# Patient Record
Sex: Female | Born: 1950 | Race: White | Hispanic: No | Smoking: Current every day smoker
Health system: Southern US, Community
[De-identification: ages and names within clinical notes are randomized; demographics above are authoritative.]

## PROBLEM LIST (undated history)

## (undated) DIAGNOSIS — J449 Chronic obstructive pulmonary disease, unspecified: Secondary | ICD-10-CM

## (undated) DIAGNOSIS — E119 Type 2 diabetes mellitus without complications: Secondary | ICD-10-CM

## (undated) DIAGNOSIS — M810 Age-related osteoporosis without current pathological fracture: Secondary | ICD-10-CM

## (undated) DIAGNOSIS — C801 Malignant (primary) neoplasm, unspecified: Secondary | ICD-10-CM

## (undated) DIAGNOSIS — I10 Essential (primary) hypertension: Secondary | ICD-10-CM

## (undated) HISTORY — PX: COLONOSCOPY: SHX174

## (undated) HISTORY — PX: BUNIONECTOMY: SHX129

## (undated) HISTORY — DX: Type 2 diabetes mellitus without complications: E11.9

## (undated) HISTORY — PX: TUBAL LIGATION: SHX77

## (undated) HISTORY — DX: Age-related osteoporosis without current pathological fracture: M81.0

## (undated) HISTORY — DX: Chronic obstructive pulmonary disease, unspecified: J44.9

## (undated) HISTORY — PX: BREAST LUMPECTOMY: SHX2

## (undated) HISTORY — DX: Essential (primary) hypertension: I10

---

## 2004-08-16 ENCOUNTER — Ambulatory Visit: Payer: Self-pay | Admitting: Internal Medicine

## 2004-08-30 ENCOUNTER — Ambulatory Visit: Payer: Self-pay | Admitting: General Surgery

## 2004-08-31 ENCOUNTER — Ambulatory Visit: Payer: Self-pay | Admitting: Internal Medicine

## 2004-10-12 ENCOUNTER — Ambulatory Visit: Payer: Self-pay | Admitting: Radiation Oncology

## 2004-12-20 ENCOUNTER — Ambulatory Visit: Payer: Self-pay | Admitting: Internal Medicine

## 2004-12-30 ENCOUNTER — Ambulatory Visit: Payer: Self-pay | Admitting: Internal Medicine

## 2005-03-21 ENCOUNTER — Ambulatory Visit: Payer: Self-pay | Admitting: General Surgery

## 2005-05-08 ENCOUNTER — Ambulatory Visit: Payer: Self-pay | Admitting: Internal Medicine

## 2005-06-01 ENCOUNTER — Ambulatory Visit: Payer: Self-pay | Admitting: Internal Medicine

## 2005-07-09 ENCOUNTER — Ambulatory Visit: Payer: Self-pay | Admitting: Family Medicine

## 2005-09-07 ENCOUNTER — Ambulatory Visit: Payer: Self-pay | Admitting: General Surgery

## 2005-10-11 ENCOUNTER — Ambulatory Visit: Payer: Self-pay | Admitting: Internal Medicine

## 2005-11-12 ENCOUNTER — Ambulatory Visit: Payer: Self-pay | Admitting: Internal Medicine

## 2005-11-29 ENCOUNTER — Ambulatory Visit: Payer: Self-pay | Admitting: Internal Medicine

## 2006-05-20 ENCOUNTER — Ambulatory Visit: Payer: Self-pay | Admitting: Internal Medicine

## 2006-06-01 ENCOUNTER — Ambulatory Visit: Payer: Self-pay | Admitting: Internal Medicine

## 2006-10-10 ENCOUNTER — Ambulatory Visit: Payer: Self-pay | Admitting: Radiation Oncology

## 2006-10-21 ENCOUNTER — Ambulatory Visit: Payer: Self-pay | Admitting: General Surgery

## 2006-11-18 ENCOUNTER — Ambulatory Visit: Payer: Self-pay | Admitting: Internal Medicine

## 2006-11-25 ENCOUNTER — Ambulatory Visit: Payer: Self-pay | Admitting: Urology

## 2006-11-30 ENCOUNTER — Ambulatory Visit: Payer: Self-pay | Admitting: Internal Medicine

## 2007-07-02 ENCOUNTER — Ambulatory Visit: Payer: Self-pay | Admitting: Internal Medicine

## 2007-07-16 ENCOUNTER — Ambulatory Visit: Payer: Self-pay | Admitting: Internal Medicine

## 2007-08-02 ENCOUNTER — Ambulatory Visit: Payer: Self-pay | Admitting: Internal Medicine

## 2007-12-10 ENCOUNTER — Ambulatory Visit: Payer: Self-pay | Admitting: Family Medicine

## 2007-12-12 ENCOUNTER — Ambulatory Visit: Payer: Self-pay | Admitting: Family Medicine

## 2007-12-16 ENCOUNTER — Ambulatory Visit: Payer: Self-pay | Admitting: Gastroenterology

## 2008-01-30 ENCOUNTER — Ambulatory Visit: Payer: Self-pay | Admitting: Internal Medicine

## 2008-02-26 ENCOUNTER — Ambulatory Visit: Payer: Self-pay | Admitting: Internal Medicine

## 2008-03-01 ENCOUNTER — Ambulatory Visit: Payer: Self-pay | Admitting: Internal Medicine

## 2008-05-06 ENCOUNTER — Ambulatory Visit: Payer: Self-pay | Admitting: Internal Medicine

## 2008-05-28 ENCOUNTER — Ambulatory Visit: Payer: Self-pay | Admitting: Family Medicine

## 2008-11-01 ENCOUNTER — Ambulatory Visit: Payer: Self-pay | Admitting: Internal Medicine

## 2008-11-25 ENCOUNTER — Ambulatory Visit: Payer: Self-pay | Admitting: Internal Medicine

## 2008-11-29 ENCOUNTER — Ambulatory Visit: Payer: Self-pay | Admitting: Internal Medicine

## 2009-01-12 ENCOUNTER — Ambulatory Visit: Payer: Self-pay | Admitting: Internal Medicine

## 2009-07-25 ENCOUNTER — Ambulatory Visit: Payer: Self-pay | Admitting: Family Medicine

## 2010-02-24 ENCOUNTER — Ambulatory Visit: Payer: Self-pay | Admitting: Family Medicine

## 2010-03-09 ENCOUNTER — Ambulatory Visit: Payer: Self-pay | Admitting: Family Medicine

## 2010-07-02 ENCOUNTER — Ambulatory Visit: Payer: Self-pay | Admitting: Family Medicine

## 2011-06-02 ENCOUNTER — Ambulatory Visit: Payer: Self-pay | Admitting: Internal Medicine

## 2011-06-13 ENCOUNTER — Ambulatory Visit: Payer: Self-pay | Admitting: Family Medicine

## 2011-07-14 ENCOUNTER — Ambulatory Visit: Payer: Self-pay

## 2011-10-15 ENCOUNTER — Ambulatory Visit: Payer: Self-pay | Admitting: Family Medicine

## 2012-01-11 ENCOUNTER — Ambulatory Visit: Payer: Self-pay | Admitting: Family Medicine

## 2013-11-10 ENCOUNTER — Ambulatory Visit: Payer: Self-pay | Admitting: Family Medicine

## 2015-05-19 ENCOUNTER — Encounter: Payer: Self-pay | Admitting: Family Medicine

## 2015-05-19 ENCOUNTER — Ambulatory Visit (INDEPENDENT_AMBULATORY_CARE_PROVIDER_SITE_OTHER): Payer: PRIVATE HEALTH INSURANCE | Admitting: Family Medicine

## 2015-05-19 VITALS — BP 136/88 | HR 80 | Ht 64.0 in | Wt 72.0 lb

## 2015-05-19 DIAGNOSIS — R7303 Prediabetes: Secondary | ICD-10-CM

## 2015-05-19 DIAGNOSIS — M810 Age-related osteoporosis without current pathological fracture: Secondary | ICD-10-CM

## 2015-05-19 DIAGNOSIS — J449 Chronic obstructive pulmonary disease, unspecified: Secondary | ICD-10-CM | POA: Diagnosis not present

## 2015-05-19 DIAGNOSIS — R7309 Other abnormal glucose: Secondary | ICD-10-CM | POA: Diagnosis not present

## 2015-05-19 DIAGNOSIS — I10 Essential (primary) hypertension: Secondary | ICD-10-CM | POA: Diagnosis not present

## 2015-05-19 MED ORDER — ALENDRONATE SODIUM 70 MG PO TABS: 70.0000 mg | ORAL_TABLET | ORAL | Status: DC

## 2015-05-19 MED ORDER — TIOTROPIUM BROMIDE MONOHYDRATE 18 MCG IN CAPS: 18.0000 ug | ORAL_CAPSULE | RESPIRATORY_TRACT | Status: DC

## 2015-05-19 MED ORDER — LISINOPRIL-HYDROCHLOROTHIAZIDE 10-12.5 MG PO TABS: 1.0000 | ORAL_TABLET | Freq: Every day | ORAL | Status: DC

## 2015-05-19 MED ORDER — METFORMIN HCL 500 MG PO TABS: 500.0000 mg | ORAL_TABLET | Freq: Every day | ORAL | Status: DC

## 2015-05-19 NOTE — Patient Instructions (Signed)
Smoking Cessation Quitting smoking is important to your health and has many advantages. However, it is not always easy to quit since nicotine is a very addictive drug. Oftentimes, people try 3 times or more before being able to quit. This document explains the best ways for you to prepare to quit smoking. Quitting takes hard work and a lot of effort, but you can do it. ADVANTAGES OF QUITTING SMOKING  You will live longer, feel better, and live better.  Your body will feel the impact of quitting smoking almost immediately.  Within 20 minutes, blood pressure decreases. Your pulse returns to its normal level.  After 8 hours, carbon monoxide levels in the blood return to normal. Your oxygen level increases.  After 24 hours, the chance of having a heart attack starts to decrease. Your breath, hair, and body stop smelling like smoke.  After 48 hours, damaged nerve endings begin to recover. Your sense of taste and smell improve.  After 72 hours, the body is virtually free of nicotine. Your bronchial tubes relax and breathing becomes easier.  After 2 to 12 weeks, lungs can hold more air. Exercise becomes easier and circulation improves.  The risk of having a heart attack, stroke, cancer, or lung disease is greatly reduced.  After 1 year, the risk of coronary heart disease is cut in half.  After 5 years, the risk of stroke falls to the same as a nonsmoker.  After 10 years, the risk of lung cancer is cut in half and the risk of other cancers decreases significantly.  After 15 years, the risk of coronary heart disease drops, usually to the level of a nonsmoker.  If you are pregnant, quitting smoking will improve your chances of having a healthy baby.  The people you live with, especially any children, will be healthier.  You will have extra money to spend on things other than cigarettes. QUESTIONS TO THINK ABOUT BEFORE ATTEMPTING TO QUIT You may want to talk about your answers with your  health care provider.  Why do you want to quit?  If you tried to quit in the past, what helped and what did not?  What will be the most difficult situations for you after you quit? How will you plan to handle them?  Who can help you through the tough times? Your family? Friends? A health care provider?  What pleasures do you get from smoking? What ways can you still get pleasure if you quit? Here are some questions to ask your health care provider:  How can you help me to be successful at quitting?  What medicine do you think would be best for me and how should I take it?  What should I do if I need more help?  What is smoking withdrawal like? How can I get information on withdrawal? GET READY  Set a quit date.  Change your environment by getting rid of all cigarettes, ashtrays, matches, and lighters in your home, car, or work. Do not let people smoke in your home.  Review your past attempts to quit. Think about what worked and what did not. GET SUPPORT AND ENCOURAGEMENT You have a better chance of being successful if you have help. You can get support in many ways.  Tell your family, friends, and coworkers that you are going to quit and need their support. Ask them not to smoke around you.  Get individual, group, or telephone counseling and support. Programs are available at local hospitals and health centers. Call   your local health department for information about programs in your area.  Spiritual beliefs and practices may help some smokers quit.  Download a "quit meter" on your computer to keep track of quit statistics, such as how long you have gone without smoking, cigarettes not smoked, and money saved.  Get a self-help book about quitting smoking and staying off tobacco. LEARN NEW SKILLS AND BEHAVIORS  Distract yourself from urges to smoke. Talk to someone, go for a walk, or occupy your time with a task.  Change your normal routine. Take a different route to work.  Drink tea instead of coffee. Eat breakfast in a different place.  Reduce your stress. Take a hot bath, exercise, or read a book.  Plan something enjoyable to do every day. Reward yourself for not smoking.  Explore interactive web-based programs that specialize in helping you quit. GET MEDICINE AND USE IT CORRECTLY Medicines can help you stop smoking and decrease the urge to smoke. Combining medicine with the above behavioral methods and support can greatly increase your chances of successfully quitting smoking.  Nicotine replacement therapy helps deliver nicotine to your body without the negative effects and risks of smoking. Nicotine replacement therapy includes nicotine gum, lozenges, inhalers, nasal sprays, and skin patches. Some may be available over-the-counter and others require a prescription.  Antidepressant medicine helps people abstain from smoking, but how this works is unknown. This medicine is available by prescription.  Nicotinic receptor partial agonist medicine simulates the effect of nicotine in your brain. This medicine is available by prescription. Ask your health care provider for advice about which medicines to use and how to use them based on your health history. Your health care provider will tell you what side effects to look out for if you choose to be on a medicine or therapy. Carefully read the information on the package. Do not use any other product containing nicotine while using a nicotine replacement product.  RELAPSE OR DIFFICULT SITUATIONS Most relapses occur within the first 3 months after quitting. Do not be discouraged if you start smoking again. Remember, most people try several times before finally quitting. You may have symptoms of withdrawal because your body is used to nicotine. You may crave cigarettes, be irritable, feel very hungry, cough often, get headaches, or have difficulty concentrating. The withdrawal symptoms are only temporary. They are strongest  when you first quit, but they will go away within 10-14 days. To reduce the chances of relapse, try to:  Avoid drinking alcohol. Drinking lowers your chances of successfully quitting.  Reduce the amount of caffeine you consume. Once you quit smoking, the amount of caffeine in your body increases and can give you symptoms, such as a rapid heartbeat, sweating, and anxiety.  Avoid smokers because they can make you want to smoke.  Do not let weight gain distract you. Many smokers will gain weight when they quit, usually less than 10 pounds. Eat a healthy diet and stay active. You can always lose the weight gained after you quit.  Find ways to improve your mood other than smoking. FOR MORE INFORMATION  www.smokefree.gov  Document Released: 09/11/2001 Document Revised: 02/01/2014 Document Reviewed: 12/27/2011 [employer] Patient Information 2015 [employer]. This information is not intended to replace advice given to you by your health care provider. Make sure you discuss any questions you have with your health care provider.  

## 2015-05-19 NOTE — Progress Notes (Signed)
Name: Lindsey Nguyen   MRN: [id number]    DOB: October 03, 1950   Date:05/19/2015       Progress Note  Subjective  Chief Complaint  Chief Complaint  Patient presents with  . Diabetes  . Hypertension  . Osteoporosis    Diabetes She presents for her follow-up diabetic visit. She has type 2 diabetes mellitus. Her disease course has been stable. Pertinent negatives for hypoglycemia include no confusion, dizziness, headaches, hunger, mood changes, nervousness/anxiousness, pallor, seizures, sleepiness, speech difficulty, sweats or tremors. Pertinent negatives for diabetes include no blurred vision, no chest pain, no fatigue, no foot paresthesias, no foot ulcerations, no polydipsia, no polyphagia, no polyuria, no visual change, no weakness and no weight loss. Pertinent negatives for hypoglycemia complications include no blackouts, no hospitalization, no nocturnal hypoglycemia, no required assistance and no required glucagon injection. Symptoms are stable. Pertinent negatives for diabetic complications include no autonomic neuropathy, CVA, heart disease, impotence, nephropathy, peripheral neuropathy, PVD or retinopathy. There are no known risk factors for coronary artery disease. Current diabetic treatment includes oral agent (monotherapy). She is compliant with treatment all of the time. Her weight is stable. When asked about meal planning, she reported none. She has not had a previous visit with a dietitian.  Hypertension Pertinent negatives include no blurred vision, chest pain, headaches, malaise/fatigue, neck pain, palpitations, shortness of breath or sweats. There is no history of CVA, PVD or retinopathy.    No problem-specific assessment & plan notes found for this encounter.   Past Medical History  Diagnosis Date  . Diabetes mellitus without complication   . Hypertension   . Osteoporosis   . COPD (chronic obstructive pulmonary disease)     Past Surgical History  Procedure Laterality Date  .  Breast lumpectomy      x 2  . Bunionectomy Right   . Tubal ligation    . Colonoscopy      normal    Family History  Problem Relation Age of Onset  . Cancer Father   . Diabetes Father   . Cancer Brother   . Diabetes Paternal Aunt   . Diabetes Paternal Uncle     Social History   Social History  . Marital Status: Divorced    Spouse Name: N/A  . Number of Children: N/A  . Years of Education: N/A   Occupational History  . Not on file.   Social History Main Topics  . Smoking status: Current Every Day Smoker  . Smokeless tobacco: Not on file  . Alcohol Use: 0.0 oz/week    0 Standard drinks or equivalent per week  . Drug Use: No  . Sexual Activity: No   Other Topics Concern  . Not on file   Social History Narrative  . No narrative on file    Allergies  Allergen Reactions  . Tetanus Toxoids      Review of Systems  Constitutional: Negative for fever, chills, weight loss, malaise/fatigue and fatigue.  HENT: Negative for ear discharge, ear pain and sore throat.   Eyes: Negative for blurred vision.  Respiratory: Negative for cough, sputum production, shortness of breath and wheezing.   Cardiovascular: Negative for chest pain, palpitations and leg swelling.  Gastrointestinal: Negative for heartburn, nausea, abdominal pain, diarrhea, constipation, blood in stool and melena.  Genitourinary: Negative for dysuria, urgency, frequency, hematuria and impotence.  Musculoskeletal: Negative for myalgias, back pain, joint pain and neck pain.  Skin: Negative for pallor and rash.  Neurological: Negative for dizziness, tingling, tremors, sensory change,  focal weakness, seizures, speech difficulty, weakness and headaches.  Endo/Heme/Allergies: Negative for environmental allergies, polydipsia and polyphagia. Does not bruise/bleed easily.  Psychiatric/Behavioral: Negative for depression, suicidal ideas and confusion. The patient is not nervous/anxious and does not have insomnia.       Objective  Filed Vitals:   05/19/15 1517  BP: 136/88  Pulse: 80  Height: 5\' 4"  (1.626 m)  Weight: 72 lb (32.659 kg)    Physical Exam  Constitutional: She is well-developed, well-nourished, and in no distress. No distress.  HENT:  Head: Normocephalic and atraumatic.  Right Ear: External ear normal.  Left Ear: External ear normal.  Nose: Nose normal.  Mouth/Throat: Oropharynx is clear and moist.  Eyes: Conjunctivae and EOM are normal. Pupils are equal, round, and reactive to light. Right eye exhibits no discharge. Left eye exhibits no discharge.  Neck: Normal range of motion. Neck supple. No JVD present. No thyromegaly present.  Cardiovascular: Normal rate, regular rhythm, normal heart sounds and intact distal pulses.  Exam reveals no gallop and no friction rub.   No murmur heard. Pulmonary/Chest: Effort normal and breath sounds normal.  Abdominal: Soft. Bowel sounds are normal. She exhibits no mass. There is no tenderness. There is no guarding.  Musculoskeletal: Normal range of motion. She exhibits no edema.  Lymphadenopathy:    She has no cervical adenopathy.  Neurological: She is alert. She has normal reflexes.  Skin: Skin is warm and dry. She is not diaphoretic.  Psychiatric: Mood and affect normal.      Assessment & Plan  Problem List Items Addressed This Visit    None    Visit Diagnoses    Essential hypertension    -  Primary    Relevant Medications    lisinopril-hydrochlorothiazide (PRINZIDE,ZESTORETIC) 10-12.5 MG per tablet    Prediabetes        Relevant Medications    metFORMIN (GLUCOPHAGE) 500 MG tablet    Osteoporosis        Relevant Medications    alendronate (FOSAMAX) 70 MG tablet    Chronic obstructive pulmonary disease, unspecified COPD, unspecified chronic bronchitis type        Relevant Medications    tiotropium (SPIRIVA HANDIHALER) 18 MCG inhalation capsule         Dr. Mac[facility]  05/19/2015

## 2015-05-20 LAB — RENAL FUNCTION PANEL: Phosphorus: 4.8 mg/dL — ABNORMAL HIGH (ref 2.5–4.5)

## 2015-05-20 LAB — HEMOGLOBIN A1C
ESTIMATED AVERAGE GLUCOSE: 128 mg/dL
Hgb A1c MFr Bld: 6.1 % — ABNORMAL HIGH (ref 4.8–5.6)

## 2015-06-08 ENCOUNTER — Other Ambulatory Visit: Payer: Self-pay

## 2015-07-01 ENCOUNTER — Encounter: Payer: Self-pay | Admitting: Family Medicine

## 2015-11-14 ENCOUNTER — Ambulatory Visit
Admission: RE | Admit: 2015-11-14 | Discharge: 2015-11-14 | Disposition: A | Discharge status: Home / Self Care | Payer: 59 | Source: Ambulatory Visit | Attending: Family Medicine | Admitting: Family Medicine

## 2015-11-14 ENCOUNTER — Encounter: Payer: Self-pay | Admitting: Family Medicine

## 2015-11-14 ENCOUNTER — Ambulatory Visit (INDEPENDENT_AMBULATORY_CARE_PROVIDER_SITE_OTHER): Payer: PRIVATE HEALTH INSURANCE | Admitting: Family Medicine

## 2015-11-14 VITALS — BP 130/88 | HR 72 | Temp 97.8°F | Ht 64.0 in | Wt 76.0 lb

## 2015-11-14 DIAGNOSIS — J40 Bronchitis, not specified as acute or chronic: Secondary | ICD-10-CM

## 2015-11-14 DIAGNOSIS — J439 Emphysema, unspecified: Secondary | ICD-10-CM | POA: Diagnosis not present

## 2015-11-14 DIAGNOSIS — R9389 Abnormal findings on diagnostic imaging of other specified body structures: Secondary | ICD-10-CM

## 2015-11-14 DIAGNOSIS — J189 Pneumonia, unspecified organism: Secondary | ICD-10-CM | POA: Diagnosis not present

## 2015-11-14 DIAGNOSIS — J449 Chronic obstructive pulmonary disease, unspecified: Secondary | ICD-10-CM | POA: Diagnosis not present

## 2015-11-14 MED ORDER — LEVOFLOXACIN 500 MG PO TABS: 500.0000 mg | ORAL_TABLET | Freq: Every day | ORAL | Status: DC

## 2015-11-14 NOTE — Progress Notes (Signed)
Name: Lindsey Nguyen   MRN: [id number]    DOB: 12-01-1950   Date:11/14/2015       Progress Note  Subjective  Chief Complaint  Chief Complaint  Patient presents with  . Hemoptysis    coughing up blood- "nearly every day, I have some"    Cough This is a new problem. The current episode started 1 to 4 weeks ago. The problem has been unchanged. The problem occurs hourly. The cough is productive of bloody sputum. Associated symptoms include chills, nasal congestion, postnasal drip, rhinorrhea and wheezing. Pertinent negatives include no chest pain, ear congestion, ear pain, fever, headaches, heartburn, hemoptysis, myalgias, rash, sore throat, shortness of breath or weight loss. She has tried ipratropium inhaler for the symptoms. The treatment provided moderate relief. Her past medical history is significant for bronchitis and COPD. There is no history of asthma, bronchiectasis, emphysema, environmental allergies or pneumonia.    No problem-specific assessment & plan notes found for this encounter.   Past Medical History  Diagnosis Date  . Diabetes mellitus without complication ([place])   . Hypertension   . Osteoporosis   . COPD (chronic obstructive pulmonary disease) [facility])     Past Surgical History  Procedure Laterality Date  . Breast lumpectomy      x 2  . Bunionectomy Right   . Tubal ligation    . Colonoscopy      normal    Family History  Problem Relation Age of Onset  . Cancer Father   . Diabetes Father   . Cancer Brother   . Diabetes Paternal Aunt   . Diabetes Paternal Uncle     Social History   Social History  . Marital Status: Divorced    Spouse Name: N/A  . Number of Children: N/A  . Years of Education: N/A   Occupational History  . Not on file.   Social History Main Topics  . Smoking status: Current Every Day Smoker  . Smokeless tobacco: Not on file  . Alcohol Use: 0.0 oz/week    0 Standard drinks or equivalent per week  . Drug Use: No  . Sexual  Activity: No   Other Topics Concern  . Not on file   Social History Narrative    Allergies  Allergen Reactions  . Tetanus Toxoids      Review of Systems  Constitutional: Positive for chills. Negative for fever, weight loss and malaise/fatigue.  HENT: Positive for postnasal drip and rhinorrhea. Negative for ear discharge, ear pain and sore throat.   Eyes: Negative for blurred vision.  Respiratory: Positive for cough and wheezing. Negative for hemoptysis, sputum production and shortness of breath.   Cardiovascular: Negative for chest pain, palpitations and leg swelling.  Gastrointestinal: Negative for heartburn, nausea, abdominal pain, diarrhea, constipation, blood in stool and melena.  Genitourinary: Negative for dysuria, urgency, frequency and hematuria.  Musculoskeletal: Negative for myalgias, back pain, joint pain and neck pain.  Skin: Negative for rash.  Neurological: Negative for dizziness, tingling, sensory change, focal weakness and headaches.  Endo/Heme/Allergies: Negative for environmental allergies and polydipsia. Does not bruise/bleed easily.  Psychiatric/Behavioral: Negative for depression and suicidal ideas. The patient is not nervous/anxious and does not have insomnia.      Objective  Filed Vitals:   11/14/15 1342  BP: 130/88  Pulse: 72  Temp: 97.8 F (36.6 C)  Height: 5\' 4"  (1.626 m)  Weight: 76 lb (34.473 kg)    Physical Exam  Constitutional: She is well-developed, well-nourished, and in no  distress. No distress.  HENT:  Head: Normocephalic and atraumatic.  Right Ear: External ear normal. A foreign body is present.  Left Ear: External ear normal. A foreign body is present.  Nose: Nose normal.  Mouth/Throat: Oropharynx is clear and moist.  Cerumen impaction  Eyes: Conjunctivae and EOM are normal. Pupils are equal, round, and reactive to light. Right eye exhibits no discharge. Left eye exhibits no discharge.  Neck: Normal range of motion. Neck supple.  No JVD present. No thyromegaly present.  Cardiovascular: Normal rate, regular rhythm, normal heart sounds and intact distal pulses.  Exam reveals no gallop and no friction rub.   No murmur heard. Pulmonary/Chest: Effort normal and breath sounds normal.  Abdominal: Soft. Bowel sounds are normal. She exhibits no mass. There is no tenderness. There is no guarding.  Musculoskeletal: Normal range of motion. She exhibits no edema.  Lymphadenopathy:    She has no cervical adenopathy.  Neurological: She is alert. She has normal reflexes.  Skin: Skin is warm and dry. She is not diaphoretic.  Psychiatric: Mood and affect normal.      Assessment & Plan  Problem List Items Addressed This Visit      Respiratory   COLD (chronic obstructive lung disease) ([place]) - Primary   Relevant Medications   levofloxacin (LEVAQUIN) 500 MG tablet   Other Relevant Orders   DG Chest 2 View (Completed)    Other Visit Diagnoses    Bronchitis        Relevant Medications    levofloxacin (LEVAQUIN) 500 MG tablet    Other Relevant Orders    DG Chest 2 View (Completed)         Dr. Mac[facility]  11/14/2015

## 2015-11-17 ENCOUNTER — Other Ambulatory Visit: Payer: Self-pay

## 2015-11-17 DIAGNOSIS — I10 Essential (primary) hypertension: Secondary | ICD-10-CM

## 2015-11-17 DIAGNOSIS — R9389 Abnormal findings on diagnostic imaging of other specified body structures: Secondary | ICD-10-CM

## 2015-11-18 ENCOUNTER — Ambulatory Visit: Payer: PRIVATE HEALTH INSURANCE

## 2015-11-18 ENCOUNTER — Other Ambulatory Visit: Payer: Self-pay | Admitting: Family Medicine

## 2015-11-18 ENCOUNTER — Other Ambulatory Visit
Admission: RE | Admit: 2015-11-18 | Discharge: 2015-11-18 | Disposition: A | Discharge status: Home / Self Care | Payer: 59 | Source: Ambulatory Visit | Attending: Family Medicine | Admitting: Family Medicine

## 2015-11-18 ENCOUNTER — Ambulatory Visit
Admission: RE | Admit: 2015-11-18 | Discharge: 2015-11-18 | Disposition: A | Discharge status: Home / Self Care | Payer: 59 | Source: Ambulatory Visit | Attending: Family Medicine | Admitting: Family Medicine

## 2015-11-18 DIAGNOSIS — E119 Type 2 diabetes mellitus without complications: Secondary | ICD-10-CM | POA: Insufficient documentation

## 2015-11-18 DIAGNOSIS — D1803 Hemangioma of intra-abdominal structures: Secondary | ICD-10-CM | POA: Insufficient documentation

## 2015-11-18 DIAGNOSIS — R938 Abnormal findings on diagnostic imaging of other specified body structures: Secondary | ICD-10-CM | POA: Diagnosis present

## 2015-11-18 DIAGNOSIS — I2510 Atherosclerotic heart disease of native coronary artery without angina pectoris: Secondary | ICD-10-CM | POA: Diagnosis not present

## 2015-11-18 DIAGNOSIS — I10 Essential (primary) hypertension: Secondary | ICD-10-CM | POA: Insufficient documentation

## 2015-11-18 DIAGNOSIS — R9389 Abnormal findings on diagnostic imaging of other specified body structures: Secondary | ICD-10-CM

## 2015-11-18 LAB — RENAL FUNCTION PANEL
Albumin: 4.6 g/dL (ref 3.5–5.0)
Anion gap: 10 (ref 5–15)
BUN: 22 mg/dL — ABNORMAL HIGH (ref 6–20)
CHLORIDE: 92 mmol/L — AB (ref 101–111)
CO2: 29 mmol/L (ref 22–32)
Calcium: 9.9 mg/dL (ref 8.9–10.3)
Creatinine, Ser: 0.76 mg/dL (ref 0.44–1.00)
GFR calc non Af Amer: >60 mL/min (ref >60)
GLUCOSE: 105 mg/dL — AB (ref 65–99)
PHOSPHORUS: 4.3 mg/dL (ref 2.5–4.6)
Potassium: 4.2 mmol/L (ref 3.5–5.1)
SODIUM: 131 mmol/L — AB (ref 135–145)

## 2015-11-18 LAB — LIPID PANEL
Cholesterol: 289 mg/dL — ABNORMAL HIGH (ref 0–200)
HDL: 109 mg/dL (ref >40)
LDL CALC: 142 mg/dL — AB (ref 0–99)
Total CHOL/HDL Ratio: 2.7 RATIO
Triglycerides: 192 mg/dL — ABNORMAL HIGH (ref <150)
VLDL: 38 mg/dL (ref 0–40)

## 2015-11-18 LAB — HEMOGLOBIN A1C: Hgb A1c MFr Bld: 5.3 % (ref 4.0–6.0)

## 2015-11-21 ENCOUNTER — Encounter: Payer: Self-pay | Admitting: Family Medicine

## 2015-11-21 ENCOUNTER — Ambulatory Visit (INDEPENDENT_AMBULATORY_CARE_PROVIDER_SITE_OTHER): Payer: PRIVATE HEALTH INSURANCE | Admitting: Family Medicine

## 2015-11-21 ENCOUNTER — Ambulatory Visit: Payer: PRIVATE HEALTH INSURANCE

## 2015-11-21 VITALS — BP 138/68 | HR 98 | Ht 64.0 in | Wt 76.0 lb

## 2015-11-21 DIAGNOSIS — J432 Centrilobular emphysema: Secondary | ICD-10-CM | POA: Diagnosis not present

## 2015-11-21 DIAGNOSIS — J40 Bronchitis, not specified as acute or chronic: Secondary | ICD-10-CM | POA: Diagnosis not present

## 2015-11-21 DIAGNOSIS — J449 Chronic obstructive pulmonary disease, unspecified: Secondary | ICD-10-CM

## 2015-11-21 MED ORDER — LEVOFLOXACIN 500 MG PO TABS: 500.0000 mg | ORAL_TABLET | Freq: Every day | ORAL | Status: DC

## 2015-11-21 MED ORDER — FLUTICASONE FUROATE-VILANTEROL 200-25 MCG/INH IN AEPB: 1.0000 | INHALATION_SPRAY | Freq: Every day | RESPIRATORY_TRACT | Status: DC

## 2015-11-21 MED ORDER — ALBUTEROL SULFATE HFA 108 (90 BASE) MCG/ACT IN AERS
2.0000 | INHALATION_SPRAY | Freq: Four times a day (QID) | RESPIRATORY_TRACT | Status: DC | PRN
Start: 2015-11-21 — End: 2016-11-22

## 2015-11-21 NOTE — Progress Notes (Signed)
Name: Lindsey Nguyen   MRN: [id number]    DOB: 09-Mar-1951   Date:11/21/2015       Progress Note  Subjective  Chief Complaint  Chief Complaint  Patient presents with  . Follow-up    Shortness of Breath This is a chronic problem. The current episode started more than 1 year ago. The problem occurs daily. The problem has been gradually improving. Pertinent negatives include no abdominal pain, chest pain, claudication, coryza, ear pain, fever, headaches, hemoptysis, leg pain, leg swelling, neck pain, orthopnea, PND, rash, rhinorrhea, sore throat, sputum production, swollen glands, syncope, vomiting or wheezing. Her past medical history is significant for COPD. There is no history of allergies, aspirin allergies, asthma, bronchiolitis, CAD, DVT, a heart failure, PE, pneumonia or a recent surgery.    No problem-specific assessment & plan notes found for this encounter.   Past Medical History  Diagnosis Date  . Diabetes mellitus without complication ([place])   . Hypertension   . Osteoporosis   . COPD (chronic obstructive pulmonary disease) [facility])     Past Surgical History  Procedure Laterality Date  . Breast lumpectomy      x 2  . Bunionectomy Right   . Tubal ligation    . Colonoscopy      normal    Family History  Problem Relation Age of Onset  . Cancer Father   . Diabetes Father   . Cancer Brother   . Diabetes Paternal Aunt   . Diabetes Paternal Uncle     Social History   Social History  . Marital Status: Divorced    Spouse Name: N/A  . Number of Children: N/A  . Years of Education: N/A   Occupational History  . Not on file.   Social History Main Topics  . Smoking status: Current Every Day Smoker  . Smokeless tobacco: Not on file  . Alcohol Use: 0.0 oz/week    0 Standard drinks or equivalent per week  . Drug Use: No  . Sexual Activity: No   Other Topics Concern  . Not on file   Social History Narrative    Allergies  Allergen Reactions  . Tetanus Toxoids       Review of Systems  Constitutional: Negative for fever, chills, weight loss and malaise/fatigue.  HENT: Negative for ear discharge, ear pain, rhinorrhea and sore throat.   Eyes: Negative for blurred vision.  Respiratory: Positive for shortness of breath. Negative for cough, hemoptysis, sputum production and wheezing.   Cardiovascular: Negative for chest pain, palpitations, orthopnea, claudication, leg swelling, syncope and PND.  Gastrointestinal: Negative for heartburn, nausea, vomiting, abdominal pain, diarrhea, constipation, blood in stool and melena.  Genitourinary: Negative for dysuria, urgency, frequency and hematuria.  Musculoskeletal: Negative for myalgias, back pain, joint pain and neck pain.  Skin: Negative for rash.  Neurological: Negative for dizziness, tingling, sensory change, focal weakness and headaches.  Endo/Heme/Allergies: Negative for environmental allergies and polydipsia. Does not bruise/bleed easily.  Psychiatric/Behavioral: Negative for depression and suicidal ideas. The patient is not nervous/anxious and does not have insomnia.      Objective  Filed Vitals:   11/21/15 1341  BP: 138/68  Pulse: 98  Height: 5\' 4"  (1.626 m)  Weight: 76 lb (34.473 kg)    Physical Exam  Constitutional: She is well-developed, well-nourished, and in no distress. No distress.  HENT:  Head: Normocephalic and atraumatic.  Right Ear: External ear normal.  Left Ear: External ear normal.  Nose: Nose normal.  Mouth/Throat: Oropharynx is clear  and moist.  Eyes: Conjunctivae and EOM are normal. Pupils are equal, round, and reactive to light. Right eye exhibits no discharge. Left eye exhibits no discharge.  Neck: Normal range of motion. Neck supple. No JVD present. No thyromegaly present.  Cardiovascular: Normal rate, regular rhythm, normal heart sounds and intact distal pulses.  Exam reveals no gallop and no friction rub.   No murmur heard. Pulmonary/Chest: Effort normal and breath  sounds normal.  Abdominal: Soft. Bowel sounds are normal. She exhibits no mass. There is no tenderness. There is no guarding.  Musculoskeletal: Normal range of motion. She exhibits no edema.  Lymphadenopathy:    She has no cervical adenopathy.  Neurological: She is alert. She has normal reflexes.  Skin: Skin is warm and dry. She is not diaphoretic.  Psychiatric: Mood and affect normal.  Nursing note and vitals reviewed.     Assessment & Plan  Problem List Items Addressed This Visit      Respiratory   COLD (chronic obstructive lung disease) ([facility]) - Primary   Relevant Medications   fluticasone furoate-vilanterol (BREO ELLIPTA) 200-25 MCG/INH AEPB   levofloxacin (LEVAQUIN) 500 MG tablet   albuterol (PROVENTIL HFA;VENTOLIN HFA) 108 (90 Base) MCG/ACT inhaler   Other Relevant Orders   Ambulatory referral to Pulmonology    Other Visit Diagnoses    Bronchitis        Relevant Medications    levofloxacin (LEVAQUIN) 500 MG tablet         Dr. Torrance [facility]  11/21/2015

## 2015-12-19 ENCOUNTER — Ambulatory Visit: Payer: Self-pay | Admitting: Family Medicine

## 2016-02-20 ENCOUNTER — Other Ambulatory Visit: Payer: Self-pay

## 2016-02-20 DIAGNOSIS — M810 Age-related osteoporosis without current pathological fracture: Secondary | ICD-10-CM

## 2016-02-20 DIAGNOSIS — I10 Essential (primary) hypertension: Secondary | ICD-10-CM

## 2016-02-20 DIAGNOSIS — R7303 Prediabetes: Secondary | ICD-10-CM

## 2016-02-20 MED ORDER — LISINOPRIL-HYDROCHLOROTHIAZIDE 10-12.5 MG PO TABS: 1.0000 | ORAL_TABLET | Freq: Every day | ORAL | Status: DC

## 2016-02-20 MED ORDER — ALENDRONATE SODIUM 70 MG PO TABS: 70.0000 mg | ORAL_TABLET | ORAL | Status: DC

## 2016-02-20 MED ORDER — METFORMIN HCL 500 MG PO TABS: 500.0000 mg | ORAL_TABLET | Freq: Every day | ORAL | Status: DC

## 2016-02-28 ENCOUNTER — Encounter: Payer: Self-pay | Admitting: Family Medicine

## 2016-02-28 ENCOUNTER — Ambulatory Visit
Admission: RE | Admit: 2016-02-28 | Discharge: 2016-02-28 | Disposition: A | Discharge status: Home / Self Care | Payer: 59 | Source: Ambulatory Visit | Attending: Family Medicine | Admitting: Family Medicine

## 2016-02-28 ENCOUNTER — Ambulatory Visit (INDEPENDENT_AMBULATORY_CARE_PROVIDER_SITE_OTHER): Payer: PRIVATE HEALTH INSURANCE | Admitting: Family Medicine

## 2016-02-28 VITALS — BP 120/80 | HR 104 | Ht 64.0 in | Wt 73.0 lb

## 2016-02-28 DIAGNOSIS — J189 Pneumonia, unspecified organism: Secondary | ICD-10-CM | POA: Diagnosis not present

## 2016-02-28 DIAGNOSIS — R932 Abnormal findings on diagnostic imaging of liver and biliary tract: Secondary | ICD-10-CM | POA: Diagnosis not present

## 2016-02-28 DIAGNOSIS — J449 Chronic obstructive pulmonary disease, unspecified: Secondary | ICD-10-CM | POA: Diagnosis not present

## 2016-02-28 DIAGNOSIS — J40 Bronchitis, not specified as acute or chronic: Secondary | ICD-10-CM | POA: Diagnosis not present

## 2016-02-28 DIAGNOSIS — J432 Centrilobular emphysema: Secondary | ICD-10-CM | POA: Diagnosis not present

## 2016-02-28 DIAGNOSIS — R634 Abnormal weight loss: Secondary | ICD-10-CM | POA: Diagnosis not present

## 2016-02-28 MED ORDER — LEVOFLOXACIN 500 MG PO TABS: 500.0000 mg | ORAL_TABLET | Freq: Every day | ORAL | Status: DC

## 2016-02-28 NOTE — Progress Notes (Signed)
Name: Lindsey Nguyen   MRN: [id number]    DOB: 1951-08-14   Date:02/28/2016       Progress Note  Subjective  Chief Complaint  Chief Complaint  Patient presents with  . Bronchitis    cough with production, congestion- worse in am    HPI  No problem-specific assessment & plan notes found for this encounter.   Past Medical History  Diagnosis Date  . Diabetes mellitus without complication ([place])   . Hypertension   . Osteoporosis   . COPD (chronic obstructive pulmonary disease) [facility])     Past Surgical History  Procedure Laterality Date  . Breast lumpectomy      x 2  . Bunionectomy Right   . Tubal ligation    . Colonoscopy      normal    Family History  Problem Relation Age of Onset  . Cancer Father   . Diabetes Father   . Cancer Brother   . Diabetes Paternal Aunt   . Diabetes Paternal Uncle     Social History   Social History  . Marital Status: Divorced    Spouse Name: N/A  . Number of Children: N/A  . Years of Education: N/A   Occupational History  . Not on file.   Social History Main Topics  . Smoking status: Current Every Day Smoker  . Smokeless tobacco: Not on file  . Alcohol Use: 0.0 oz/week    0 Standard drinks or equivalent per week  . Drug Use: No  . Sexual Activity: No   Other Topics Concern  . Not on file   Social History Narrative    Allergies  Allergen Reactions  . Tetanus Toxoids      Review of Systems  Constitutional: Positive for weight loss. Negative for fever, chills and malaise/fatigue.  HENT: Negative for ear discharge, ear pain and sore throat.   Eyes: Negative for blurred vision.  Respiratory: Positive for cough and hemoptysis. Negative for sputum production, shortness of breath and wheezing.   Cardiovascular: Negative for chest pain, palpitations and leg swelling.  Gastrointestinal: Negative for heartburn, nausea, abdominal pain, diarrhea, constipation, blood in stool and melena.       Bloated  Genitourinary: Negative  for dysuria, urgency, frequency and hematuria.  Musculoskeletal: Negative for myalgias, back pain, joint pain and neck pain.  Skin: Negative for rash.  Neurological: Negative for dizziness, tingling, sensory change, focal weakness and headaches.  Endo/Heme/Allergies: Negative for environmental allergies and polydipsia. Does not bruise/bleed easily.  Psychiatric/Behavioral: Negative for depression and suicidal ideas. The patient is not nervous/anxious and does not have insomnia.      Objective  Filed Vitals:   02/28/16 1335  BP: 120/80  Pulse: 104  Height: 5\' 4"  (1.626 m)  Weight: 73 lb (33.113 kg)  SpO2: 94%    Physical Exam  Constitutional: She is well-developed, well-nourished, and in no distress. No distress.  HENT:  Head: Normocephalic and atraumatic.  Right Ear: External ear normal.  Left Ear: External ear normal.  Nose: Nose normal.  Mouth/Throat: Oropharynx is clear and moist.  Eyes: Conjunctivae and EOM are normal. Pupils are equal, round, and reactive to light. Right eye exhibits no discharge. Left eye exhibits no discharge.  Neck: Normal range of motion. Neck supple. No JVD present. No thyromegaly present.  Cardiovascular: Normal rate, regular rhythm, normal heart sounds and intact distal pulses.  Exam reveals no gallop and no friction rub.   No murmur heard. Pulmonary/Chest: Effort normal and breath sounds normal.  Abdominal: Soft. Bowel sounds are normal. She exhibits no mass. There is no tenderness. There is no guarding.  Musculoskeletal: Normal range of motion. She exhibits no edema.  Lymphadenopathy:    She has no cervical adenopathy.  Neurological: She is alert. She has normal reflexes.  Skin: Skin is warm and dry. She is not diaphoretic.  Psychiatric: Mood and affect normal.  Nursing note and vitals reviewed.     Assessment & Plan  Problem List Items Addressed This Visit      Respiratory   COLD (chronic obstructive lung disease) (HCC)   Relevant  Medications   levofloxacin (LEVAQUIN) 500 MG tablet     Other   Excessive weight loss   Relevant Orders   CT Abdomen Pelvis W Contrast   Renal Function Panel    Other Visit Diagnoses    Pneumonia, unspecified laterality, unspecified part of lung    -  Primary    Relevant Medications    levofloxacin (LEVAQUIN) 500 MG tablet    Other Relevant Orders    CBC    DG Chest 2 View    Abnormal CT scan, liver        Relevant Orders    CT Abdomen Pelvis W Contrast    Bronchitis        Relevant Medications    levofloxacin (LEVAQUIN) 500 MG tablet         Dr. [facility]  02/28/2016

## 2016-02-29 LAB — RENAL FUNCTION PANEL
Albumin: 4.7 g/dL (ref 3.6–4.8)
BUN / CREAT RATIO: 24 (ref 12–28)
BUN: 17 mg/dL (ref 8–27)
CALCIUM: 9.9 mg/dL (ref 8.7–10.3)
CO2: 25 mmol/L (ref 18–29)
CREATININE: 0.7 mg/dL (ref 0.57–1.00)
Chloride: 82 mmol/L — ABNORMAL LOW (ref 96–106)
GFR, EST AFRICAN AMERICAN: 107 mL/min/{1.73_m2} (ref >59)
GFR, EST NON AFRICAN AMERICAN: 93 mL/min/{1.73_m2} (ref >59)
Glucose: 108 mg/dL — ABNORMAL HIGH (ref 65–99)
Phosphorus: 3.3 mg/dL (ref 2.5–4.5)
Potassium: 4.2 mmol/L (ref 3.5–5.2)
SODIUM: 131 mmol/L — AB (ref 134–144)

## 2016-02-29 LAB — CBC
HEMATOCRIT: 38.8 % (ref 34.0–46.6)
Hemoglobin: 13.7 g/dL (ref 11.1–15.9)
MCH: 32.6 pg (ref 26.6–33.0)
MCHC: 35.3 g/dL (ref 31.5–35.7)
MCV: 92 fL (ref 79–97)
PLATELETS: 321 10*3/uL (ref 150–379)
RBC: 4.2 x10E6/uL (ref 3.77–5.28)
RDW: 14.1 % (ref 12.3–15.4)
WBC: 10.2 10*3/uL (ref 3.4–10.8)

## 2016-03-05 ENCOUNTER — Ambulatory Visit: Payer: PRIVATE HEALTH INSURANCE

## 2016-03-06 ENCOUNTER — Ambulatory Visit: Payer: PRIVATE HEALTH INSURANCE

## 2016-03-13 ENCOUNTER — Ambulatory Visit
Admission: RE | Admit: 2016-03-13 | Discharge: 2016-03-13 | Disposition: A | Discharge status: Home / Self Care | Payer: 59 | Source: Ambulatory Visit | Attending: Family Medicine | Admitting: Family Medicine

## 2016-03-13 DIAGNOSIS — R634 Abnormal weight loss: Secondary | ICD-10-CM | POA: Insufficient documentation

## 2016-03-13 DIAGNOSIS — R932 Abnormal findings on diagnostic imaging of liver and biliary tract: Secondary | ICD-10-CM | POA: Insufficient documentation

## 2016-03-13 HISTORY — DX: Malignant (primary) neoplasm, unspecified: C80.1

## 2016-03-13 MED ORDER — IOPAMIDOL (ISOVUE-300) INJECTION 61%
75.0000 mL | Freq: Once | INTRAVENOUS | Status: AC | PRN
  Administered 2016-03-13: 60 mL via INTRAVENOUS

## 2016-03-16 ENCOUNTER — Other Ambulatory Visit: Payer: Self-pay

## 2016-03-16 DIAGNOSIS — N280 Ischemia and infarction of kidney: Secondary | ICD-10-CM

## 2016-03-28 ENCOUNTER — Other Ambulatory Visit: Payer: Self-pay | Admitting: Vascular Surgery

## 2016-04-10 ENCOUNTER — Encounter
Admission: RE | Disposition: A | Discharge status: Home / Self Care | Payer: Self-pay | Source: Ambulatory Visit | Attending: Vascular Surgery

## 2016-04-10 ENCOUNTER — Ambulatory Visit
Admission: RE | Admit: 2016-04-10 | Discharge: 2016-04-10 | Disposition: A | Discharge status: Home / Self Care | Payer: 59 | Source: Ambulatory Visit | Attending: Vascular Surgery | Admitting: Vascular Surgery

## 2016-04-10 DIAGNOSIS — I700 Atherosclerosis of aorta: Secondary | ICD-10-CM | POA: Diagnosis not present

## 2016-04-10 DIAGNOSIS — Z833 Family history of diabetes mellitus: Secondary | ICD-10-CM | POA: Insufficient documentation

## 2016-04-10 DIAGNOSIS — I10 Essential (primary) hypertension: Secondary | ICD-10-CM | POA: Diagnosis not present

## 2016-04-10 DIAGNOSIS — E119 Type 2 diabetes mellitus without complications: Secondary | ICD-10-CM | POA: Insufficient documentation

## 2016-04-10 DIAGNOSIS — F17210 Nicotine dependence, cigarettes, uncomplicated: Secondary | ICD-10-CM | POA: Insufficient documentation

## 2016-04-10 DIAGNOSIS — F1010 Alcohol abuse, uncomplicated: Secondary | ICD-10-CM | POA: Insufficient documentation

## 2016-04-10 DIAGNOSIS — Z836 Family history of other diseases of the respiratory system: Secondary | ICD-10-CM | POA: Diagnosis not present

## 2016-04-10 DIAGNOSIS — J449 Chronic obstructive pulmonary disease, unspecified: Secondary | ICD-10-CM | POA: Diagnosis not present

## 2016-04-10 DIAGNOSIS — Z8051 Family history of malignant neoplasm of kidney: Secondary | ICD-10-CM | POA: Diagnosis not present

## 2016-04-10 DIAGNOSIS — R062 Wheezing: Secondary | ICD-10-CM | POA: Diagnosis not present

## 2016-04-10 DIAGNOSIS — Z887 Allergy status to serum and vaccine status: Secondary | ICD-10-CM | POA: Insufficient documentation

## 2016-04-10 DIAGNOSIS — Z8249 Family history of ischemic heart disease and other diseases of the circulatory system: Secondary | ICD-10-CM | POA: Insufficient documentation

## 2016-04-10 DIAGNOSIS — I70213 Atherosclerosis of native arteries of extremities with intermittent claudication, bilateral legs: Secondary | ICD-10-CM | POA: Diagnosis present

## 2016-04-10 DIAGNOSIS — I739 Peripheral vascular disease, unspecified: Secondary | ICD-10-CM | POA: Insufficient documentation

## 2016-04-10 DIAGNOSIS — Z841 Family history of disorders of kidney and ureter: Secondary | ICD-10-CM | POA: Diagnosis not present

## 2016-04-10 DIAGNOSIS — C50919 Malignant neoplasm of unspecified site of unspecified female breast: Secondary | ICD-10-CM | POA: Insufficient documentation

## 2016-04-10 DIAGNOSIS — Z7984 Long term (current) use of oral hypoglycemic drugs: Secondary | ICD-10-CM | POA: Diagnosis not present

## 2016-04-10 HISTORY — PX: PERIPHERAL VASCULAR CATHETERIZATION: SHX172C

## 2016-04-10 LAB — CREATININE, SERUM
CREATININE: 0.65 mg/dL (ref 0.44–1.00)
GFR calc Af Amer: >60 mL/min (ref >60)
GFR calc non Af Amer: >60 mL/min (ref >60)

## 2016-04-10 LAB — GLUCOSE, CAPILLARY: GLUCOSE-CAPILLARY: 82 mg/dL (ref 65–99)

## 2016-04-10 LAB — BUN: BUN: 13 mg/dL (ref 6–20)

## 2016-04-10 SURGERY — LOWER EXTREMITY ANGIOGRAPHY
Anesthesia: Moderate Sedation

## 2016-04-10 MED ORDER — IOPAMIDOL (ISOVUE-300) INJECTION 61%
INTRAVENOUS | Status: DC | PRN
  Administered 2016-04-10: 75 mL via INTRA_ARTERIAL

## 2016-04-10 MED ORDER — LIDOCAINE-EPINEPHRINE (PF) 1 %-1:200000 IJ SOLN
INTRAMUSCULAR | Status: AC
  Filled 2016-04-10: qty 30

## 2016-04-10 MED ORDER — FENTANYL CITRATE (PF) 100 MCG/2ML IJ SOLN
INTRAMUSCULAR | Status: DC | PRN
  Administered 2016-04-10: 50 ug via INTRAVENOUS
  Administered 2016-04-10 (×2): 25 ug via INTRAVENOUS

## 2016-04-10 MED ORDER — HEPARIN SODIUM (PORCINE) 1000 UNIT/ML IJ SOLN
INTRAMUSCULAR | Status: AC
  Filled 2016-04-10: qty 1

## 2016-04-10 MED ORDER — FENTANYL CITRATE (PF) 100 MCG/2ML IJ SOLN
INTRAMUSCULAR | Status: AC
  Filled 2016-04-10: qty 4

## 2016-04-10 MED ORDER — ONDANSETRON HCL 4 MG/2ML IJ SOLN: 4.0000 mg | Freq: Four times a day (QID) | INTRAMUSCULAR | Status: DC | PRN

## 2016-04-10 MED ORDER — FAMOTIDINE 20 MG PO TABS: 40.0000 mg | ORAL_TABLET | ORAL | Status: DC | PRN

## 2016-04-10 MED ORDER — SODIUM CHLORIDE 0.9 % IV SOLN: INTRAVENOUS | Status: DC

## 2016-04-10 MED ORDER — HEPARIN (PORCINE) IN NACL 2-0.9 UNIT/ML-% IJ SOLN
INTRAMUSCULAR | Status: AC
  Filled 2016-04-10: qty 1000

## 2016-04-10 MED ORDER — MIDAZOLAM HCL 2 MG/2ML IJ SOLN
INTRAMUSCULAR | Status: DC | PRN
  Administered 2016-04-10 (×4): 1 mg via INTRAVENOUS

## 2016-04-10 MED ORDER — MIDAZOLAM HCL 5 MG/5ML IJ SOLN
INTRAMUSCULAR | Status: AC
  Filled 2016-04-10: qty 5

## 2016-04-10 MED ORDER — METHYLPREDNISOLONE SODIUM SUCC 125 MG IJ SOLR: 125.0000 mg | INTRAMUSCULAR | Status: DC | PRN

## 2016-04-10 MED ORDER — LIDOCAINE HCL (PF) 1 % IJ SOLN
INTRAMUSCULAR | Status: AC
  Filled 2016-04-10: qty 30

## 2016-04-10 MED ORDER — DEXTROSE 5 % IV SOLN
1.5000 g | INTRAVENOUS | Status: AC
  Administered 2016-04-10: 1.5 g via INTRAVENOUS

## 2016-04-10 MED ORDER — HYDROMORPHONE HCL 1 MG/ML IJ SOLN: 1.0000 mg | Freq: Once | INTRAMUSCULAR | Status: DC

## 2016-04-10 SURGICAL SUPPLY — 10 items
CATH PIG 70CM (CATHETERS) ×4 IMPLANT
CATH RIM 65CM (CATHETERS) ×4 IMPLANT
DEVICE CLOSURE MYNXGRIP 5F (Vascular Products) ×4 IMPLANT
GUIDEWIRE ANGLED .035 180CM (WIRE) ×4 IMPLANT
PACK ANGIOGRAPHY (CUSTOM PROCEDURE TRAY) ×4 IMPLANT
SET INTRO CAPELLA COAXIAL (SET/KITS/TRAYS/PACK) ×4 IMPLANT
SHEATH BRITE TIP 5FRX11 (SHEATH) ×4 IMPLANT
SYR MEDRAD MARK V 150ML (SYRINGE) ×4 IMPLANT
TUBING CONTRAST HIGH PRESS 72 (TUBING) ×4 IMPLANT
WIRE J 3MM .035X145CM (WIRE) ×4 IMPLANT

## 2016-04-10 NOTE — Progress Notes (Signed)
PAD applied to right groin with 40cc of air.

## 2016-04-10 NOTE — Op Note (Signed)
[facility]  Percutaneous Study/Intervention Procedural Note   Date of Surgery: 04/10/2016,11:52 AM  Surgeon:Kaysie Michelini, Dolores Lory   Pre-operative Diagnosis: Atherosclerotic occlusive disease bilateral lower extremities with lifestyle limiting claudication  Post-operative diagnosis:  Same  Procedure(s) Performed:  1.  Abdominal aortogram  2.  Bilateral lower extremity distal runoff second order catheter placement  3.  Closure right common femoral    Anesthesia: Conscious sedation was administered under my direct supervision. IV Versed plus fentanyl were utilized. Continuous ECG, pulse oximetry and blood pressure was monitored throughout the entire procedure. Conscious sedation was administered for a total of 40 minutes.  Sheath: 5 French sheath right common femoral  Contrast: 75 cc   Fluoroscopy Time: 6.2 minutes  Indications:  The patient has lifestyle limiting claudication which has been identified by CT angiogram is therefore undergoing angiography for the possibility of intervention or planning for open reconstruction. The risks and benefits of been reviewed patient has agreed to proceed  Procedure:  Lindsey Nguyen a 65 y.o. female who was identified and appropriate procedural time out was performed.  The patient was then placed supine on the table and prepped and draped in the usual sterile fashion.  Ultrasound was used to evaluate the right common femoral artery.  It was patent .  A digital ultrasound image was acquired.  Amicropuncture needle was used to access the right common femoral artery under direct ultrasound guidance and a permanent image wassaved for the record.microwire was then advanced under fluoroscopic guidance followed by micro-sheath.  A 0.035 J wire was advanced without resistance and a 5Fr sheath was placed.    Pigtail catheter was then advanced to the level of T12 and AP projection of the aorta was obtained. Pigtail catheter was then  repositioned to above the bifurcation and bilateral oblique views of the pelvis was obtained. Stiff angled Glidewire and rim catheter was then used across the bifurcation and the catheter was positioned in the distal external iliac artery.  RAO view of the pelvis was then obtained followed by an LAO view of the groin.  Distal runoff was then performed.  After review of the images the catheter was removed over wire and an RAO view of the groin was obtained. StarClose device was deployed without difficulty.  Findings:   Aortogram:  The aorta has a greater than 90% stenosis located midway between the renal arteries and the aortic bifurcation. The bifurcation and the common iliacs appear to be relatively spared without evidence of hemodynamically significant stenosis. The external iliac arteries are noted to have 30-40% stenosis in their mid portions bilaterally. This does not appear to be flow limiting. Multiple views AP as well as bilateral oblique had been obtained.  Right Lower Extremity:  The right common femoral, profunda femoris, superficial femoral and popliteal are patent. Trifurcation is patent and there is three-vessel runoff to the level of the ankle  Left Lower Extremity:  The left common femoral, profunda femoris, superficial femoral and popliteal arteries are patent. The trifurcation is patent and there is three-vessel runoff to the level of the ankle  Disposition: Patient was taken to the recovery room in stable condition having tolerated the procedure well.   SUMMARY:  The predominant lesion appears to be a focal mid infrarenal aortic stenosis. Given her age and consideration for endarterectomy or aortobiiliac bypass must be considered. These would likely be more durable reconstructions then attempting to stent what appears to be a densely calcified lesion on CT angiography. The external  iliac arteries on angiography in multiple views appeared to be patent with less than 50% stenosis along  their course. This does appear to contradict the CT angiogram which demonstrates significantly more plaque. Common femorals are both patent as are the distal runoff bilaterally. Options will be discussed with the patient in the office   Lindsey Nguyen, Dolores Lory 04/10/2016,11:52 AM

## 2016-04-10 NOTE — Discharge Instructions (Signed)
Angiogram, Care After °Refer to this sheet in the next few weeks. These instructions provide you with information about caring for yourself after your procedure. Your health care provider may also give you more specific instructions. Your treatment has been planned according to current medical practices, but problems sometimes occur. Call your health care provider if you have any problems or questions after your procedure. °WHAT TO EXPECT AFTER THE PROCEDURE °After your procedure, it is typical to have the following: °· Bruising at the catheter insertion site that usually fades within 1-2 weeks. °· Blood collecting in the tissue (hematoma) that may be painful to the touch. It should usually decrease in size and tenderness within 1-2 weeks. °HOME CARE INSTRUCTIONS °· Take medicines only as directed by your health care provider. °· You may shower 24-48 hours after the procedure or as directed by your health care provider. Remove the bandage (dressing) and gently wash the site with plain soap and water. Pat the area dry with a clean towel. Do not rub the site, because this may cause bleeding. °· Do not take baths, swim, or use a hot tub until your health care provider approves. °· Check your insertion site every day for redness, swelling, or drainage. °· Do not apply powder or lotion to the site. °· Do not lift over 10 lb (4.5 kg) for 5 days after your procedure or as directed by your health care provider. °· Ask your health care provider when it is okay to: °¨ Return to work or school. °¨ Resume usual physical activities or sports. °¨ Resume sexual activity. °· Do not drive home if you are discharged the same day as the procedure. Have someone else drive you. °· You may drive 24 hours after the procedure unless otherwise instructed by your health care provider. °· Do not operate machinery or power tools for 24 hours after the procedure or as directed by your health care provider. °· If your procedure was done as an  outpatient procedure, which means that you went home the same day as your procedure, a responsible adult should be with you for the first 24 hours after you arrive home. °· Keep all follow-up visits as directed by your health care provider. This is important. °SEEK MEDICAL CARE IF: °· You have a fever. °· You have chills. °· You have increased bleeding from the catheter insertion site. Hold pressure on the site. °SEEK IMMEDIATE MEDICAL CARE IF: °· You have unusual pain at the catheter insertion site. °· You have redness, warmth, or swelling at the catheter insertion site. °· You have drainage (other than a small amount of blood on the dressing) from the catheter insertion site. °· The catheter insertion site is bleeding, and the bleeding does not stop after 30 minutes of holding steady pressure on the site. °· The area near or just beyond the catheter insertion site becomes pale, cool, tingly, or numb. °  °This information is not intended to replace advice given to you by your health care provider. Make sure you discuss any questions you have with your health care provider. °  °Document Released: 04/05/2005 Document Revised: 10/08/2014 Document Reviewed: 02/18/2013 °Elsevier Interactive Patient Education ©2016 [employer] ° °

## 2016-04-10 NOTE — Progress Notes (Signed)
20ccs of air removed from PAD

## 2016-04-10 NOTE — Progress Notes (Signed)
20cc of air removed from PAD

## 2016-04-10 NOTE — H&P (Signed)
[facility] History & Physical Update  The patient was interviewed and re-examined.  The patient's previous History and Physical has been reviewed and is unchanged.  There is no change in the plan of care. We plan to proceed with the scheduled procedure.  Hansel Devan, Dolores Lory, MD  04/10/2016, 10:43 AM

## 2016-04-11 ENCOUNTER — Encounter: Payer: Self-pay | Admitting: Vascular Surgery

## 2016-05-08 ENCOUNTER — Ambulatory Visit (INDEPENDENT_AMBULATORY_CARE_PROVIDER_SITE_OTHER): Payer: PRIVATE HEALTH INSURANCE | Admitting: Family Medicine

## 2016-05-08 ENCOUNTER — Encounter: Payer: Self-pay | Admitting: Family Medicine

## 2016-05-08 VITALS — BP 124/70 | HR 80 | Ht 64.0 in | Wt 74.0 lb

## 2016-05-08 DIAGNOSIS — E119 Type 2 diabetes mellitus without complications: Secondary | ICD-10-CM | POA: Diagnosis not present

## 2016-05-08 DIAGNOSIS — J301 Allergic rhinitis due to pollen: Secondary | ICD-10-CM | POA: Diagnosis not present

## 2016-05-08 DIAGNOSIS — I700 Atherosclerosis of aorta: Secondary | ICD-10-CM | POA: Diagnosis not present

## 2016-05-08 DIAGNOSIS — R7303 Prediabetes: Secondary | ICD-10-CM | POA: Diagnosis not present

## 2016-05-08 DIAGNOSIS — I10 Essential (primary) hypertension: Secondary | ICD-10-CM | POA: Diagnosis not present

## 2016-05-08 DIAGNOSIS — M810 Age-related osteoporosis without current pathological fracture: Secondary | ICD-10-CM

## 2016-05-08 MED ORDER — ALENDRONATE SODIUM 70 MG PO TABS: 70.0000 mg | ORAL_TABLET | ORAL | 0 refills | Status: DC

## 2016-05-08 MED ORDER — LISINOPRIL-HYDROCHLOROTHIAZIDE 10-12.5 MG PO TABS: 1.0000 | ORAL_TABLET | Freq: Every day | ORAL | 0 refills | Status: DC

## 2016-05-08 MED ORDER — METFORMIN HCL 500 MG PO TABS: 500.0000 mg | ORAL_TABLET | Freq: Every day | ORAL | 1 refills | Status: DC

## 2016-05-08 MED ORDER — LORATADINE 10 MG PO TABS: 10.0000 mg | ORAL_TABLET | Freq: Every day | ORAL | 11 refills | Status: DC

## 2016-05-08 NOTE — Progress Notes (Signed)
Name: Lindsey Nguyen   MRN: [id number]    DOB: [birth year]   Date:05/08/2016       Progress Note  Subjective  Chief Complaint  Chief Complaint  Patient presents with  . Hypertension  . Diabetes  . Allergic Rhinitis   . Osteoporosis    Hypertension  This is a chronic problem. The current episode started more than 1 year ago. The problem has been gradually improving since onset. The problem is controlled. Pertinent negatives include no anxiety, blurred vision, chest pain, headaches, malaise/fatigue, neck pain, orthopnea, palpitations, peripheral edema, PND, shortness of breath or sweats. There are no associated agents to hypertension. Risk factors for coronary artery disease include smoking/tobacco exposure. Past treatments include ACE inhibitors and diuretics. The current treatment provides moderate improvement. There are no compliance problems.  Hypertensive end-organ damage includes PVD. There is no history of angina, kidney disease, CAD/MI, CVA, heart failure, left ventricular hypertrophy, renovascular disease or retinopathy. There is no history of chronic renal disease, coarctation of the aorta or a hypertension causing med.  Diabetes  She presents for her follow-up diabetic visit. She has type 2 diabetes mellitus. Her disease course has been improving. Pertinent negatives for hypoglycemia include no confusion, dizziness, headaches, hunger, mood changes, nervousness/anxiousness, pallor, seizures, sleepiness, speech difficulty, sweats or tremors. Pertinent negatives for diabetes include no blurred vision, no chest pain, no fatigue, no foot paresthesias, no foot ulcerations, no polydipsia, no polyphagia, no visual change, no weakness and no weight loss. There are no hypoglycemic complications. Symptoms are worsening. Diabetic complications include PVD. Pertinent negatives for diabetic complications include no CVA, heart disease, peripheral neuropathy or retinopathy. Current diabetic treatment includes  oral agent (monotherapy). Her weight is stable. She is following a generally healthy diet. She does not see a podiatrist.Eye exam is not current.    No problem-specific Assessment & Plan notes found for this encounter.   Past Medical History:  Diagnosis Date  . Cancer [facility])    right breast cancer with lumpectomy and rad tx  . COPD (chronic obstructive pulmonary disease) ([place])   . Diabetes mellitus without complication ([place])   . Hypertension   . Osteoporosis     Past Surgical History:  Procedure Laterality Date  . BREAST LUMPECTOMY     x 2  . BUNIONECTOMY Right   . COLONOSCOPY     normal  . PERIPHERAL VASCULAR CATHETERIZATION N/A 04/10/2016   Procedure: Lower Extremity Angiography;  Surgeon: Katha Cabal, MD;  Location: [facility];  Service: Cardiovascular;  Laterality: N/A;  . PERIPHERAL VASCULAR CATHETERIZATION  04/10/2016   Procedure: Lower Extremity Intervention;  Surgeon: Katha Cabal, MD;  Location: [facility];  Service: Cardiovascular;;  . TUBAL LIGATION      Family History  Problem Relation Age of Onset  . Cancer Father   . Diabetes Father   . Cancer Brother   . Diabetes Paternal Aunt   . Diabetes Paternal Uncle     Social History   Social History  . Marital status: Divorced    Spouse name: N/A  . Number of children: N/A  . Years of education: N/A   Occupational History  . Not on file.   Social History Main Topics  . Smoking status: Current Every Day Smoker  . Smokeless tobacco: Never Used  . Alcohol use 0.0 oz/week  . Drug use: No  . Sexual activity: No   Other Topics Concern  . Not on file   Social History Narrative  .  No narrative on file    Allergies  Allergen Reactions  . Tetanus Toxoids Swelling     Review of Systems  Constitutional: Negative for chills, fatigue, fever, malaise/fatigue and weight loss.  HENT: Negative for ear discharge, ear pain and sore throat.   Eyes: Negative for blurred vision.   Respiratory: Negative for cough, sputum production, shortness of breath and wheezing.   Cardiovascular: Negative for chest pain, palpitations, orthopnea, leg swelling and PND.  Gastrointestinal: Negative for abdominal pain, blood in stool, constipation, diarrhea, heartburn, melena and nausea.  Genitourinary: Negative for dysuria, frequency, hematuria and urgency.  Musculoskeletal: Negative for back pain, joint pain, myalgias and neck pain.  Skin: Negative for pallor and rash.  Neurological: Negative for dizziness, tingling, tremors, sensory change, focal weakness, seizures, speech difficulty, weakness and headaches.  Endo/Heme/Allergies: Negative for environmental allergies, polydipsia and polyphagia. Does not bruise/bleed easily.  Psychiatric/Behavioral: Negative for confusion, depression and suicidal ideas. The patient is not nervous/anxious and does not have insomnia.      Objective  Vitals:   05/08/16 1547  BP: 124/70  Pulse: 80  Weight: 74 lb (33.6 kg)  Height: 5\' 4"  (1.626 m)    Physical Exam  Constitutional: She is well-developed, well-nourished, and in no distress. No distress.  HENT:  Head: Normocephalic and atraumatic.  Right Ear: External ear normal.  Left Ear: External ear normal.  Nose: Nose normal.  Mouth/Throat: Oropharynx is clear and moist.  Eyes: Conjunctivae and EOM are normal. Pupils are equal, round, and reactive to light. Right eye exhibits no discharge. Left eye exhibits no discharge.  Neck: Normal range of motion. Neck supple. No JVD present. No thyromegaly present.  Cardiovascular: Normal rate, regular rhythm, normal heart sounds and intact distal pulses.  Exam reveals no gallop and no friction rub.   No murmur heard. Pulmonary/Chest: Effort normal and breath sounds normal.  Abdominal: Soft. Bowel sounds are normal. She exhibits no mass. There is no tenderness. There is no guarding.  Musculoskeletal: Normal range of motion. She exhibits no edema.   Lymphadenopathy:    She has no cervical adenopathy.  Neurological: She is alert.  Skin: Skin is warm and dry. She is not diaphoretic.  Psychiatric: Mood and affect normal.  Nursing note and vitals reviewed.     Assessment & Plan  Problem List Items Addressed This Visit      Cardiovascular and Mediastinum   Essential hypertension - Primary   Relevant Medications   lisinopril-hydrochlorothiazide (PRINZIDE,ZESTORETIC) 10-12.5 MG tablet   Aortic atherosclerosis (HCC)   Relevant Medications   lisinopril-hydrochlorothiazide (PRINZIDE,ZESTORETIC) 10-12.5 MG tablet     Respiratory   Allergic rhinitis due to pollen   Relevant Medications   loratadine (CLARITIN) 10 MG tablet     Endocrine   Diabetes mellitus without complication (HCC)   Relevant Medications   metFORMIN (GLUCOPHAGE) 500 MG tablet   lisinopril-hydrochlorothiazide (PRINZIDE,ZESTORETIC) 10-12.5 MG tablet   Other Relevant Orders   Microalbumin / creatinine urine ratio     Musculoskeletal and Integument   Osteoporosis   Relevant Medications   alendronate (FOSAMAX) 70 MG tablet     Other   Prediabetes   Relevant Medications   metFORMIN (GLUCOPHAGE) 500 MG tablet    Other Visit Diagnoses   None.       Dr. [facility]  05/08/16

## 2016-05-09 LAB — MICROALBUMIN / CREATININE URINE RATIO
Creatinine, Urine: 15 mg/dL
MICROALB/CREAT RATIO: 418 mg/g{creat} — AB (ref 0.0–30.0)
Microalbumin, Urine: 62.7 ug/mL

## 2016-07-30 ENCOUNTER — Other Ambulatory Visit: Payer: Self-pay | Admitting: Family Medicine

## 2016-07-30 DIAGNOSIS — M810 Age-related osteoporosis without current pathological fracture: Secondary | ICD-10-CM

## 2016-07-30 DIAGNOSIS — I10 Essential (primary) hypertension: Secondary | ICD-10-CM

## 2016-11-22 ENCOUNTER — Ambulatory Visit (INDEPENDENT_AMBULATORY_CARE_PROVIDER_SITE_OTHER): Payer: PRIVATE HEALTH INSURANCE | Admitting: Family Medicine

## 2016-11-22 ENCOUNTER — Other Ambulatory Visit: Payer: Self-pay | Admitting: Family Medicine

## 2016-11-22 ENCOUNTER — Encounter: Payer: Self-pay | Admitting: Family Medicine

## 2016-11-22 VITALS — BP 120/70 | HR 76 | Ht 64.0 in | Wt 75.0 lb

## 2016-11-22 DIAGNOSIS — R7303 Prediabetes: Secondary | ICD-10-CM

## 2016-11-22 DIAGNOSIS — K130 Diseases of lips: Secondary | ICD-10-CM | POA: Diagnosis not present

## 2016-11-22 DIAGNOSIS — I10 Essential (primary) hypertension: Secondary | ICD-10-CM | POA: Diagnosis not present

## 2016-11-22 DIAGNOSIS — J432 Centrilobular emphysema: Secondary | ICD-10-CM | POA: Diagnosis not present

## 2016-11-22 MED ORDER — METFORMIN HCL 500 MG PO TABS: 500.0000 mg | ORAL_TABLET | Freq: Every day | ORAL | 2 refills | Status: DC

## 2016-11-22 MED ORDER — ALBUTEROL SULFATE HFA 108 (90 BASE) MCG/ACT IN AERS: 2.0000 | INHALATION_SPRAY | Freq: Four times a day (QID) | RESPIRATORY_TRACT | 11 refills | Status: DC | PRN

## 2016-11-22 MED ORDER — NYSTATIN-TRIAMCINOLONE 100000-0.1 UNIT/GM-% EX OINT: 1.0000 "application " | TOPICAL_OINTMENT | Freq: Two times a day (BID) | CUTANEOUS | 0 refills | Status: DC

## 2016-11-22 MED ORDER — LISINOPRIL-HYDROCHLOROTHIAZIDE 10-12.5 MG PO TABS: 1.0000 | ORAL_TABLET | Freq: Every day | ORAL | 2 refills | Status: DC

## 2016-11-22 MED ORDER — FLUTICASONE FUROATE-VILANTEROL 200-25 MCG/INH IN AEPB: 1.0000 | INHALATION_SPRAY | Freq: Every day | RESPIRATORY_TRACT | 6 refills | Status: DC

## 2016-11-22 NOTE — Progress Notes (Signed)
Name: Lindsey Nguyen   MRN: [id number]    DOB: 11-21-1950   Date:11/22/2016       Progress Note  Subjective  Chief Complaint  Chief Complaint  Patient presents with  . Diabetes  . Hypertension  . Osteoporosis  . COPD    Breo only- pulmonologist takes care of [facility]    Diabetes  She presents for her follow-up diabetic visit. She has type 2 diabetes mellitus. Her disease course has been stable. Pertinent negatives for hypoglycemia include no confusion, dizziness, headaches, hunger, mood changes, nervousness/anxiousness, pallor, seizures, sleepiness, speech difficulty, sweats or tremors. Pertinent negatives for diabetes include no blurred vision, no chest pain, no fatigue, no foot paresthesias, no foot ulcerations, no polydipsia, no polyphagia, no polyuria, no visual change, no weakness and no weight loss. There are no hypoglycemic complications. Symptoms are stable. There are no diabetic complications. Pertinent negatives for diabetic complications include no CVA, PVD or retinopathy. There are no known risk factors for coronary artery disease. Current diabetic treatment includes oral agent (monotherapy). She is compliant with treatment all of the time. Her weight is stable. She is following a generally healthy diet. An ACE inhibitor/angiotensin II receptor blocker is being taken. She does not see a podiatrist.Eye exam is not current.  Hypertension  This is a chronic problem. The current episode started more than 1 year ago. The problem has been waxing and waning since onset. The problem is controlled. Pertinent negatives include no anxiety, blurred vision, chest pain, headaches, malaise/fatigue, neck pain, orthopnea, palpitations, peripheral edema, PND, shortness of breath or sweats. There are no associated agents to hypertension. Risk factors for coronary artery disease include diabetes mellitus and dyslipidemia. Past treatments include ACE inhibitors and diuretics. The current treatment provides  mild improvement. There is no history of angina, kidney disease, CAD/MI, CVA, heart failure, left ventricular hypertrophy, PVD, renovascular disease or retinopathy. There is no history of chronic renal disease or a hypertension causing med.  Shortness of Breath  This is a chronic problem. The current episode started more than 1 year ago. The problem occurs constantly. Pertinent negatives include no abdominal pain, chest pain, coryza, ear pain, fever, headaches, leg swelling, neck pain, orthopnea, PND, rash, sore throat, sputum production or wheezing. Nothing aggravates the symptoms. She has tried beta agonist inhalers, steroid inhalers and ipratropium inhalers for the symptoms. The treatment provided moderate relief. There is no history of allergies, asthma, bronchiolitis, CAD, a heart failure or PE.    No problem-specific Assessment & Plan notes found for this encounter.   Past Medical History:  Diagnosis Date  . Cancer [facility])    right breast cancer with lumpectomy and rad tx  . COPD (chronic obstructive pulmonary disease) ([place])   . Diabetes mellitus without complication ([place])   . Hypertension   . Osteoporosis     Past Surgical History:  Procedure Laterality Date  . BREAST LUMPECTOMY     x 2  . BUNIONECTOMY Right   . COLONOSCOPY     normal  . PERIPHERAL VASCULAR CATHETERIZATION N/A 04/10/2016   Procedure: Lower Extremity Angiography;  Surgeon: Katha Cabal, MD;  Location: [place] [facility];  Service: Cardiovascular;  Laterality: N/A;  . PERIPHERAL VASCULAR CATHETERIZATION  04/10/2016   Procedure: Lower Extremity Intervention;  Surgeon: Katha Cabal, MD;  Location: [place] [facility];  Service: Cardiovascular;;  . TUBAL LIGATION      Family History  Problem Relation Age of Onset  . Cancer Father   . Diabetes Father   .  Cancer Brother   . Diabetes Paternal Aunt   . Diabetes Paternal Uncle     Social History   Social History  . Marital status: Divorced    Spouse  name: N/A  . Number of children: N/A  . Years of education: N/A   Occupational History  . Not on file.   Social History Main Topics  . Smoking status: Current Every Day Smoker  . Smokeless tobacco: Never Used  . Alcohol use 0.0 oz/week  . Drug use: No  . Sexual activity: No   Other Topics Concern  . Not on file   Social History Narrative  . No narrative on file    Allergies  Allergen Reactions  . Tetanus Toxoids Swelling    Outpatient Medications Prior to Visit  Medication Sig Dispense Refill  . Aspirin Effervescent (ALKA-SELTZER PO) Take 1 tablet by mouth daily.    Marland Kitchen OVER THE COUNTER MEDICATION Place 1 drop into both ears daily as needed (ear pain, infection). Reported on 04/10/2016- Dr Kathyrn Sheriff    . alendronate (FOSAMAX) 70 MG tablet TAKE 1 TABLET (70 MG TOTAL) BY MOUTH ONCE A WEEK. 12 tablet 0  . fluticasone furoate-vilanterol (BREO ELLIPTA) 200-25 MCG/INH AEPB Inhale 1 puff into the lungs daily. (Patient taking differently: Inhale 1 puff into the lungs daily. pulmonology) 1 each 6  . lisinopril-hydrochlorothiazide (PRINZIDE,ZESTORETIC) 10-12.5 MG tablet TAKE 1 TABLET BY MOUTH DAILY. 90 tablet 0  . metFORMIN (GLUCOPHAGE) 500 MG tablet Take 1 tablet (500 mg total) by mouth daily. 90 tablet 1  . loratadine (CLARITIN) 10 MG tablet Take 1 tablet (10 mg total) by mouth daily. (Patient not taking: Reported on 11/22/2016) 30 tablet 11  . albuterol (PROVENTIL HFA;VENTOLIN HFA) 108 (90 Base) MCG/ACT inhaler Inhale 2 puffs into the lungs every 6 (six) hours as needed for wheezing or shortness of breath. (Patient not taking: Reported on 11/22/2016) 1 Inhaler 11   No facility-administered medications prior to visit.     Review of Systems  Constitutional: Negative for chills, fatigue, fever, malaise/fatigue and weight loss.  HENT: Negative for ear discharge, ear pain and sore throat.   Eyes: Negative for blurred vision.  Respiratory: Negative for cough, sputum production, shortness of  breath and wheezing.   Cardiovascular: Negative for chest pain, palpitations, orthopnea, leg swelling and PND.  Gastrointestinal: Negative for abdominal pain, blood in stool, constipation, diarrhea, heartburn, melena and nausea.  Genitourinary: Negative for dysuria, frequency, hematuria and urgency.  Musculoskeletal: Negative for back pain, joint pain, myalgias and neck pain.  Skin: Negative for pallor and rash.  Neurological: Negative for dizziness, tingling, tremors, sensory change, focal weakness, seizures, speech difficulty, weakness and headaches.  Endo/Heme/Allergies: Negative for environmental allergies, polydipsia and polyphagia. Does not bruise/bleed easily.  Psychiatric/Behavioral: Negative for confusion, depression and suicidal ideas. The patient is not nervous/anxious and does not have insomnia.      Objective  Vitals:   11/22/16 1528  BP: 120/70  Pulse: 76  Weight: 75 lb (34 kg)  Height: 5\' 4"  (1.626 m)    Physical Exam  Constitutional: She is well-developed, well-nourished, and in no distress. No distress.  HENT:  Head: Normocephalic and atraumatic.  Right Ear: External ear normal.  Left Ear: External ear normal.  Nose: Nose normal.  Mouth/Throat: Oropharynx is clear and moist.  Eyes: Conjunctivae and EOM are normal. Pupils are equal, round, and reactive to light. Right eye exhibits no discharge. Left eye exhibits no discharge.  Neck: Normal range of motion. Neck supple. No  JVD present. No thyromegaly present.  Cardiovascular: Normal rate, regular rhythm, normal heart sounds and intact distal pulses.  Exam reveals no gallop and no friction rub.   No murmur heard. Pulmonary/Chest: Effort normal and breath sounds normal.  Abdominal: Soft. Bowel sounds are normal. She exhibits no mass. There is no tenderness. There is no guarding.  Musculoskeletal: Normal range of motion. She exhibits no edema.  Lymphadenopathy:    She has no cervical adenopathy.  Neurological: She  is alert. She has normal reflexes.  Skin: Skin is warm and dry. She is not diaphoretic.  Psychiatric: Mood and affect normal.  Nursing note and vitals reviewed.     Assessment & Plan  Problem List Items Addressed This Visit      Cardiovascular and Mediastinum   Essential hypertension   Relevant Medications   lisinopril-hydrochlorothiazide (PRINZIDE,ZESTORETIC) 10-12.5 MG tablet   Other Relevant Orders   Renal Function Panel     Respiratory   COLD (chronic obstructive lung disease) (HCC)   Relevant Medications   tiotropium (SPIRIVA) 18 MCG inhalation capsule   albuterol (PROVENTIL HFA;VENTOLIN HFA) 108 (90 Base) MCG/ACT inhaler   fluticasone furoate-vilanterol (BREO ELLIPTA) 200-25 MCG/INH AEPB     Other   Prediabetes   Relevant Medications   metFORMIN (GLUCOPHAGE) 500 MG tablet   Other Relevant Orders   Hemoglobin A1c    Other Visit Diagnoses    Cheilitis    -  Primary   Relevant Medications   nystatin-triamcinolone ointment (MYCOLOG)      Meds ordered this encounter  Medications  . lisinopril-hydrochlorothiazide (PRINZIDE,ZESTORETIC) 10-12.5 MG tablet    Sig: Take 1 tablet by mouth daily.    Dispense:  90 tablet    Refill:  2  . metFORMIN (GLUCOPHAGE) 500 MG tablet    Sig: Take 1 tablet (500 mg total) by mouth daily.    Dispense:  90 tablet    Refill:  2  . albuterol (PROVENTIL HFA;VENTOLIN HFA) 108 (90 Base) MCG/ACT inhaler    Sig: Inhale 2 puffs into the lungs every 6 (six) hours as needed for wheezing or shortness of breath.    Dispense:  1 Inhaler    Refill:  11  . fluticasone furoate-vilanterol (BREO ELLIPTA) 200-25 MCG/INH AEPB    Sig: Inhale 1 puff into the lungs daily.    Dispense:  1 each    Refill:  6  . nystatin-triamcinolone ointment (MYCOLOG)    Sig: Apply 1 application topically 2 (two) times daily.    Dispense:  30 g    Refill:  0      Dr. [facility]  11/22/16

## 2016-11-23 LAB — RENAL FUNCTION PANEL
Albumin: 5.3 g/dL — ABNORMAL HIGH (ref 3.6–4.8)
BUN / CREAT RATIO: 28 (ref 12–28)
BUN: 21 mg/dL (ref 8–27)
CALCIUM: 10.6 mg/dL — AB (ref 8.7–10.3)
CO2: 27 mmol/L (ref 18–29)
Chloride: 87 mmol/L — ABNORMAL LOW (ref 96–106)
Creatinine, Ser: 0.76 mg/dL (ref 0.57–1.00)
GFR calc Af Amer: 96 mL/min/{1.73_m2} (ref >59)
GFR, EST NON AFRICAN AMERICAN: 83 mL/min/{1.73_m2} (ref >59)
Glucose: 123 mg/dL — ABNORMAL HIGH (ref 65–99)
Phosphorus: 3.9 mg/dL (ref 2.5–4.5)
Potassium: 5.2 mmol/L (ref 3.5–5.2)
SODIUM: 131 mmol/L — AB (ref 134–144)

## 2016-11-23 LAB — HEMOGLOBIN A1C
Est. average glucose Bld gHb Est-mCnc: 117 mg/dL
Hgb A1c MFr Bld: 5.7 % — ABNORMAL HIGH (ref 4.8–5.6)

## 2016-12-05 ENCOUNTER — Ambulatory Visit (INDEPENDENT_AMBULATORY_CARE_PROVIDER_SITE_OTHER): Payer: Self-pay | Admitting: Vascular Surgery

## 2016-12-05 ENCOUNTER — Encounter (INDEPENDENT_AMBULATORY_CARE_PROVIDER_SITE_OTHER): Payer: PRIVATE HEALTH INSURANCE

## 2016-12-06 ENCOUNTER — Ambulatory Visit (INDEPENDENT_AMBULATORY_CARE_PROVIDER_SITE_OTHER): Payer: PRIVATE HEALTH INSURANCE | Admitting: Vascular Surgery

## 2016-12-06 ENCOUNTER — Encounter (INDEPENDENT_AMBULATORY_CARE_PROVIDER_SITE_OTHER): Payer: Self-pay | Admitting: Vascular Surgery

## 2016-12-06 ENCOUNTER — Other Ambulatory Visit (INDEPENDENT_AMBULATORY_CARE_PROVIDER_SITE_OTHER): Payer: PRIVATE HEALTH INSURANCE

## 2016-12-06 ENCOUNTER — Other Ambulatory Visit (INDEPENDENT_AMBULATORY_CARE_PROVIDER_SITE_OTHER): Payer: Self-pay | Admitting: Vascular Surgery

## 2016-12-06 VITALS — BP 177/97 | HR 90 | Resp 16 | Wt 75.0 lb

## 2016-12-06 DIAGNOSIS — I700 Atherosclerosis of aorta: Secondary | ICD-10-CM

## 2016-12-06 DIAGNOSIS — I10 Essential (primary) hypertension: Secondary | ICD-10-CM | POA: Diagnosis not present

## 2016-12-06 DIAGNOSIS — E119 Type 2 diabetes mellitus without complications: Secondary | ICD-10-CM

## 2016-12-06 NOTE — Progress Notes (Signed)
Subjective:    Patient ID: Lindsey Nguyen, female    DOB: 10/25/1950, 66 y.o.   MRN: [id number] Chief Complaint  Patient presents with  . Follow-up   Patient presents for a six month lower extremity atherosclerotic disease follow up. Patient with known stenosis of the distal aorta. The patient underwent an ABI which showed normal bilateral ABI's, bilateral abnormal toe-brachial indices (no previous for comparison). An aorto-iliac arterial duplex is notable for findings consistent with previous angiogram on 04/10/16 which demonstrated a greater than 90% stenosis of the infrarenal aorta. No hemodynamically significant stenosis in bilateral iliac arteries. The patient denies any claudication like symptoms, rest pain or ulcers to her feet / toes.    Review of Systems  Constitutional: Negative.   HENT: Negative.   Eyes: Negative.   Respiratory: Negative.   Cardiovascular: Negative.   Gastrointestinal: Negative.   Endocrine: Negative.   Genitourinary: Negative.   Musculoskeletal: Negative.   Skin: Negative.   Allergic/Immunologic: Negative.   Neurological: Negative.   Hematological: Negative.   Psychiatric/Behavioral: Negative.       Objective:   Physical Exam  Constitutional: She is oriented to person, place, and time. She appears well-developed.  Thin.  HENT:  Head: Normocephalic and atraumatic.  Right Ear: External ear normal.  Left Ear: External ear normal.  Eyes: Conjunctivae and EOM are normal. Pupils are equal, round, and reactive to light.  Neck: Normal range of motion.  Cardiovascular: Normal rate, regular rhythm and normal heart sounds.   Pulses:      Radial pulses are 2+ on the right side, and 2+ on the left side.       Dorsalis pedis pulses are 1+ on the right side, and 1+ on the left side.       Posterior tibial pulses are 0 on the right side, and 1+ on the left side.  Pulmonary/Chest: Effort normal and breath sounds normal.  Musculoskeletal: Normal range of motion.  She exhibits no edema.  Neurological: She is alert and oriented to person, place, and time.  Skin: Skin is warm and dry.  Psychiatric: She has a normal mood and affect. Her behavior is normal. Judgment and thought content normal.   BP (!) 177/97   Pulse 90   Resp 16   Wt 75 lb (34 kg)   BMI 12.87 kg/m   Past Medical History:  Diagnosis Date  . Cancer [facility])    right breast cancer with lumpectomy and rad tx  . COPD (chronic obstructive pulmonary disease) ([place])   . Diabetes mellitus without complication ([place])   . Hypertension   . Osteoporosis    Social History   Social History  . Marital status: Divorced    Spouse name: N/A  . Number of children: N/A  . Years of education: N/A   Occupational History  . Not on file.   Social History Main Topics  . Smoking status: Current Every Day Smoker  . Smokeless tobacco: Never Used  . Alcohol use 0.0 oz/week  . Drug use: No  . Sexual activity: No   Other Topics Concern  . Not on file   Social History Narrative  . No narrative on file   Past Surgical History:  Procedure Laterality Date  . BREAST LUMPECTOMY     x 2  . BUNIONECTOMY Right   . COLONOSCOPY     normal  . PERIPHERAL VASCULAR CATHETERIZATION N/A 04/10/2016   Procedure: Lower Extremity Angiography;  Surgeon: Katha Cabal, MD;  Location:  [place] [facility];  Service: Cardiovascular;  Laterality: N/A;  . PERIPHERAL VASCULAR CATHETERIZATION  04/10/2016   Procedure: Lower Extremity Intervention;  Surgeon: Katha Cabal, MD;  Location: [place] [facility];  Service: Cardiovascular;;  . TUBAL LIGATION     Family History  Problem Relation Age of Onset  . Cancer Father   . Diabetes Father   . Cancer Brother   . Diabetes Paternal Aunt   . Diabetes Paternal Uncle     Allergies  Allergen Reactions  . Tetanus Toxoids Swelling       Assessment & Plan:  Patient presents for a six month lower extremity atherosclerotic disease follow up. Patient with known  stenosis of the distal aorta. The patient underwent an ABI which showed normal bilateral ABI's, bilateral abnormal toe-brachial indices (no previous for comparison). An aorto-iliac arterial duplex is notable for findings consistent with previous angiogram on 04/10/16 which demonstrated a greater than 90% stenosis of the infrarenal aorta. No hemodynamically significant stenosis in bilateral iliac arteries. The patient denies any claudication like symptoms, rest pain or ulcers to her feet / toes.   1. Aortic atherosclerosis ([place]) - Stable Patient with known infrarenal disease. Asymptomatic at this time with stable ABI and duplex.  No intervention indication at this time. Will bring back in six months for close observation. Patient to remain abstinent of tobacco use. I have discussed with the patient at length the risk factors for and pathogenesis of atherosclerotic disease and encouraged a healthy diet, regular exercise regimen and blood pressure / glucose control.  The patient was encouraged to call the office in the interim if he experiences any claudication like symptoms, rest pain or ulcers to his feet / toes.  - VAS [place] ABI WITH/WO TBI; Future - VAS US AORTA/IVC/ILIACS; Future  2. Diabetes mellitus without complication ([place]) - Stable Encouraged good control as its slows the progression of atherosclerotic disease  3. Essential hypertension - Stable Encouraged good control as its slows the progression of atherosclerotic disease  Current Outpatient Prescriptions on File Prior to Visit  Medication Sig Dispense Refill  . albuterol (PROVENTIL HFA;VENTOLIN HFA) 108 (90 Base) MCG/ACT inhaler Inhale 2 puffs into the lungs every 6 (six) hours as needed for wheezing or shortness of breath. 1 Inhaler 11  . Aspirin Effervescent (ALKA-SELTZER PO) Take 1 tablet by mouth daily.    . fluticasone furoate-vilanterol (BREO ELLIPTA) 200-25 MCG/INH AEPB Inhale 1 puff into the lungs daily. 1 each 6  .  lisinopril-hydrochlorothiazide (PRINZIDE,ZESTORETIC) 10-12.5 MG tablet Take 1 tablet by mouth daily. 90 tablet 2  . loratadine (CLARITIN) 10 MG tablet Take 1 tablet (10 mg total) by mouth daily. 30 tablet 11  . metFORMIN (GLUCOPHAGE) 500 MG tablet Take 1 tablet (500 mg total) by mouth daily. 90 tablet 2  . nystatin-triamcinolone ointment (MYCOLOG) Apply 1 application topically 2 (two) times daily. 30 g 0  . OVER THE COUNTER MEDICATION Place 1 drop into both ears daily as needed (ear pain, infection). Reported on 04/10/2016- Dr Kathyrn Sheriff    . tiotropium (SPIRIVA) 18 MCG inhalation capsule Place 1 capsule into inhaler and inhale daily. Pulmonology     No current facility-administered medications on file prior to visit.     There are no Patient Instructions on file for this visit. No Follow-up on file.   Rayola Everhart A Mohid Furuya, PA-C

## 2017-01-07 ENCOUNTER — Encounter: Payer: Self-pay | Admitting: Family Medicine

## 2017-01-07 ENCOUNTER — Ambulatory Visit (INDEPENDENT_AMBULATORY_CARE_PROVIDER_SITE_OTHER): Payer: PRIVATE HEALTH INSURANCE | Admitting: Family Medicine

## 2017-01-07 VITALS — BP 102/78 | HR 94 | Temp 97.5°F | Resp 16 | Ht 64.0 in | Wt 78.0 lb

## 2017-01-07 DIAGNOSIS — N3090 Cystitis, unspecified without hematuria: Secondary | ICD-10-CM | POA: Diagnosis not present

## 2017-01-07 DIAGNOSIS — R350 Frequency of micturition: Secondary | ICD-10-CM | POA: Diagnosis not present

## 2017-01-07 LAB — POCT URINALYSIS DIPSTICK
Bilirubin, UA: NEGATIVE
Glucose, UA: NEGATIVE
Ketones, UA: NEGATIVE
NITRITE UA: POSITIVE
SPEC GRAV UA: 1.01 (ref 1.030–1.035)
UROBILINOGEN UA: 0.2 (ref ?–2.0)
pH, UA: 7 (ref 5.0–8.0)

## 2017-01-07 MED ORDER — SULFAMETHOXAZOLE-TRIMETHOPRIM 800-160 MG PO TABS: 1.0000 | ORAL_TABLET | Freq: Two times a day (BID) | ORAL | 0 refills | Status: DC

## 2017-01-07 NOTE — Progress Notes (Signed)
Name: Lindsey Nguyen   MRN: [id number]    DOB: [birth year]   Date:01/07/2017       Progress Note  Subjective  Chief Complaint  Chief Complaint  Patient presents with  . Urinary Tract Infection    UTI symptoms since Friday. Frequent Urination and dysuria.     Urinary Tract Infection   This is a new problem. The current episode started in the past 7 days. The problem occurs every urination. The problem has been waxing and waning. The quality of the pain is described as burning. The pain is at a severity of 3/10. The pain is mild. There has been no fever. Associated symptoms include flank pain, frequency, hematuria and urgency. Pertinent negatives include no chills, hesitancy, nausea or vomiting. The treatment provided moderate relief. Her past medical history is significant for recurrent UTIs. There is no history of catheterization, kidney stones, a single kidney, urinary stasis or a urological procedure.    No problem-specific Assessment & Plan notes found for this encounter.   Past Medical History:  Diagnosis Date  . Cancer [facility])    right breast cancer with lumpectomy and rad tx  . COPD (chronic obstructive pulmonary disease) ([place])   . Diabetes mellitus without complication ([place])   . Hypertension   . Osteoporosis     Past Surgical History:  Procedure Laterality Date  . BREAST LUMPECTOMY     x 2  . BUNIONECTOMY Right   . COLONOSCOPY     normal  . PERIPHERAL VASCULAR CATHETERIZATION N/A 04/10/2016   Procedure: Lower Extremity Angiography;  Surgeon: Katha Cabal, MD;  Location: [place] [facility];  Service: Cardiovascular;  Laterality: N/A;  . PERIPHERAL VASCULAR CATHETERIZATION  04/10/2016   Procedure: Lower Extremity Intervention;  Surgeon: Katha Cabal, MD;  Location: [place] [facility];  Service: Cardiovascular;;  . TUBAL LIGATION      Family History  Problem Relation Age of Onset  . Cancer Father   . Diabetes Father   . Cancer Brother   . Diabetes Paternal  Aunt   . Diabetes Paternal Uncle     Social History   Social History  . Marital status: Divorced    Spouse name: N/A  . Number of children: N/A  . Years of education: N/A   Occupational History  . Not on file.   Social History Main Topics  . Smoking status: Current Every Day Smoker  . Smokeless tobacco: Never Used  . Alcohol use 0.0 oz/week  . Drug use: No  . Sexual activity: No   Other Topics Concern  . Not on file   Social History Narrative  . No narrative on file    Allergies  Allergen Reactions  . Tetanus Toxoids Swelling    Outpatient Medications Prior to Visit  Medication Sig Dispense Refill  . albuterol (PROVENTIL HFA;VENTOLIN HFA) 108 (90 Base) MCG/ACT inhaler Inhale 2 puffs into the lungs every 6 (six) hours as needed for wheezing or shortness of breath. 1 Inhaler 11  . Aspirin Effervescent (ALKA-SELTZER PO) Take 1 tablet by mouth daily.    . fluticasone furoate-vilanterol (BREO ELLIPTA) 200-25 MCG/INH AEPB Inhale 1 puff into the lungs daily. 1 each 6  . lisinopril-hydrochlorothiazide (PRINZIDE,ZESTORETIC) 10-12.5 MG tablet Take 1 tablet by mouth daily. 90 tablet 2  . metFORMIN (GLUCOPHAGE) 500 MG tablet Take 1 tablet (500 mg total) by mouth daily. 90 tablet 2  . nystatin-triamcinolone ointment (MYCOLOG) Apply 1 application topically 2 (two) times daily. 30 g 0  .  tiotropium (SPIRIVA) 18 MCG inhalation capsule Place 1 capsule into inhaler and inhale daily. Pulmonology    . OVER THE COUNTER MEDICATION Place 1 drop into both ears daily as needed (ear pain, infection). Reported on 04/10/2016- Dr Kathyrn Sheriff    . loratadine (CLARITIN) 10 MG tablet Take 1 tablet (10 mg total) by mouth daily. (Patient not taking: Reported on 01/07/2017) 30 tablet 11   No facility-administered medications prior to visit.     Review of Systems  Constitutional: Negative for chills, fever, malaise/fatigue and weight loss.  HENT: Negative for ear discharge, ear pain and sore throat.   Eyes:  Negative for blurred vision.  Respiratory: Negative for cough, sputum production, shortness of breath and wheezing.   Cardiovascular: Negative for chest pain, palpitations and leg swelling.  Gastrointestinal: Negative for abdominal pain, blood in stool, constipation, diarrhea, heartburn, melena, nausea and vomiting.  Genitourinary: Positive for dysuria, flank pain, frequency, hematuria and urgency. Negative for hesitancy.  Musculoskeletal: Negative for back pain, joint pain, myalgias and neck pain.  Skin: Negative for rash.  Neurological: Negative for dizziness, tingling, sensory change, focal weakness and headaches.  Endo/Heme/Allergies: Negative for environmental allergies and polydipsia. Does not bruise/bleed easily.  Psychiatric/Behavioral: Negative for depression and suicidal ideas. The patient is not nervous/anxious and does not have insomnia.      Objective  Vitals:   01/07/17 1050  BP: 102/78  Pulse: 94  Resp: 16  Temp: 97.5 F (36.4 C)  SpO2: 97%  Weight: 78 lb (35.4 kg)  Height: 5\' 4"  (1.626 m)    Physical Exam  Constitutional: She is well-developed, well-nourished, and in no distress. No distress.  HENT:  Head: Normocephalic and atraumatic.  Right Ear: External ear normal.  Left Ear: External ear normal.  Nose: Nose normal.  Mouth/Throat: Oropharynx is clear and moist.  Eyes: Conjunctivae and EOM are normal. Pupils are equal, round, and reactive to light. Right eye exhibits no discharge. Left eye exhibits no discharge.  Neck: Normal range of motion. Neck supple. No JVD present. No thyromegaly present.  Cardiovascular: Normal rate, regular rhythm, normal heart sounds and intact distal pulses.  Exam reveals no gallop and no friction rub.   No murmur heard. Pulmonary/Chest: Effort normal and breath sounds normal. She has no wheezes. She has no rales.  Abdominal: Soft. Bowel sounds are normal. She exhibits no mass. There is no hepatosplenomegaly. There is tenderness in  the suprapubic area. There is no guarding and no CVA tenderness.  Musculoskeletal: Normal range of motion. She exhibits no edema.  Lymphadenopathy:    She has no cervical adenopathy.  Neurological: She is alert.  Skin: Skin is warm and dry. She is not diaphoretic.  Psychiatric: Mood and affect normal.      Assessment & Plan  Problem List Items Addressed This Visit    None    Visit Diagnoses    Frequent urination    -  Primary   Relevant Medications   sulfamethoxazole-trimethoprim (BACTRIM DS,SEPTRA DS) 800-160 MG tablet   Other Relevant Orders   POCT urinalysis dipstick (Completed)   Cystitis       Relevant Medications   sulfamethoxazole-trimethoprim (BACTRIM DS,SEPTRA DS) 800-160 MG tablet      Meds ordered this encounter  Medications  . sulfamethoxazole-trimethoprim (BACTRIM DS,SEPTRA DS) 800-160 MG tablet    Sig: Take 1 tablet by mouth 2 (two) times daily.    Dispense:  10 tablet    Refill:  0      Dr. Otilio Miu [place]  [facility]  01/07/17

## 2017-03-23 ENCOUNTER — Other Ambulatory Visit: Payer: Self-pay | Admitting: Family Medicine

## 2017-03-23 DIAGNOSIS — J301 Allergic rhinitis due to pollen: Secondary | ICD-10-CM

## 2017-05-27 ENCOUNTER — Other Ambulatory Visit: Payer: Self-pay

## 2017-05-27 DIAGNOSIS — J432 Centrilobular emphysema: Secondary | ICD-10-CM

## 2017-05-27 MED ORDER — FLUTICASONE FUROATE-VILANTEROL 200-25 MCG/INH IN AEPB: 1.0000 | INHALATION_SPRAY | Freq: Every day | RESPIRATORY_TRACT | 3 refills | Status: DC

## 2017-06-13 ENCOUNTER — Encounter (INDEPENDENT_AMBULATORY_CARE_PROVIDER_SITE_OTHER): Payer: PRIVATE HEALTH INSURANCE

## 2017-06-13 ENCOUNTER — Ambulatory Visit (INDEPENDENT_AMBULATORY_CARE_PROVIDER_SITE_OTHER): Payer: PRIVATE HEALTH INSURANCE | Admitting: Vascular Surgery

## 2017-07-11 ENCOUNTER — Ambulatory Visit (INDEPENDENT_AMBULATORY_CARE_PROVIDER_SITE_OTHER): Payer: PRIVATE HEALTH INSURANCE | Admitting: Vascular Surgery

## 2017-07-11 ENCOUNTER — Encounter (INDEPENDENT_AMBULATORY_CARE_PROVIDER_SITE_OTHER): Payer: PRIVATE HEALTH INSURANCE

## 2017-07-23 ENCOUNTER — Ambulatory Visit (INDEPENDENT_AMBULATORY_CARE_PROVIDER_SITE_OTHER): Payer: PRIVATE HEALTH INSURANCE | Admitting: Family Medicine

## 2017-07-23 ENCOUNTER — Encounter: Payer: Self-pay | Admitting: Family Medicine

## 2017-07-23 VITALS — BP 120/72 | HR 104 | Ht 64.0 in | Wt 72.0 lb

## 2017-07-23 DIAGNOSIS — J40 Bronchitis, not specified as acute or chronic: Secondary | ICD-10-CM

## 2017-07-23 DIAGNOSIS — J0100 Acute maxillary sinusitis, unspecified: Secondary | ICD-10-CM | POA: Diagnosis not present

## 2017-07-23 MED ORDER — PREDNISONE 10 MG PO TABS: 10.0000 mg | ORAL_TABLET | Freq: Every day | ORAL | 0 refills | Status: DC

## 2017-07-23 MED ORDER — GUAIFENESIN-CODEINE 100-10 MG/5ML PO SYRP: 5.0000 mL | ORAL_SOLUTION | Freq: Three times a day (TID) | ORAL | 0 refills | Status: DC | PRN

## 2017-07-23 MED ORDER — LEVOFLOXACIN 500 MG PO TABS: 500.0000 mg | ORAL_TABLET | Freq: Every day | ORAL | 0 refills | Status: DC

## 2017-07-23 NOTE — Progress Notes (Signed)
Name: Lindsey Nguyen   MRN: [id number]    DOB: [birth year]   Date:07/23/2017       Progress Note  Subjective  Chief Complaint  Chief Complaint  Patient presents with  . Sinusitis    yellow production, cough and cong- "feels like when I had pneumonia before"    Sinusitis  This is a chronic problem. The current episode started 1 to 4 weeks ago. The problem has been gradually worsening since onset. There has been no fever. The pain is moderate. Associated symptoms include congestion, coughing and shortness of breath. Pertinent negatives include no chills, diaphoresis, ear pain, headaches, hoarse voice, neck pain, sinus pressure, sneezing, sore throat or swollen glands. Past treatments include nothing. The treatment provided moderate relief.  Cough  This is a chronic problem. The current episode started 1 to 4 weeks ago. The problem has been gradually worsening. The cough is productive of purulent sputum. Associated symptoms include nasal congestion and shortness of breath. Pertinent negatives include no chest pain, chills, ear pain, fever, headaches, heartburn, hemoptysis, myalgias, rash, sore throat, weight loss or wheezing. The symptoms are aggravated by pollens. She has tried a beta-agonist inhaler, steroid inhaler and ipratropium inhaler for the symptoms. Her past medical history is significant for COPD and emphysema. There is no history of environmental allergies.    No problem-specific Assessment & Plan notes found for this encounter.   Past Medical History:  Diagnosis Date  . Cancer [facility])    right breast cancer with lumpectomy and rad tx  . COPD (chronic obstructive pulmonary disease) ([place])   . Diabetes mellitus without complication ([place])   . Hypertension   . Osteoporosis     Past Surgical History:  Procedure Laterality Date  . BREAST LUMPECTOMY     x 2  . BUNIONECTOMY Right   . COLONOSCOPY     normal  . PERIPHERAL VASCULAR CATHETERIZATION N/A 04/10/2016   Procedure: Lower  Extremity Angiography;  Surgeon: Katha Cabal, MD;  Location: [place] [facility];  Service: Cardiovascular;  Laterality: N/A;  . PERIPHERAL VASCULAR CATHETERIZATION  04/10/2016   Procedure: Lower Extremity Intervention;  Surgeon: Katha Cabal, MD;  Location: [place] [facility];  Service: Cardiovascular;;  . TUBAL LIGATION      Family History  Problem Relation Age of Onset  . Cancer Father   . Diabetes Father   . Cancer Brother   . Diabetes Paternal Aunt   . Diabetes Paternal Uncle     Social History   Social History  . Marital status: Divorced    Spouse name: N/A  . Number of children: N/A  . Years of education: N/A   Occupational History  . Not on file.   Social History Main Topics  . Smoking status: Current Every Day Smoker  . Smokeless tobacco: Never Used  . Alcohol use 0.0 oz/week  . Drug use: No  . Sexual activity: No   Other Topics Concern  . Not on file   Social History Narrative  . No narrative on file    Allergies  Allergen Reactions  . Tetanus Toxoids Swelling    Outpatient Medications Prior to Visit  Medication Sig Dispense Refill  . albuterol (PROVENTIL HFA;VENTOLIN HFA) 108 (90 Base) MCG/ACT inhaler Inhale 2 puffs into the lungs every 6 (six) hours as needed for wheezing or shortness of breath. 1 Inhaler 11  . Aspirin Effervescent (ALKA-SELTZER PO) Take 1 tablet by mouth daily.    . fluticasone furoate-vilanterol (BREO ELLIPTA) 200-25  MCG/INH AEPB Inhale 1 puff into the lungs daily. 1 each 3  . lisinopril-hydrochlorothiazide (PRINZIDE,ZESTORETIC) 10-12.5 MG tablet Take 1 tablet by mouth daily. 90 tablet 2  . loratadine (CLARITIN) 10 MG tablet TAKE 1 TABLET (10 MG TOTAL) BY MOUTH DAILY. 30 tablet 3  . metFORMIN (GLUCOPHAGE) 500 MG tablet Take 1 tablet (500 mg total) by mouth daily. 90 tablet 2  . nystatin-triamcinolone ointment (MYCOLOG) Apply 1 application topically 2 (two) times daily. 30 g 0  . tiotropium (SPIRIVA) 18 MCG inhalation  capsule Place 1 capsule into inhaler and inhale daily. Pulmonology    . sulfamethoxazole-trimethoprim (BACTRIM DS,SEPTRA DS) 800-160 MG tablet Take 1 tablet by mouth 2 (two) times daily. 10 tablet 0   No facility-administered medications prior to visit.     Review of Systems  Constitutional: Negative for chills, diaphoresis, fever, malaise/fatigue and weight loss.  HENT: Positive for congestion. Negative for ear discharge, ear pain, hoarse voice, sinus pressure, sneezing and sore throat.   Eyes: Negative for blurred vision.  Respiratory: Positive for cough and shortness of breath. Negative for hemoptysis, sputum production and wheezing.   Cardiovascular: Negative for chest pain, palpitations and leg swelling.  Gastrointestinal: Negative for abdominal pain, blood in stool, constipation, diarrhea, heartburn, melena and nausea.  Genitourinary: Negative for dysuria, frequency, hematuria and urgency.  Musculoskeletal: Negative for back pain, joint pain, myalgias and neck pain.  Skin: Negative for rash.  Neurological: Negative for dizziness, tingling, sensory change, focal weakness and headaches.  Endo/Heme/Allergies: Negative for environmental allergies and polydipsia. Does not bruise/bleed easily.  Psychiatric/Behavioral: Negative for depression and suicidal ideas. The patient is not nervous/anxious and does not have insomnia.      Objective  Vitals:   07/23/17 1406  BP: 120/72  Pulse: (!) 104  SpO2: 96%  Weight: 72 lb (32.7 kg)  Height: 5\' 4"  (1.626 m)    Physical Exam  Constitutional: She is well-developed, well-nourished, and in no distress. No distress.  HENT:  Head: Normocephalic and atraumatic.  Right Ear: External ear normal.  Left Ear: External ear normal.  Nose: Nose normal.  Mouth/Throat: Oropharynx is clear and moist.  Eyes: Pupils are equal, round, and reactive to light. Conjunctivae and EOM are normal. Right eye exhibits no discharge. Left eye exhibits no discharge.   Neck: Normal range of motion. Neck supple. No JVD present. No thyromegaly present.  Cardiovascular: Normal rate, regular rhythm, normal heart sounds and intact distal pulses.  Exam reveals no gallop and no friction rub.   No murmur heard. Pulmonary/Chest: Effort normal and breath sounds normal. She has no wheezes. She has no rales.  Abdominal: Soft. Bowel sounds are normal. She exhibits no mass. There is no tenderness. There is no guarding.  Musculoskeletal: Normal range of motion. She exhibits no edema.  Lymphadenopathy:    She has no cervical adenopathy.  Neurological: She is alert. She has normal reflexes.  Skin: Skin is warm and dry. She is not diaphoretic.  Psychiatric: Mood and affect normal.  Nursing note and vitals reviewed.     Assessment & Plan  Problem List Items Addressed This Visit    None    Visit Diagnoses    Bronchitis    -  Primary   Relevant Medications   predniSONE (DELTASONE) 10 MG tablet   levofloxacin (LEVAQUIN) 500 MG tablet   guaiFENesin-codeine (ROBITUSSIN AC) 100-10 MG/5ML syrup   Acute maxillary sinusitis, recurrence not specified       Relevant Medications   predniSONE (DELTASONE) 10 MG  tablet   levofloxacin (LEVAQUIN) 500 MG tablet   guaiFENesin-codeine (ROBITUSSIN AC) 100-10 MG/5ML syrup      Meds ordered this encounter  Medications  . predniSONE (DELTASONE) 10 MG tablet    Sig: Take 1 tablet (10 mg total) by mouth daily with breakfast.    Dispense:  30 tablet    Refill:  0  . levofloxacin (LEVAQUIN) 500 MG tablet    Sig: Take 1 tablet (500 mg total) by mouth daily.    Dispense:  7 tablet    Refill:  0  . guaiFENesin-codeine (ROBITUSSIN AC) 100-10 MG/5ML syrup    Sig: Take 5 mLs by mouth 3 (three) times daily as needed for cough.    Dispense:  100 mL    Refill:  0      Dr. Otilio Miu [facility]  07/23/17

## 2017-08-14 ENCOUNTER — Ambulatory Visit (INDEPENDENT_AMBULATORY_CARE_PROVIDER_SITE_OTHER): Payer: PRIVATE HEALTH INSURANCE

## 2017-08-14 ENCOUNTER — Ambulatory Visit (INDEPENDENT_AMBULATORY_CARE_PROVIDER_SITE_OTHER): Payer: PRIVATE HEALTH INSURANCE | Admitting: Vascular Surgery

## 2017-08-14 DIAGNOSIS — I700 Atherosclerosis of aorta: Secondary | ICD-10-CM | POA: Diagnosis not present

## 2017-08-21 ENCOUNTER — Encounter (INDEPENDENT_AMBULATORY_CARE_PROVIDER_SITE_OTHER): Payer: Self-pay

## 2017-09-13 ENCOUNTER — Other Ambulatory Visit: Payer: Self-pay | Admitting: Family Medicine

## 2017-09-13 ENCOUNTER — Encounter: Payer: Self-pay | Admitting: Family Medicine

## 2017-09-13 ENCOUNTER — Ambulatory Visit: Payer: PRIVATE HEALTH INSURANCE | Admitting: Family Medicine

## 2017-09-13 VITALS — BP 140/80 | HR 110 | Temp 97.7°F | Ht 64.0 in | Wt 72.0 lb

## 2017-09-13 DIAGNOSIS — J432 Centrilobular emphysema: Secondary | ICD-10-CM | POA: Diagnosis not present

## 2017-09-13 DIAGNOSIS — J40 Bronchitis, not specified as acute or chronic: Secondary | ICD-10-CM

## 2017-09-13 DIAGNOSIS — I10 Essential (primary) hypertension: Secondary | ICD-10-CM

## 2017-09-13 DIAGNOSIS — R7303 Prediabetes: Secondary | ICD-10-CM

## 2017-09-13 MED ORDER — GUAIFENESIN-CODEINE 100-10 MG/5ML PO SYRP: 5.0000 mL | ORAL_SOLUTION | Freq: Three times a day (TID) | ORAL | 0 refills | Status: DC | PRN

## 2017-09-13 MED ORDER — PREDNISONE 10 MG PO TABS: 10.0000 mg | ORAL_TABLET | Freq: Every day | ORAL | 0 refills | Status: DC

## 2017-09-13 MED ORDER — LEVOFLOXACIN 500 MG PO TABS: 500.0000 mg | ORAL_TABLET | Freq: Every day | ORAL | 0 refills | Status: DC

## 2017-09-13 NOTE — Progress Notes (Signed)
Name: Lindsey Nguyen   MRN: [id number]    DOB: 1951/04/27   Date:09/13/2017       Progress Note  Subjective  Chief Complaint  Chief Complaint  Patient presents with  . Bronchitis    yellow/ green production, cough    Cough  This is a new problem. The current episode started more than 1 year ago. The problem occurs constantly. The cough is productive of purulent sputum. Pertinent negatives include no chest pain, chills, ear congestion, ear pain, fever, headaches, heartburn, hemoptysis, myalgias, nasal congestion, postnasal drip, rash, rhinorrhea, sore throat, shortness of breath, sweats, weight loss or wheezing. Nothing aggravates the symptoms. She has tried a beta-agonist inhaler, steroid inhaler and ipratropium inhaler for the symptoms. The treatment provided moderate relief. Her past medical history is significant for bronchitis and COPD. There is no history of environmental allergies.    No problem-specific Assessment & Plan notes found for this encounter.   Past Medical History:  Diagnosis Date  . Cancer [facility])    right breast cancer with lumpectomy and rad tx  . COPD (chronic obstructive pulmonary disease) ([place])   . Diabetes mellitus without complication ([place])   . Hypertension   . Osteoporosis     Past Surgical History:  Procedure Laterality Date  . BREAST LUMPECTOMY     x 2  . BUNIONECTOMY Right   . COLONOSCOPY     normal  . PERIPHERAL VASCULAR CATHETERIZATION N/A 04/10/2016   Procedure: Lower Extremity Angiography;  Surgeon: Katha Cabal, MD;  Location: [place] [facility];  Service: Cardiovascular;  Laterality: N/A;  . PERIPHERAL VASCULAR CATHETERIZATION  04/10/2016   Procedure: Lower Extremity Intervention;  Surgeon: Katha Cabal, MD;  Location: [place] [facility];  Service: Cardiovascular;;  . TUBAL LIGATION      Family History  Problem Relation Age of Onset  . Cancer Father   . Diabetes Father   . Cancer Brother   . Diabetes Paternal Aunt   .  Diabetes Paternal Uncle     Social History   Socioeconomic History  . Marital status: Divorced    Spouse name: Not on file  . Number of children: Not on file  . Years of education: Not on file  . Highest education level: Not on file  Social Needs  . Financial resource strain: Not on file  . Food insecurity - worry: Not on file  . Food insecurity - inability: Not on file  . Transportation needs - medical: Not on file  . Transportation needs - non-medical: Not on file  Occupational History  . Not on file  Tobacco Use  . Smoking status: Current Every Day Smoker  . Smokeless tobacco: Never Used  Substance and Sexual Activity  . Alcohol use: Yes    Alcohol/week: 0.0 oz  . Drug use: No  . Sexual activity: No  Other Topics Concern  . Not on file  Social History Narrative  . Not on file    Allergies  Allergen Reactions  . Tetanus Toxoids Swelling    Outpatient Medications Prior to Visit  Medication Sig Dispense Refill  . albuterol (PROVENTIL HFA;VENTOLIN HFA) 108 (90 Base) MCG/ACT inhaler Inhale 2 puffs into the lungs every 6 (six) hours as needed for wheezing or shortness of breath. 1 Inhaler 11  . Aspirin Effervescent (ALKA-SELTZER PO) Take 1 tablet by mouth daily.    . fluticasone furoate-vilanterol (BREO ELLIPTA) 200-25 MCG/INH AEPB Inhale 1 puff into the lungs daily. 1 each 3  .  guaiFENesin-codeine (ROBITUSSIN AC) 100-10 MG/5ML syrup Take 5 mLs by mouth 3 (three) times daily as needed for cough. 100 mL 0  . lisinopril-hydrochlorothiazide (PRINZIDE,ZESTORETIC) 10-12.5 MG tablet Take 1 tablet by mouth daily. 90 tablet 2  . loratadine (CLARITIN) 10 MG tablet TAKE 1 TABLET (10 MG TOTAL) BY MOUTH DAILY. 30 tablet 3  . metFORMIN (GLUCOPHAGE) 500 MG tablet Take 1 tablet (500 mg total) by mouth daily. 90 tablet 2  . nystatin-triamcinolone ointment (MYCOLOG) Apply 1 application topically 2 (two) times daily. 30 g 0  . tiotropium (SPIRIVA HANDIHALER) 18 MCG inhalation capsule Place  into inhaler and inhale.    . levofloxacin (LEVAQUIN) 500 MG tablet Take 1 tablet (500 mg total) by mouth daily. 7 tablet 0  . predniSONE (DELTASONE) 10 MG tablet Take 1 tablet (10 mg total) by mouth daily with breakfast. 30 tablet 0   No facility-administered medications prior to visit.     Review of Systems  Constitutional: Negative for chills, fever, malaise/fatigue and weight loss.  HENT: Negative for ear discharge, ear pain, postnasal drip, rhinorrhea and sore throat.   Eyes: Negative for blurred vision.  Respiratory: Positive for cough. Negative for hemoptysis, sputum production, shortness of breath and wheezing.   Cardiovascular: Negative for chest pain, palpitations and leg swelling.  Gastrointestinal: Negative for abdominal pain, blood in stool, constipation, diarrhea, heartburn, melena and nausea.  Genitourinary: Negative for dysuria, frequency, hematuria and urgency.  Musculoskeletal: Negative for back pain, joint pain, myalgias and neck pain.  Skin: Negative for rash.  Neurological: Negative for dizziness, tingling, sensory change, focal weakness and headaches.  Endo/Heme/Allergies: Negative for environmental allergies and polydipsia. Does not bruise/bleed easily.  Psychiatric/Behavioral: Negative for depression and suicidal ideas. The patient is not nervous/anxious and does not have insomnia.      Objective  Vitals:   09/13/17 0932  BP: 140/80  Pulse: (!) 110  Temp: 97.7 F (36.5 C)  SpO2: 99%  Weight: 72 lb (32.7 kg)  Height: 5\' 4"  (1.626 m)    Physical Exam  Constitutional: She is well-developed, well-nourished, and in no distress. No distress.  HENT:  Head: Normocephalic and atraumatic.  Right Ear: External ear normal.  Left Ear: External ear normal.  Nose: Nose normal.  Mouth/Throat: Oropharynx is clear and moist.  Eyes: Conjunctivae and EOM are normal. Pupils are equal, round, and reactive to light. Right eye exhibits no discharge. Left eye exhibits no  discharge.  Neck: Normal range of motion. Neck supple. No JVD present. No thyromegaly present.  Cardiovascular: Normal rate, regular rhythm, normal heart sounds and intact distal pulses. Exam reveals no gallop and no friction rub.  No murmur heard. Pulmonary/Chest: Effort normal. She has wheezes. She has no rales.  Abdominal: Soft. Bowel sounds are normal. She exhibits no mass. There is no tenderness. There is no guarding.  Musculoskeletal: Normal range of motion. She exhibits no edema.  Lymphadenopathy:    She has no cervical adenopathy.  Neurological: She is alert. She has normal reflexes.  Skin: Skin is warm and dry. She is not diaphoretic.  Psychiatric: Mood and affect normal.  Nursing note and vitals reviewed.     Assessment & Plan  Problem List Items Addressed This Visit      Respiratory   COLD (chronic obstructive lung disease) ([place])   Relevant Medications   tiotropium (SPIRIVA HANDIHALER) 18 MCG inhalation capsule   predniSONE (DELTASONE) 10 MG tablet   guaiFENesin-codeine (ROBITUSSIN AC) 100-10 MG/5ML syrup    Other Visit Diagnoses  Bronchitis    -  Primary      Meds ordered this encounter  Medications  . levofloxacin (LEVAQUIN) 500 MG tablet    Sig: Take 1 tablet (500 mg total) by mouth daily.    Dispense:  7 tablet    Refill:  0  . predniSONE (DELTASONE) 10 MG tablet    Sig: Take 1 tablet (10 mg total) by mouth daily with breakfast.    Dispense:  30 tablet    Refill:  0  . guaiFENesin-codeine (ROBITUSSIN AC) 100-10 MG/5ML syrup    Sig: Take 5 mLs by mouth 3 (three) times daily as needed for cough.    Dispense:  150 mL    Refill:  0      Dr. Otilio Miu [facility]  09/13/17

## 2017-09-13 NOTE — Addendum Note (Signed)
Addended by: Fredderick Severance on: 09/13/2017 04:55 PM   Modules accepted: Orders

## 2017-09-26 ENCOUNTER — Other Ambulatory Visit: Payer: Self-pay | Admitting: Family Medicine

## 2017-09-26 ENCOUNTER — Ambulatory Visit: Payer: PRIVATE HEALTH INSURANCE | Admitting: Family Medicine

## 2017-09-26 ENCOUNTER — Encounter: Payer: Self-pay | Admitting: Family Medicine

## 2017-09-26 VITALS — BP 130/80 | HR 84 | Ht 64.0 in | Wt 72.0 lb

## 2017-09-26 DIAGNOSIS — Z23 Encounter for immunization: Secondary | ICD-10-CM

## 2017-09-26 DIAGNOSIS — J432 Centrilobular emphysema: Secondary | ICD-10-CM

## 2017-09-26 DIAGNOSIS — Z1159 Encounter for screening for other viral diseases: Secondary | ICD-10-CM

## 2017-09-26 DIAGNOSIS — I10 Essential (primary) hypertension: Secondary | ICD-10-CM

## 2017-09-26 DIAGNOSIS — R7303 Prediabetes: Secondary | ICD-10-CM | POA: Diagnosis not present

## 2017-09-26 MED ORDER — ALBUTEROL SULFATE HFA 108 (90 BASE) MCG/ACT IN AERS: 2.0000 | INHALATION_SPRAY | Freq: Four times a day (QID) | RESPIRATORY_TRACT | 11 refills | Status: DC | PRN

## 2017-09-26 MED ORDER — LISINOPRIL-HYDROCHLOROTHIAZIDE 10-12.5 MG PO TABS: 1.0000 | ORAL_TABLET | Freq: Every day | ORAL | 2 refills | Status: DC

## 2017-09-26 MED ORDER — FLUTICASONE FUROATE-VILANTEROL 200-25 MCG/INH IN AEPB: 1.0000 | INHALATION_SPRAY | Freq: Every day | RESPIRATORY_TRACT | 6 refills | Status: DC

## 2017-09-26 MED ORDER — METFORMIN HCL 500 MG PO TABS: 500.0000 mg | ORAL_TABLET | Freq: Every day | ORAL | 2 refills | Status: DC

## 2017-09-26 NOTE — Progress Notes (Signed)
Name: Lindsey Nguyen   MRN: [id number]    DOB: 1951-05-16   Date:09/26/2017       Progress Note  Subjective  Chief Complaint  Chief Complaint  Patient presents with  . Hypertension  . Diabetes  . COPD  . Allergic Rhinitis     Hypertension  This is a chronic problem. The current episode started more than 1 year ago. The problem is unchanged. The problem is controlled. Pertinent negatives include no anxiety, blurred vision, chest pain, headaches, malaise/fatigue, neck pain, orthopnea, palpitations, peripheral edema, PND, shortness of breath or sweats. There are no associated agents to hypertension. There are no known risk factors for coronary artery disease. Past treatments include ACE inhibitors and diuretics. The current treatment provides moderate improvement. There are no compliance problems.  There is no history of angina, kidney disease, CAD/MI, CVA, heart failure, left ventricular hypertrophy, PVD or retinopathy. There is no history of chronic renal disease, a hypertension causing med or renovascular disease.  Diabetes  She presents for her follow-up diabetic visit. She has type 2 diabetes mellitus. Her disease course has been stable. Pertinent negatives for hypoglycemia include no confusion, dizziness, headaches, hunger, mood changes, nervousness/anxiousness, pallor, seizures, sleepiness, speech difficulty, sweats or tremors. Pertinent negatives for diabetes include no blurred vision, no chest pain, no fatigue, no foot paresthesias, no foot ulcerations, no polydipsia, no polyphagia, no polyuria, no visual change, no weakness and no weight loss. There are no hypoglycemic complications. Symptoms are stable. There are no diabetic complications. Pertinent negatives for diabetic complications include no CVA, PVD or retinopathy. There are no known risk factors for coronary artery disease. Current diabetic treatment includes oral agent (monotherapy). She is compliant with treatment most of the time.  She is following a generally healthy diet. She rarely participates in exercise. Home blood sugar record trend: no patient monitor. An ACE inhibitor/angiotensin II receptor blocker is being taken.  COPD  There is no chest tightness, cough, difficulty breathing, frequent throat clearing, hemoptysis, hoarse voice, shortness of breath, sputum production or wheezing. This is a chronic problem. The current episode started more than 1 year ago. The problem occurs intermittently. The problem has been waxing and waning. Pertinent negatives include no chest pain, ear pain, fever, headaches, heartburn, malaise/fatigue, myalgias, nasal congestion, PND, sneezing, sore throat, sweats or weight loss. Her symptoms are alleviated by beta-agonist, ipratropium and steroid inhaler. She reports moderate improvement on treatment. Risk factors for lung disease include smoking/tobacco exposure. Her past medical history is significant for COPD.    No problem-specific Assessment & Plan notes found for this encounter.   Past Medical History:  Diagnosis Date  . Cancer [facility])    right breast cancer with lumpectomy and rad tx  . COPD (chronic obstructive pulmonary disease) ([place])   . Diabetes mellitus without complication ([place])   . Hypertension   . Osteoporosis     Past Surgical History:  Procedure Laterality Date  . BREAST LUMPECTOMY     x 2  . BUNIONECTOMY Right   . COLONOSCOPY     normal  . PERIPHERAL VASCULAR CATHETERIZATION N/A 04/10/2016   Procedure: Lower Extremity Angiography;  Surgeon: Katha Cabal, MD;  Location: [place] [facility];  Service: Cardiovascular;  Laterality: N/A;  . PERIPHERAL VASCULAR CATHETERIZATION  04/10/2016   Procedure: Lower Extremity Intervention;  Surgeon: Katha Cabal, MD;  Location: [place] [facility];  Service: Cardiovascular;;  . TUBAL LIGATION      Family History  Problem Relation Age of Onset  .  Cancer Father   . Diabetes Father   . Cancer Brother   . Diabetes  Paternal Aunt   . Diabetes Paternal Uncle     Social History   Socioeconomic History  . Marital status: Divorced    Spouse name: Not on file  . Number of children: Not on file  . Years of education: Not on file  . Highest education level: Not on file  Social Needs  . Financial resource strain: Not on file  . Food insecurity - worry: Not on file  . Food insecurity - inability: Not on file  . Transportation needs - medical: Not on file  . Transportation needs - non-medical: Not on file  Occupational History  . Not on file  Tobacco Use  . Smoking status: Current Every Day Smoker  . Smokeless tobacco: Never Used  Substance and Sexual Activity  . Alcohol use: Yes    Alcohol/week: 0.0 oz  . Drug use: No  . Sexual activity: No  Other Topics Concern  . Not on file  Social History Narrative  . Not on file    Allergies  Allergen Reactions  . Tetanus Toxoids Swelling    Outpatient Medications Prior to Visit  Medication Sig Dispense Refill  . Aspirin Effervescent (ALKA-SELTZER PO) Take 1 tablet by mouth daily.    Marland Kitchen loratadine (CLARITIN) 10 MG tablet TAKE 1 TABLET (10 MG TOTAL) BY MOUTH DAILY. 30 tablet 3  . nystatin-triamcinolone ointment (MYCOLOG) Apply 1 application topically 2 (two) times daily. 30 g 0  . tiotropium (SPIRIVA HANDIHALER) 18 MCG inhalation capsule Place into inhaler and inhale.    . albuterol (PROVENTIL HFA;VENTOLIN HFA) 108 (90 Base) MCG/ACT inhaler Inhale 2 puffs into the lungs every 6 (six) hours as needed for wheezing or shortness of breath. 1 Inhaler 11  . fluticasone furoate-vilanterol (BREO ELLIPTA) 200-25 MCG/INH AEPB Inhale 1 puff into the lungs daily. 1 each 3  . lisinopril-hydrochlorothiazide (PRINZIDE,ZESTORETIC) 10-12.5 MG tablet Take 1 tablet by mouth daily. 90 tablet 2  . metFORMIN (GLUCOPHAGE) 500 MG tablet Take 1 tablet (500 mg total) by mouth daily. 90 tablet 2  . guaiFENesin-codeine (ROBITUSSIN AC) 100-10 MG/5ML syrup Take 5 mLs by mouth 3  (three) times daily as needed for cough. (Patient not taking: Reported on 09/26/2017) 100 mL 0  . predniSONE (DELTASONE) 10 MG tablet Take 1 tablet (10 mg total) by mouth daily with breakfast. 4,4,4,3,3,3,2,2,2,1,1,1 (Patient not taking: Reported on 09/26/2017) 30 tablet 0  . guaiFENesin-codeine (ROBITUSSIN AC) 100-10 MG/5ML syrup Take 5 mLs by mouth 3 (three) times daily as needed for cough. 150 mL 0  . levofloxacin (LEVAQUIN) 500 MG tablet Take 1 tablet (500 mg total) by mouth daily. 7 tablet 0   No facility-administered medications prior to visit.     Review of Systems  Constitutional: Negative for chills, fatigue, fever, malaise/fatigue and weight loss.  HENT: Negative for ear discharge, ear pain, hoarse voice, sneezing and sore throat.   Eyes: Negative for blurred vision.  Respiratory: Negative for cough, hemoptysis, sputum production, shortness of breath and wheezing.   Cardiovascular: Negative for chest pain, palpitations, orthopnea, leg swelling and PND.  Gastrointestinal: Negative for abdominal pain, blood in stool, constipation, diarrhea, heartburn, melena and nausea.  Genitourinary: Negative for dysuria, frequency, hematuria and urgency.  Musculoskeletal: Negative for back pain, joint pain, myalgias and neck pain.  Skin: Negative for pallor and rash.  Neurological: Negative for dizziness, tingling, tremors, sensory change, focal weakness, seizures, speech difficulty, weakness and headaches.  Endo/Heme/Allergies: Negative for environmental allergies, polydipsia and polyphagia. Does not bruise/bleed easily.  Psychiatric/Behavioral: Negative for confusion, depression and suicidal ideas. The patient is not nervous/anxious and does not have insomnia.      Objective  Vitals:   09/26/17 1401  BP: 130/80  Pulse: 84  Weight: 72 lb (32.7 kg)  Height: 5\' 4"  (1.626 m)    Physical Exam  Constitutional: She is well-developed, well-nourished, and in no distress. No distress.  HENT:   Head: Normocephalic and atraumatic.  Right Ear: External ear normal.  Left Ear: External ear normal.  Nose: Nose normal.  Mouth/Throat: Oropharynx is clear and moist.  Eyes: Conjunctivae and EOM are normal. Pupils are equal, round, and reactive to light. Right eye exhibits no discharge. Left eye exhibits no discharge.  Neck: Normal range of motion. Neck supple. No JVD present. No thyromegaly present.  Cardiovascular: Normal rate, regular rhythm, normal heart sounds and intact distal pulses. Exam reveals no gallop and no friction rub.  No murmur heard. Pulmonary/Chest: Effort normal and breath sounds normal. She has no wheezes. She has no rales.  Abdominal: Soft. Bowel sounds are normal. She exhibits no mass. There is no tenderness. There is no guarding.  Musculoskeletal: Normal range of motion. She exhibits no edema.  Lymphadenopathy:    She has no cervical adenopathy.  Neurological: She is alert. She has normal reflexes.  Skin: Skin is warm and dry. She is not diaphoretic.  Psychiatric: Mood and affect normal.  Nursing note and vitals reviewed.     Assessment & Plan  Problem List Items Addressed This Visit      Cardiovascular and Mediastinum   Essential hypertension   Relevant Medications   lisinopril-hydrochlorothiazide (PRINZIDE,ZESTORETIC) 10-12.5 MG tablet   Other Relevant Orders   Renal Function Panel     Respiratory   COLD (chronic obstructive lung disease) (HCC) - Primary   Relevant Medications   albuterol (PROVENTIL HFA;VENTOLIN HFA) 108 (90 Base) MCG/ACT inhaler   fluticasone furoate-vilanterol (BREO ELLIPTA) 200-25 MCG/INH AEPB     Other   Prediabetes   Relevant Medications   metFORMIN (GLUCOPHAGE) 500 MG tablet   Other Relevant Orders   Hemoglobin A1c   Renal Function Panel    Other Visit Diagnoses    Need for hepatitis C screening test       Relevant Orders   Hepatitis C antibody   Need for vaccination with 13-polyvalent pneumococcal conjugate vaccine        Relevant Orders   Pneumococcal conjugate vaccine 13-valent IM (Completed)   Influenza vaccine needed       rtc for influenza in 2 weeks      Meds ordered this encounter  Medications  . lisinopril-hydrochlorothiazide (PRINZIDE,ZESTORETIC) 10-12.5 MG tablet    Sig: Take 1 tablet by mouth daily.    Dispense:  90 tablet    Refill:  2  . metFORMIN (GLUCOPHAGE) 500 MG tablet    Sig: Take 1 tablet (500 mg total) by mouth daily.    Dispense:  90 tablet    Refill:  2  . albuterol (PROVENTIL HFA;VENTOLIN HFA) 108 (90 Base) MCG/ACT inhaler    Sig: Inhale 2 puffs into the lungs every 6 (six) hours as needed for wheezing or shortness of breath.    Dispense:  1 Inhaler    Refill:  11  . fluticasone furoate-vilanterol (BREO ELLIPTA) 200-25 MCG/INH AEPB    Sig: Inhale 1 puff into the lungs daily.    Dispense:  1 each  Refill:  6    Okay to dispense for 3 months      Dr. Otilio Miu [facility]  09/26/17

## 2017-09-27 LAB — RENAL FUNCTION PANEL
ALBUMIN: 5.3 g/dL — AB (ref 3.6–4.8)
BUN/Creatinine Ratio: 17 (ref 12–28)
BUN: 12 mg/dL (ref 8–27)
CO2: 23 mmol/L (ref 20–29)
CREATININE: 0.7 mg/dL (ref 0.57–1.00)
Calcium: 11.6 mg/dL — ABNORMAL HIGH (ref 8.7–10.3)
Chloride: 84 mmol/L — ABNORMAL LOW (ref 96–106)
GFR calc Af Amer: 105 mL/min/{1.73_m2} (ref >59)
GFR, EST NON AFRICAN AMERICAN: 91 mL/min/{1.73_m2} (ref >59)
GLUCOSE: 72 mg/dL (ref 65–99)
PHOSPHORUS: 4.5 mg/dL (ref 2.5–4.5)
POTASSIUM: 4.8 mmol/L (ref 3.5–5.2)
Sodium: 127 mmol/L — ABNORMAL LOW (ref 134–144)

## 2017-09-27 LAB — HEMOGLOBIN A1C
ESTIMATED AVERAGE GLUCOSE: 128 mg/dL
HEMOGLOBIN A1C: 6.1 % — AB (ref 4.8–5.6)

## 2017-09-27 LAB — HEPATITIS C ANTIBODY

## 2017-09-30 LAB — SPECIMEN STATUS REPORT

## 2017-09-30 LAB — TSH: TSH: 0.986 u[IU]/mL (ref 0.450–4.500)

## 2017-10-10 ENCOUNTER — Other Ambulatory Visit: Payer: Self-pay | Admitting: Family Medicine

## 2017-10-28 ENCOUNTER — Ambulatory Visit (INDEPENDENT_AMBULATORY_CARE_PROVIDER_SITE_OTHER): Payer: PRIVATE HEALTH INSURANCE | Admitting: Family Medicine

## 2017-10-28 ENCOUNTER — Encounter: Payer: Self-pay | Admitting: Family Medicine

## 2017-10-28 ENCOUNTER — Encounter: Payer: Self-pay | Admitting: Intensive Care

## 2017-10-28 ENCOUNTER — Emergency Department: Payer: 59

## 2017-10-28 ENCOUNTER — Other Ambulatory Visit: Payer: Self-pay

## 2017-10-28 ENCOUNTER — Inpatient Hospital Stay
Admission: EM | Admit: 2017-10-28 | Discharge: 2017-10-31 | DRG: 193 | Disposition: A | Discharge status: Home / Self Care | Payer: 59 | Attending: Specialist | Admitting: Specialist

## 2017-10-28 VITALS — BP 138/80 | HR 115 | Temp 99.5°F | Ht 64.0 in | Wt 71.0 lb

## 2017-10-28 DIAGNOSIS — E860 Dehydration: Secondary | ICD-10-CM

## 2017-10-28 DIAGNOSIS — E119 Type 2 diabetes mellitus without complications: Secondary | ICD-10-CM

## 2017-10-28 DIAGNOSIS — J1008 Influenza due to other identified influenza virus with other specified pneumonia: Secondary | ICD-10-CM | POA: Diagnosis not present

## 2017-10-28 DIAGNOSIS — J189 Pneumonia, unspecified organism: Secondary | ICD-10-CM

## 2017-10-28 DIAGNOSIS — J440 Chronic obstructive pulmonary disease with acute lower respiratory infection: Secondary | ICD-10-CM | POA: Diagnosis present

## 2017-10-28 DIAGNOSIS — J181 Lobar pneumonia, unspecified organism: Secondary | ICD-10-CM | POA: Diagnosis present

## 2017-10-28 DIAGNOSIS — J9621 Acute and chronic respiratory failure with hypoxia: Secondary | ICD-10-CM | POA: Diagnosis present

## 2017-10-28 DIAGNOSIS — R0602 Shortness of breath: Secondary | ICD-10-CM | POA: Diagnosis not present

## 2017-10-28 DIAGNOSIS — I10 Essential (primary) hypertension: Secondary | ICD-10-CM | POA: Diagnosis present

## 2017-10-28 DIAGNOSIS — E222 Syndrome of inappropriate secretion of antidiuretic hormone: Secondary | ICD-10-CM | POA: Diagnosis present

## 2017-10-28 DIAGNOSIS — Z7951 Long term (current) use of inhaled steroids: Secondary | ICD-10-CM | POA: Diagnosis not present

## 2017-10-28 DIAGNOSIS — R64 Cachexia: Secondary | ICD-10-CM | POA: Diagnosis present

## 2017-10-28 DIAGNOSIS — Z7984 Long term (current) use of oral hypoglycemic drugs: Secondary | ICD-10-CM | POA: Diagnosis not present

## 2017-10-28 DIAGNOSIS — F17210 Nicotine dependence, cigarettes, uncomplicated: Secondary | ICD-10-CM | POA: Diagnosis present

## 2017-10-28 DIAGNOSIS — Z7982 Long term (current) use of aspirin: Secondary | ICD-10-CM

## 2017-10-28 DIAGNOSIS — Z853 Personal history of malignant neoplasm of breast: Secondary | ICD-10-CM

## 2017-10-28 DIAGNOSIS — Z923 Personal history of irradiation: Secondary | ICD-10-CM

## 2017-10-28 DIAGNOSIS — J432 Centrilobular emphysema: Secondary | ICD-10-CM | POA: Diagnosis not present

## 2017-10-28 DIAGNOSIS — J441 Chronic obstructive pulmonary disease with (acute) exacerbation: Secondary | ICD-10-CM | POA: Diagnosis present

## 2017-10-28 DIAGNOSIS — E876 Hypokalemia: Secondary | ICD-10-CM | POA: Diagnosis present

## 2017-10-28 DIAGNOSIS — Z6827 Body mass index (BMI) 27.0-27.9, adult: Secondary | ICD-10-CM

## 2017-10-28 DIAGNOSIS — E869 Volume depletion, unspecified: Secondary | ICD-10-CM | POA: Diagnosis present

## 2017-10-28 DIAGNOSIS — Z887 Allergy status to serum and vaccine status: Secondary | ICD-10-CM

## 2017-10-28 DIAGNOSIS — Z7952 Long term (current) use of systemic steroids: Secondary | ICD-10-CM

## 2017-10-28 LAB — TROPONIN I: TROPONIN I: 0.05 ng/mL — AB (ref <0.03)

## 2017-10-28 LAB — BASIC METABOLIC PANEL
ANION GAP: 14 (ref 5–15)
BUN: 26 mg/dL — ABNORMAL HIGH (ref 6–20)
CO2: 24 mmol/L (ref 22–32)
Calcium: 8 mg/dL — ABNORMAL LOW (ref 8.9–10.3)
Chloride: 81 mmol/L — ABNORMAL LOW (ref 101–111)
Creatinine, Ser: 0.63 mg/dL (ref 0.44–1.00)
GFR calc Af Amer: >60 mL/min (ref >60)
GLUCOSE: 139 mg/dL — AB (ref 65–99)
POTASSIUM: 2.9 mmol/L — AB (ref 3.5–5.1)
Sodium: 119 mmol/L — CL (ref 135–145)

## 2017-10-28 LAB — INFLUENZA PANEL BY PCR (TYPE A & B)
INFLAPCR: POSITIVE — AB
Influenza B By PCR: NEGATIVE

## 2017-10-28 LAB — CBC
HCT: 36.9 % (ref 35.0–47.0)
HEMOGLOBIN: 12.5 g/dL (ref 12.0–16.0)
MCH: 31.1 pg (ref 26.0–34.0)
MCHC: 33.8 g/dL (ref 32.0–36.0)
MCV: 92.1 fL (ref 80.0–100.0)
PLATELETS: 296 10*3/uL (ref 150–440)
RBC: 4.01 MIL/uL (ref 3.80–5.20)
RDW: 14.3 % (ref 11.5–14.5)
WBC: 12.9 10*3/uL — ABNORMAL HIGH (ref 3.6–11.0)

## 2017-10-28 LAB — LACTIC ACID, PLASMA
LACTIC ACID, VENOUS: 1.3 mmol/L (ref 0.5–1.9)
Lactic Acid, Venous: 1.5 mmol/L (ref 0.5–1.9)

## 2017-10-28 LAB — GLUCOSE, CAPILLARY: Glucose-Capillary: 140 mg/dL — ABNORMAL HIGH (ref 65–99)

## 2017-10-28 MED ORDER — ACETAMINOPHEN 325 MG PO TABS: 650.0000 mg | ORAL_TABLET | Freq: Four times a day (QID) | ORAL | Status: DC | PRN

## 2017-10-28 MED ORDER — SODIUM CHLORIDE 0.9 % IV BOLUS (SEPSIS)
1000.0000 mL | Freq: Once | INTRAVENOUS | Status: AC
  Administered 2017-10-28: 1000 mL via INTRAVENOUS

## 2017-10-28 MED ORDER — INSULIN ASPART 100 UNIT/ML ~~LOC~~ SOLN: 0.0000 [IU] | Freq: Every day | SUBCUTANEOUS | Status: DC

## 2017-10-28 MED ORDER — LORATADINE 10 MG PO TABS
10.0000 mg | ORAL_TABLET | Freq: Every day | ORAL | Status: DC
  Filled 2017-10-28 (×2): qty 1

## 2017-10-28 MED ORDER — IPRATROPIUM-ALBUTEROL 0.5-2.5 (3) MG/3ML IN SOLN
3.0000 mL | Freq: Four times a day (QID) | RESPIRATORY_TRACT | Status: DC
  Administered 2017-10-29 (×3): 3 mL via RESPIRATORY_TRACT
  Filled 2017-10-28 (×6): qty 3

## 2017-10-28 MED ORDER — CEFTRIAXONE SODIUM IN DEXTROSE 20 MG/ML IV SOLN
1.0000 g | Freq: Once | INTRAVENOUS | Status: AC
  Administered 2017-10-28: 1 g via INTRAVENOUS
  Filled 2017-10-28: qty 50

## 2017-10-28 MED ORDER — ONDANSETRON HCL 4 MG PO TABS: 4.0000 mg | ORAL_TABLET | Freq: Four times a day (QID) | ORAL | Status: DC | PRN

## 2017-10-28 MED ORDER — SODIUM CHLORIDE 0.9 % IV SOLN
INTRAVENOUS | Status: DC
  Administered 2017-10-28: 23:00:00 via INTRAVENOUS

## 2017-10-28 MED ORDER — METHYLPREDNISOLONE SODIUM SUCC 125 MG IJ SOLR
125.0000 mg | Freq: Once | INTRAMUSCULAR | Status: AC
  Administered 2017-10-28: 125 mg via INTRAVENOUS
  Filled 2017-10-28: qty 2

## 2017-10-28 MED ORDER — INSULIN ASPART 100 UNIT/ML ~~LOC~~ SOLN
0.0000 [IU] | Freq: Three times a day (TID) | SUBCUTANEOUS | Status: DC
  Administered 2017-10-29 – 2017-10-30 (×6): 2 [IU] via SUBCUTANEOUS
  Administered 2017-10-31: 1 [IU] via SUBCUTANEOUS
  Filled 2017-10-28 (×7): qty 1

## 2017-10-28 MED ORDER — LISINOPRIL-HYDROCHLOROTHIAZIDE 10-12.5 MG PO TABS: 1.0000 | ORAL_TABLET | Freq: Every day | ORAL | Status: DC

## 2017-10-28 MED ORDER — DEXTROSE 5 % IV SOLN
500.0000 mg | Freq: Once | INTRAVENOUS | Status: AC
  Administered 2017-10-28: 500 mg via INTRAVENOUS
  Filled 2017-10-28: qty 500

## 2017-10-28 MED ORDER — HYDROCHLOROTHIAZIDE 12.5 MG PO CAPS
12.5000 mg | ORAL_CAPSULE | Freq: Every day | ORAL | Status: DC
  Administered 2017-10-29: 12.5 mg via ORAL
  Filled 2017-10-28: qty 1

## 2017-10-28 MED ORDER — ACETAMINOPHEN 650 MG RE SUPP: 650.0000 mg | Freq: Four times a day (QID) | RECTAL | Status: DC | PRN

## 2017-10-28 MED ORDER — ENOXAPARIN SODIUM 40 MG/0.4ML ~~LOC~~ SOLN
40.0000 mg | SUBCUTANEOUS | Status: DC
  Administered 2017-10-28 – 2017-10-30 (×3): 40 mg via SUBCUTANEOUS
  Filled 2017-10-28 (×3): qty 0.4

## 2017-10-28 MED ORDER — METFORMIN HCL 500 MG PO TABS
500.0000 mg | ORAL_TABLET | Freq: Every day | ORAL | Status: DC
  Administered 2017-10-29 – 2017-10-31 (×3): 500 mg via ORAL
  Filled 2017-10-28 (×3): qty 1

## 2017-10-28 MED ORDER — BUDESONIDE 0.5 MG/2ML IN SUSP
0.5000 mg | Freq: Two times a day (BID) | RESPIRATORY_TRACT | Status: DC
  Administered 2017-10-29 (×2): 0.5 mg via RESPIRATORY_TRACT
  Filled 2017-10-28 (×3): qty 2

## 2017-10-28 MED ORDER — METHYLPREDNISOLONE SODIUM SUCC 40 MG IJ SOLR
40.0000 mg | Freq: Two times a day (BID) | INTRAMUSCULAR | Status: DC
  Administered 2017-10-29 – 2017-10-31 (×5): 40 mg via INTRAVENOUS
  Filled 2017-10-28 (×5): qty 1

## 2017-10-28 MED ORDER — DEXTROSE 5 % IV SOLN
1.0000 g | INTRAVENOUS | Status: DC
  Administered 2017-10-29 – 2017-10-30 (×2): 1 g via INTRAVENOUS
  Filled 2017-10-28 (×3): qty 10

## 2017-10-28 MED ORDER — AZITHROMYCIN 250 MG PO TABS
250.0000 mg | ORAL_TABLET | Freq: Every day | ORAL | Status: DC
  Administered 2017-10-29 – 2017-10-31 (×3): 250 mg via ORAL
  Filled 2017-10-28 (×3): qty 1

## 2017-10-28 MED ORDER — LISINOPRIL 10 MG PO TABS
10.0000 mg | ORAL_TABLET | Freq: Every day | ORAL | Status: DC
Start: 2017-10-29 — End: 2017-10-31
  Administered 2017-10-29 – 2017-10-31 (×3): 10 mg via ORAL
  Filled 2017-10-28 (×3): qty 1

## 2017-10-28 MED ORDER — POTASSIUM CHLORIDE 10 MEQ/100ML IV SOLN
10.0000 meq | INTRAVENOUS | Status: DC
  Administered 2017-10-28 – 2017-10-29 (×3): 10 meq via INTRAVENOUS
  Filled 2017-10-28 (×4): qty 100

## 2017-10-28 MED ORDER — ONDANSETRON HCL 4 MG/2ML IJ SOLN: 4.0000 mg | Freq: Four times a day (QID) | INTRAMUSCULAR | Status: DC | PRN

## 2017-10-28 NOTE — ED Triage Notes (Signed)
Patient reports SOB for a couple days. Does not normally wear O2 at home. EMS had patient on 2L O2 upon arrival to ER. Patient was at [facility] clinic for increased SOB, dry mouth X1 week, and productive cough. Everyday smoker. HX HTN

## 2017-10-28 NOTE — ED Provider Notes (Addendum)
[facility] Emergency Department Provider Note  ____________________________________________   I have reviewed the triage vital signs and the nursing notes. Where available I have reviewed prior notes and, if possible and indicated, outside hospital notes.    HISTORY  Chief Complaint Shortness of Breath    HPI Lindsey Nguyen is a 67 y.o. female with a history of COPD, not on home oxygen, no recent hospitalizations, history of diabetes mellitus, density with cough for the last 3 days which is productive, no fevers noted at home, which her primary care doctor and was told that she likely had pneumonia came in chest x-ray shows pneumonia.  Patient states she does have a productive cough.  She denies chest pain, leg swelling, recent travel, she denies nausea vomiting diarrhea, decreased p.o. recently however.     Past Medical History:  Diagnosis Date  . Cancer [facility])    right breast cancer with lumpectomy and rad tx  . COPD (chronic obstructive pulmonary disease) ([place])   . Diabetes mellitus without complication ([place])   . Hypertension   . Osteoporosis     Patient Active Problem List   Diagnosis Date Noted  . Diabetes mellitus without complication ([place]) 81/19/1478  . Aortic atherosclerosis ([place]) 05/08/2016  . Allergic rhinitis due to pollen 05/08/2016  . Excessive weight loss 02/28/2016  . Essential hypertension 05/19/2015  . Prediabetes 05/19/2015  . Osteoporosis 05/19/2015  . COLD (chronic obstructive lung disease) ([place]) 05/19/2015    Past Surgical History:  Procedure Laterality Date  . BREAST LUMPECTOMY     x 2  . BUNIONECTOMY Right   . COLONOSCOPY     normal  . PERIPHERAL VASCULAR CATHETERIZATION N/A 04/10/2016   Procedure: Lower Extremity Angiography;  Surgeon: Katha Cabal, MD;  Location: [place] [facility];  Service: Cardiovascular;  Laterality: N/A;  . PERIPHERAL VASCULAR CATHETERIZATION  04/10/2016   Procedure: Lower Extremity  Intervention;  Surgeon: Katha Cabal, MD;  Location: [place] [facility];  Service: Cardiovascular;;  . TUBAL LIGATION      Prior to Admission medications   Medication Sig Start Date End Date Taking? Authorizing Provider  albuterol (PROVENTIL HFA;VENTOLIN HFA) 108 (90 Base) MCG/ACT inhaler Inhale 2 puffs into the lungs every 6 (six) hours as needed for wheezing or shortness of breath. 09/26/17   Juline Patch, MD  Aspirin Effervescent (ALKA-SELTZER PO) Take 1 tablet by mouth daily.    [provider]  fluticasone furoate-vilanterol (BREO ELLIPTA) 200-25 MCG/INH AEPB Inhale 1 puff into the lungs daily. 09/26/17   Juline Patch, MD  guaiFENesin-codeine (ROBITUSSIN AC) 100-10 MG/5ML syrup Take 5 mLs by mouth 3 (three) times daily as needed for cough. Patient not taking: Reported on 09/26/2017 07/23/17   Juline Patch, MD  lisinopril-hydrochlorothiazide (PRINZIDE,ZESTORETIC) 10-12.5 MG tablet Take 1 tablet by mouth daily. 09/26/17   Juline Patch, MD  loratadine (CLARITIN) 10 MG tablet TAKE 1 TABLET (10 MG TOTAL) BY MOUTH DAILY. 03/25/17   Juline Patch, MD  metFORMIN (GLUCOPHAGE) 500 MG tablet Take 1 tablet (500 mg total) by mouth daily. 09/26/17   Juline Patch, MD  nystatin-triamcinolone ointment [facility]) Apply 1 application topically 2 (two) times daily. 11/22/16   Juline Patch, MD  predniSONE (DELTASONE) 10 MG tablet Take 1 tablet (10 mg total) by mouth daily with breakfast. 4,4,4,3,3,3,2,2,2,1,1,1 Patient not taking: Reported on 09/26/2017 09/13/17   Juline Patch, MD  predniSONE (DELTASONE) 10 MG tablet TAKE 1 TABLET (10 MG TOTAL) BY  MOUTH DAILY WITH BREAKFAST. 10/10/17   Juline Patch, MD  tiotropium (SPIRIVA HANDIHALER) 18 MCG inhalation capsule Place into inhaler and inhale. 08/05/17 08/05/18  [provider]    Allergies Tetanus toxoids  Family History  Problem Relation Age of Onset  . Cancer Father   . Diabetes Father   . Cancer Brother   .  Diabetes Paternal Aunt   . Diabetes Paternal Uncle     Social History Social History   Tobacco Use  . Smoking status: Current Every Day Smoker    Packs/day: 1.00    Types: Cigarettes  . Smokeless tobacco: Never Used  Substance Use Topics  . Alcohol use: Yes    Alcohol/week: 12.6 oz    Types: 7 Cans of beer, 14 Glasses of wine per week  . Drug use: No    Review of Systems Constitutional: No fever/chills Eyes: No visual changes. ENT: No sore throat. No stiff neck no neck pain Cardiovascular: Denies chest pain. Respiratory: +shortness of breath. Gastrointestinal:   no vomiting.  No diarrhea.  No constipation. Genitourinary: Negative for dysuria. Musculoskeletal: Negative lower extremity swelling Skin: Negative for rash. Neurological: Negative for severe headaches, focal weakness or numbness.   ____________________________________________   PHYSICAL EXAM:  VITAL SIGNS: ED Triage Vitals  Enc Vitals Group     BP 10/28/17 1541 (!) 166/83     Pulse Rate 10/28/17 1541 (!) 105     Resp 10/28/17 1541 (!) 22     Temp 10/28/17 1541 99.1 F (37.3 C)     Temp Source 10/28/17 1541 Oral     SpO2 10/28/17 1541 98 %     Weight 10/28/17 1542 71 lb (32.2 kg)     Height 10/28/17 1542 5\' 4"  (1.626 m)     Head Circumference --      Peak Flow --      Pain Score 10/28/17 1542 2     Pain Loc --      Pain Edu? --      Excl. in [place]? --     Constitutional: Alert and oriented. Well appearing and in no acute distress. Eyes: Conjunctivae are normal Head: Atraumatic HEENT: No congestion/rhinnorhea. Mucous membranes are somewhat dry.  Oropharynx non-erythematous Neck:   Nontender with no meningismus, no masses, no stridor Cardiovascular: Tachycardia, regular rhythm. Grossly normal heart sounds.  Good peripheral circulation. Respiratory: Normal respiratory effort.  No retractions.  Diffuse rhonchi appreciated I do not hear many wheezes or rales. Abdominal: Soft and nontender. No  distention. No guarding no rebound Back:  There is no focal tenderness or step off.  there is no midline tenderness there are no lesions noted. there is no CVA tenderness Musculoskeletal: No lower extremity tenderness, no upper extremity tenderness. No joint effusions, no DVT signs strong distal pulses no edema Neurologic:  Normal speech and language. No gross focal neurologic deficits are appreciated.  Skin:  Skin is warm, dry and intact. No rash noted. Psychiatric: Mood and affect are normal. Speech and behavior are normal.  ____________________________________________   LABS (all labs ordered are listed, but only abnormal results are displayed)  Labs Reviewed  CBC - Abnormal; Notable for the following components:      Result Value   WBC 12.9 (*)    All other components within normal limits  CULTURE, BLOOD (ROUTINE X 2)  CULTURE, BLOOD (ROUTINE X 2)  BASIC METABOLIC PANEL  TROPONIN I  INFLUENZA PANEL BY PCR (TYPE A & B)  LACTIC ACID, PLASMA  LACTIC ACID, PLASMA    Pertinent labs  results that were available during my care of the patient were reviewed by me and considered in my medical decision making (see chart for details). ____________________________________________  EKG  I personally interpreted any EKGs ordered by me or triage Sinus tachycardia RAD, no acute ST elevation or depression nonspecific ST change ____________________________________________  RADIOLOGY  Pertinent labs & imaging results that were available during my care of the patient were reviewed by me and considered in my medical decision making (see chart for details). If possible, patient and/or family made aware of any abnormal findings.  Dg Chest 2 View  Result Date: 10/28/2017 CLINICAL DATA:  Increased shortness of breath and dry mouth for 1 week. RIGHT-sided breast cancer. EXAM: CHEST  2 VIEW COMPARISON:  02/28/2016. FINDINGS: Dense consolidation RIGHT middle lobe consistent with acute pneumonia.  Moderate-sized RIGHT pleural effusion. Patchy airspace opacity on the LEFT laterally at the base could represent superimposed LEFT lower lobe infiltrate. Small LEFT effusion. Normal cardiomediastinal silhouette. Hyperinflation. No bony abnormality. Worsening aeration from priors. IMPRESSION: Dense consolidation RIGHT middle lobe consistent with acute pneumonia and associated RIGHT pleural effusion. Patchy airspace opacity LEFT CP angle could represent small associated LEFT lower lobe infiltrate. Electronically Signed   By: Staci Righter M.D.   On: 10/28/2017 16:21   ____________________________________________    PROCEDURES  Procedure(s) performed: None  Procedures  Critical Care performed: CRITICAL CARE Performed by: Schuyler Amor   Total critical care time: 36 minutes  Critical care time was exclusive of separately billable procedures and treating other patients.  Critical care was necessary to treat or prevent imminent or life-threatening deterioration.  Critical care was time spent personally by me on the following activities: development of treatment plan with patient and/or surrogate as well as nursing, discussions with consultants, evaluation of patient's response to treatment, examination of patient, obtaining history from patient or surrogate, ordering and performing treatments and interventions, ordering and review of laboratory studies, ordering and review of radiographic studies, pulse oximetry and re-evaluation of patient's condition.   ____________________________________________   INITIAL IMPRESSION / ASSESSMENT AND PLAN / ED COURSE  Pertinent labs & imaging results that were available during my care of the patient were reviewed by me and considered in my medical decision making (see chart for details).  Here with cough, chest x-ray shows multilobar pneumonia.  We are giving her IV fluid, blood cultures and lactic will be obtained although clinically she is mildly  tachycardic she does not appear otherwise septic, we will nonetheless give IV fluids, I will obtain lactic, and we will give her antibiotics is for community-acquired pneumonia.  She is hypoxic and requires admission for that reason at the least.  Given hypoxia we will also give Solu-Medrol and breathing treatments as needed  ----------------------------------------- 9:51 PM on 10/28/2017 -----------------------------------------  Pt flu positive. hospitalists was made aware as result came back after she went upstairs. Also bmp still pending. I have called the lab and made hospitalist aware, they are working on bmp    ____________________________________________   FINAL CLINICAL IMPRESSION(S) / ED DIAGNOSES  Final diagnoses:  Community acquired pneumonia, unspecified laterality      This chart was dictated using voice recognition software.  Despite best efforts to proofread,  errors can occur which can change meaning.      Schuyler Amor, MD 10/28/17 Mauro Kaufmann    Schuyler Amor, MD 10/28/17 2152

## 2017-10-28 NOTE — Progress Notes (Signed)
CRITICAL VALUE ALERT  Critical Value:  Troponin  Date & Time Notied:  10/28/2017  Provider Notified: Dr. Jannifer Franklin  Orders Received/Actions taken: patient asymptomatic. Checked serial troponin

## 2017-10-28 NOTE — Progress Notes (Signed)
CRITICAL VALUE ALERT  Critical Value:  Sodium 119 and potassium 2.9  Date & Time Notied:  10/28/2017  Provider Notified: text page Dr. Jannifer Franklin   Orders Received/Actions taken: Will start IV fluids and will give potassium rider. RN will continue to monitor.

## 2017-10-28 NOTE — ED Notes (Signed)
Floor cannot take report at the 15 min mark

## 2017-10-28 NOTE — Progress Notes (Signed)
Name: Lindsey Nguyen   MRN: [id number]    DOB: 08-May-1951   Date:10/28/2017       Progress Note  Subjective  Chief Complaint  No chief complaint on file.   Cough  This is a recurrent problem. The current episode started in the past 7 days. The problem has been gradually worsening. The problem occurs every few minutes. The cough is productive of purulent sputum (Just phlem). Associated symptoms include myalgias, shortness of breath and wheezing. Pertinent negatives include no chest pain, chills, ear congestion, ear pain, fever, headaches, heartburn, hemoptysis, nasal congestion, postnasal drip, rash, rhinorrhea, sore throat, sweats or weight loss. Exacerbated by: ambulation/movement. Treatments tried: breo/spireva. The treatment provided mild relief. Her past medical history is significant for COPD and emphysema. There is no history of asthma, bronchiectasis, bronchitis, environmental allergies or pneumonia. Hx of severe COPD/mitral insufficiency  Shortness of Breath  This is a recurrent problem. The current episode started in the past 7 days (about aweek). The problem occurs constantly. The problem has been gradually worsening. Associated symptoms include wheezing. Pertinent negatives include no abdominal pain, chest pain, claudication, coryza, ear pain, fever, headaches, hemoptysis, leg pain, leg swelling, neck pain, orthopnea, PND, rash, rhinorrhea, sore throat, sputum production, swollen glands, syncope or vomiting. Exacerbated by: continues to smoke. Risk factors include smoking. She has tried beta agonist inhalers and ipratropium inhalers (breo/spireva) for the symptoms. The treatment provided no relief. Her past medical history is significant for COPD and a heart failure. There is no history of asthma, CAD, chronic lung disease, DVT or pneumonia. (Hx of severe COPD/mitral insufficiency)    No problem-specific Assessment & Plan notes found for this encounter.   Past Medical History:  Diagnosis  Date  . Cancer [facility])    right breast cancer with lumpectomy and rad tx  . COPD (chronic obstructive pulmonary disease) ([place])   . Diabetes mellitus without complication ([place])   . Hypertension   . Osteoporosis     Past Surgical History:  Procedure Laterality Date  . BREAST LUMPECTOMY     x 2  . BUNIONECTOMY Right   . COLONOSCOPY     normal  . PERIPHERAL VASCULAR CATHETERIZATION N/A 04/10/2016   Procedure: Lower Extremity Angiography;  Surgeon: Katha Cabal, MD;  Location: [place] [facility];  Service: Cardiovascular;  Laterality: N/A;  . PERIPHERAL VASCULAR CATHETERIZATION  04/10/2016   Procedure: Lower Extremity Intervention;  Surgeon: Katha Cabal, MD;  Location: [place] [facility];  Service: Cardiovascular;;  . TUBAL LIGATION      Family History  Problem Relation Age of Onset  . Cancer Father   . Diabetes Father   . Cancer Brother   . Diabetes Paternal Aunt   . Diabetes Paternal Uncle     Social History   Socioeconomic History  . Marital status: Divorced    Spouse name: Not on file  . Number of children: Not on file  . Years of education: Not on file  . Highest education level: Not on file  Social Needs  . Financial resource strain: Not on file  . Food insecurity - worry: Not on file  . Food insecurity - inability: Not on file  . Transportation needs - medical: Not on file  . Transportation needs - non-medical: Not on file  Occupational History  . Not on file  Tobacco Use  . Smoking status: Current Every Day Smoker  . Smokeless tobacco: Never Used  Substance and Sexual Activity  . Alcohol use: Yes  Alcohol/week: 0.0 oz  . Drug use: No  . Sexual activity: No  Other Topics Concern  . Not on file  Social History Narrative  . Not on file    Allergies  Allergen Reactions  . Tetanus Toxoids Swelling    Outpatient Medications Prior to Visit  Medication Sig Dispense Refill  . albuterol (PROVENTIL HFA;VENTOLIN HFA) 108 (90 Base) MCG/ACT  inhaler Inhale 2 puffs into the lungs every 6 (six) hours as needed for wheezing or shortness of breath. 1 Inhaler 11  . Aspirin Effervescent (ALKA-SELTZER PO) Take 1 tablet by mouth daily.    . fluticasone furoate-vilanterol (BREO ELLIPTA) 200-25 MCG/INH AEPB Inhale 1 puff into the lungs daily. 1 each 6  . guaiFENesin-codeine (ROBITUSSIN AC) 100-10 MG/5ML syrup Take 5 mLs by mouth 3 (three) times daily as needed for cough. (Patient not taking: Reported on 09/26/2017) 100 mL 0  . lisinopril-hydrochlorothiazide (PRINZIDE,ZESTORETIC) 10-12.5 MG tablet Take 1 tablet by mouth daily. 90 tablet 2  . loratadine (CLARITIN) 10 MG tablet TAKE 1 TABLET (10 MG TOTAL) BY MOUTH DAILY. 30 tablet 3  . metFORMIN (GLUCOPHAGE) 500 MG tablet Take 1 tablet (500 mg total) by mouth daily. 90 tablet 2  . nystatin-triamcinolone ointment (MYCOLOG) Apply 1 application topically 2 (two) times daily. 30 g 0  . predniSONE (DELTASONE) 10 MG tablet Take 1 tablet (10 mg total) by mouth daily with breakfast. 4,4,4,3,3,3,2,2,2,1,1,1 (Patient not taking: Reported on 09/26/2017) 30 tablet 0  . predniSONE (DELTASONE) 10 MG tablet TAKE 1 TABLET (10 MG TOTAL) BY MOUTH DAILY WITH BREAKFAST. 14 tablet 0  . tiotropium (SPIRIVA HANDIHALER) 18 MCG inhalation capsule Place into inhaler and inhale.     No facility-administered medications prior to visit.     Review of Systems  Constitutional: Negative for chills, fever, malaise/fatigue and weight loss.  HENT: Negative for ear discharge, ear pain, postnasal drip, rhinorrhea and sore throat.   Eyes: Negative for blurred vision.  Respiratory: Positive for cough, shortness of breath and wheezing. Negative for hemoptysis and sputum production.   Cardiovascular: Negative for chest pain, palpitations, orthopnea, claudication, leg swelling, syncope and PND.  Gastrointestinal: Negative for abdominal pain, blood in stool, constipation, diarrhea, heartburn, melena, nausea and vomiting.  Genitourinary:  Negative for dysuria, frequency, hematuria and urgency.  Musculoskeletal: Positive for back pain, joint pain and myalgias. Negative for neck pain.  Skin: Negative for rash.  Neurological: Negative for dizziness, tingling, sensory change, focal weakness and headaches.  Endo/Heme/Allergies: Negative for environmental allergies and polydipsia. Does not bruise/bleed easily.  Psychiatric/Behavioral: Negative for depression and suicidal ideas. The patient is not nervous/anxious and does not have insomnia.      Objective  Vitals:   10/28/17 1344  BP: 138/80  Pulse: (!) 115  Temp: 99.5 F (37.5 C)  SpO2: 93%  Weight: 71 lb (32.2 kg)  Height: 5\' 4"  (1.626 m)    Physical Exam  Constitutional: She is well-developed, well-nourished, and in no distress. No distress.  HENT:  Head: Normocephalic and atraumatic.  Right Ear: External ear normal.  Left Ear: External ear normal.  Nose: Nose normal.  Mouth/Throat: Oropharynx is clear and moist. Mucous membranes are dry. No oropharyngeal exudate, posterior oropharyngeal edema or posterior oropharyngeal erythema.  Eyes: Conjunctivae and EOM are normal. Pupils are equal, round, and reactive to light. Right eye exhibits no discharge. Left eye exhibits no discharge.  Neck: Normal range of motion. Neck supple. No JVD present. No thyromegaly present.  Cardiovascular: Normal rate, regular rhythm, normal heart sounds  and intact distal pulses. Exam reveals no gallop and no friction rub.  No murmur heard. Pulmonary/Chest: Accessory muscle usage present. Tachypnea noted. She is in respiratory distress. She has decreased breath sounds. She has wheezes. She has no rales.  Abdominal: Soft. Bowel sounds are normal. She exhibits no mass. There is no tenderness. There is no guarding.  Musculoskeletal: Normal range of motion. She exhibits no edema.  Lymphadenopathy:    She has no cervical adenopathy.  Neurological: She is alert. She has normal reflexes.  Skin: Skin  is warm and dry. She is not diaphoretic.  Psychiatric: Mood and affect normal.  Nursing note and vitals reviewed.     Assessment & Plan  Problem List Items Addressed This Visit      Respiratory   COLD (chronic obstructive lung disease) ([place])     Endocrine   Diabetes mellitus without complication ([place])    Other Visit Diagnoses    Pneumonia due to infectious organism, unspecified laterality, unspecified part of lung    -  Primary   Dehydration          No orders of the defined types were placed in this encounter.     Dr. Mac[facility]  10/28/17

## 2017-10-28 NOTE — H&P (Signed)
[place] [facility] [place] NAME: Lindsey Nguyen    MR#:  [id number]  DATE OF BIRTH:  1950-11-23  DATE OF ADMISSION:  10/28/2017  PRIMARY CARE PHYSICIAN: Juline Patch, MD   REQUESTING/REFERRING PHYSICIAN: Dr. Charlotte Crumb  CHIEF COMPLAINT:   Chief Complaint  Patient presents with  . Shortness of Breath    HISTORY OF PRESENT ILLNESS:  Lindsey Nguyen  is a 67 y.o. female with a known history of breast cancer, COPD with ongoing tobacco abuse, diabetes, hypertension, osteoporosis who presents to the hospital due to shortness of breath and cough. Patient says she's been having worsening shortness of breath and cough for the past few days to a week. She went to see her primary care physician within referred her to the ER for further evaluation. Patient says her cough is productive with yellow sputum. She denies any fevers, chills or any nausea, vomiting. She denies any hemoptysis or any significant weight loss. Patient presents to the emergency room was noted to be in acute on chronic respiratory failure with hypoxia secondary to COPD exacerbation and also had chest x-ray findings suggestive of multifocal pneumonia. Hospitalist services were contacted further treatment and evaluation.  PAST MEDICAL HISTORY:   Past Medical History:  Diagnosis Date  . Cancer [facility])    right breast cancer with lumpectomy and rad tx  . COPD (chronic obstructive pulmonary disease) ([place])   . Diabetes mellitus without complication ([place])   . Hypertension   . Osteoporosis     PAST SURGICAL HISTORY:   Past Surgical History:  Procedure Laterality Date  . BREAST LUMPECTOMY     x 2  . BUNIONECTOMY Right   . COLONOSCOPY     normal  . PERIPHERAL VASCULAR CATHETERIZATION N/A 04/10/2016   Procedure: Lower Extremity Angiography;  Surgeon: Katha Cabal, MD;  Location: [place] [facility];  Service: Cardiovascular;  Laterality: N/A;  . PERIPHERAL VASCULAR CATHETERIZATION   04/10/2016   Procedure: Lower Extremity Intervention;  Surgeon: Katha Cabal, MD;  Location: [place] [facility];  Service: Cardiovascular;;  . TUBAL LIGATION      SOCIAL HISTORY:   Social History   Tobacco Use  . Smoking status: Current Every Day Smoker    Packs/day: 1.00    Years: 50.00    Pack years: 50.00    Types: Cigarettes  . Smokeless tobacco: Never Used  Substance Use Topics  . Alcohol use: Yes    Alcohol/week: 12.6 oz    Types: 14 Glasses of wine, 7 Cans of beer per week    FAMILY HISTORY:   Family History  Problem Relation Age of Onset  . Diabetes Father   . Kidney cancer Father   . Cancer Brother   . Diabetes Paternal Aunt   . Diabetes Paternal Uncle   . Kidney failure Mother     DRUG ALLERGIES:   Allergies  Allergen Reactions  . Tetanus Toxoids Swelling    REVIEW OF SYSTEMS:   Review of Systems  Constitutional: Negative for fever and weight loss.  HENT: Negative for congestion, nosebleeds and tinnitus.   Eyes: Negative for blurred vision, double vision and redness.  Respiratory: Positive for shortness of breath. Negative for cough and hemoptysis.   Cardiovascular: Negative for chest pain, orthopnea, leg swelling and PND.  Gastrointestinal: Negative for abdominal pain, diarrhea, melena, nausea and vomiting.  Genitourinary: Negative for dysuria, hematuria and urgency.  Musculoskeletal: Negative for falls and joint pain.  Neurological: Negative for  dizziness, tingling, sensory change, focal weakness, seizures, weakness and headaches.  Endo/Heme/Allergies: Negative for polydipsia. Does not bruise/bleed easily.  Psychiatric/Behavioral: Negative for depression and memory loss. The patient is not nervous/anxious.     MEDICATIONS AT HOME:   Prior to Admission medications   Medication Sig Start Date End Date Taking? Authorizing Provider  albuterol (PROVENTIL HFA;VENTOLIN HFA) 108 (90 Base) MCG/ACT inhaler Inhale 2 puffs into the lungs every 6  (six) hours as needed for wheezing or shortness of breath. 09/26/17   Juline Patch, MD  Aspirin Effervescent (ALKA-SELTZER PO) Take 1 tablet by mouth daily.    [provider]  fluticasone furoate-vilanterol (BREO ELLIPTA) 200-25 MCG/INH AEPB Inhale 1 puff into the lungs daily. 09/26/17   Juline Patch, MD  guaiFENesin-codeine (ROBITUSSIN AC) 100-10 MG/5ML syrup Take 5 mLs by mouth 3 (three) times daily as needed for cough. Patient not taking: Reported on 09/26/2017 07/23/17   Juline Patch, MD  lisinopril-hydrochlorothiazide (PRINZIDE,ZESTORETIC) 10-12.5 MG tablet Take 1 tablet by mouth daily. 09/26/17   Juline Patch, MD  loratadine (CLARITIN) 10 MG tablet TAKE 1 TABLET (10 MG TOTAL) BY MOUTH DAILY. 03/25/17   Juline Patch, MD  metFORMIN (GLUCOPHAGE) 500 MG tablet Take 1 tablet (500 mg total) by mouth daily. 09/26/17   Juline Patch, MD  nystatin-triamcinolone ointment [facility]) Apply 1 application topically 2 (two) times daily. 11/22/16   Juline Patch, MD  predniSONE (DELTASONE) 10 MG tablet Take 1 tablet (10 mg total) by mouth daily with breakfast. 4,4,4,3,3,3,2,2,2,1,1,1 Patient not taking: Reported on 09/26/2017 09/13/17   Juline Patch, MD  predniSONE (DELTASONE) 10 MG tablet TAKE 1 TABLET (10 MG TOTAL) BY MOUTH DAILY WITH BREAKFAST. 10/10/17   Juline Patch, MD  tiotropium (SPIRIVA HANDIHALER) 18 MCG inhalation capsule Place into inhaler and inhale. 08/05/17 08/05/18  [provider]      VITAL SIGNS:  Blood pressure 119/80, pulse (!) 110, temperature 99.1 F (37.3 C), temperature source Oral, resp. rate (!) 23, height 5\' 4"  (1.626 m), weight 32.2 kg (71 lb), SpO2 97 %.  PHYSICAL EXAMINATION:  Physical Exam  GENERAL:  67 y.o.-year-old patient lying in bed in mild respiratory distress.  EYES: Pupils equal, round, reactive to light and accommodation. No scleral icterus. Extraocular muscles intact.  HEENT: Head atraumatic, normocephalic. Oropharynx and  nasopharynx clear. No oropharyngeal erythema, moist oral mucosa  NECK:  Supple, no jugular venous distention. No thyroid enlargement, no tenderness.  LUNGS: Prolonged inspiratory and expiratory phase, no wheezing, rales, rhonchi. No use of accessory muscles of respiration.  CARDIOVASCULAR: S1, S2 RRR. No murmurs, rubs, gallops, clicks.  ABDOMEN: Soft, nontender, nondistended. Bowel sounds present. No organomegaly or mass.  EXTREMITIES: No pedal edema, cyanosis, or clubbing. + 2 pedal & radial pulses b/l.   NEUROLOGIC: Cranial nerves II through XII are intact. No focal Motor or sensory deficits appreciated b/l PSYCHIATRIC: The patient is alert and oriented x 3. SKIN: No obvious rash, lesion, or ulcer.   LABORATORY PANEL:   CBC Recent Labs  Lab 10/28/17 1539  WBC 12.9*  HGB 12.5  HCT 36.9  PLT 296   ------------------------------------------------------------------------------------------------------------------  Chemistries  No results for input(s): NA, K, CL, CO2, GLUCOSE, BUN, CREATININE, CALCIUM, MG, AST, ALT, ALKPHOS, BILITOT in the last 168 hours.  Invalid input(s): GFRCGP ------------------------------------------------------------------------------------------------------------------  Cardiac Enzymes No results for input(s): TROPONINI in the last 168 hours. ------------------------------------------------------------------------------------------------------------------  RADIOLOGY:  Dg Chest 2 View  Result Date: 10/28/2017 CLINICAL DATA:  Increased shortness  of breath and dry mouth for 1 week. RIGHT-sided breast cancer. EXAM: CHEST  2 VIEW COMPARISON:  02/28/2016. FINDINGS: Dense consolidation RIGHT middle lobe consistent with acute pneumonia. Moderate-sized RIGHT pleural effusion. Patchy airspace opacity on the LEFT laterally at the base could represent superimposed LEFT lower lobe infiltrate. Small LEFT effusion. Normal cardiomediastinal silhouette. Hyperinflation. No bony  abnormality. Worsening aeration from priors. IMPRESSION: Dense consolidation RIGHT middle lobe consistent with acute pneumonia and associated RIGHT pleural effusion. Patchy airspace opacity LEFT CP angle could represent small associated LEFT lower lobe infiltrate. Electronically Signed   By: Staci Righter M.D.   On: 10/28/2017 16:21     IMPRESSION AND PLAN:   67 year old female with past medical history of COPD with ongoing tobacco abuse, diabetes, COPD versus, hypertension, history of breast cancer presents to the hospital due to shortness of breath, cough and noted to be in acute respiratory failure with hypoxia.  1. Acute on chronic respiratory failure with hypoxia-secondary to COPD exacerbation.  -we'll treat the patient with IV steroids, scheduled DuoNeb's, Pulmicort nebs. Patient will need to be assessed for home oxygen prior to discharge.  2. COPD exacerbation-this is the cause of patient's worsening respiratory distress, this is secondary to multifocal pneumonia seen on the chest x-ray. -We'll treat the patient with IV steroids, scheduled DuoNeb's, Pulmicort nebs. Give patient IV ceftriaxone and Zithromax for the pneumonia. Follow clinically. Patient to be assessed for home oxygen prior to discharge.  3. Pneumonia - likely CAP.  - will treat with IV ceftriaxone, Zithromax, follow cultures.  4. Essential hypertension-continue lisinopril/HCTZ.  5. Diabetes type 2 without complication-continue metformin, I will add some sliding scale insulin as patient is going to be an IV steroids.    All the records are reviewed and case discussed with ED provider. Management plans discussed with the patient, family and they are in agreement.  CODE STATUS: Full code  TOTAL TIME TAKING CARE OF THIS PATIENT: 45 minutes.    Henreitta Leber M.D on 10/28/2017 at 7:14 PM  Between 7am to 6pm - Pager - [phone]  After 6pm go to [email or web address] - password [email or web address] [facility]   Office  [phone]  CC: Primary care physician; Juline Patch, MD

## 2017-10-29 LAB — CBC
HCT: 34.8 % — ABNORMAL LOW (ref 35.0–47.0)
Hemoglobin: 12.2 g/dL (ref 12.0–16.0)
MCH: 32.2 pg (ref 26.0–34.0)
MCHC: 35.2 g/dL (ref 32.0–36.0)
MCV: 91.4 fL (ref 80.0–100.0)
PLATELETS: 267 10*3/uL (ref 150–440)
RBC: 3.8 MIL/uL (ref 3.80–5.20)
RDW: 14.5 % (ref 11.5–14.5)
WBC: 10.3 10*3/uL (ref 3.6–11.0)

## 2017-10-29 LAB — BASIC METABOLIC PANEL
Anion gap: 14 (ref 5–15)
BUN: 25 mg/dL — ABNORMAL HIGH (ref 6–20)
CALCIUM: 8.2 mg/dL — AB (ref 8.9–10.3)
CHLORIDE: 82 mmol/L — AB (ref 101–111)
CO2: 23 mmol/L (ref 22–32)
CREATININE: 0.62 mg/dL (ref 0.44–1.00)
GFR calc non Af Amer: >60 mL/min (ref >60)
Glucose, Bld: 151 mg/dL — ABNORMAL HIGH (ref 65–99)
Potassium: 3 mmol/L — ABNORMAL LOW (ref 3.5–5.1)
Sodium: 119 mmol/L — CL (ref 135–145)

## 2017-10-29 LAB — GLUCOSE, CAPILLARY
GLUCOSE-CAPILLARY: 151 mg/dL — AB (ref 65–99)
GLUCOSE-CAPILLARY: 196 mg/dL — AB (ref 65–99)
Glucose-Capillary: 173 mg/dL — ABNORMAL HIGH (ref 65–99)
Glucose-Capillary: 171 mg/dL — ABNORMAL HIGH (ref 65–99)

## 2017-10-29 LAB — MAGNESIUM: Magnesium: 1.5 mg/dL — ABNORMAL LOW (ref 1.7–2.4)

## 2017-10-29 LAB — TROPONIN I
Troponin I: 0.04 ng/mL (ref <0.03)
Troponin I: 0.03 ng/mL (ref <0.03)
Troponin I: <0.03 ng/mL (ref <0.03)

## 2017-10-29 MED ORDER — OSELTAMIVIR PHOSPHATE 75 MG PO CAPS
75.0000 mg | ORAL_CAPSULE | Freq: Two times a day (BID) | ORAL | Status: DC
  Administered 2017-10-29 – 2017-10-31 (×5): 75 mg via ORAL
  Filled 2017-10-29 (×6): qty 1

## 2017-10-29 MED ORDER — POTASSIUM CHLORIDE 10 MEQ/100ML IV SOLN
10.0000 meq | INTRAVENOUS | Status: AC
  Administered 2017-10-29 (×4): 10 meq via INTRAVENOUS
  Filled 2017-10-29 (×5): qty 100

## 2017-10-29 MED ORDER — MAGNESIUM SULFATE 4 GM/100ML IV SOLN
4.0000 g | Freq: Once | INTRAVENOUS | Status: AC
  Administered 2017-10-29: 4 g via INTRAVENOUS
  Filled 2017-10-29: qty 100

## 2017-10-29 MED ORDER — NICOTINE 21 MG/24HR TD PT24
21.0000 mg | MEDICATED_PATCH | Freq: Every day | TRANSDERMAL | Status: DC
  Administered 2017-10-29 – 2017-10-31 (×3): 21 mg via TRANSDERMAL
  Filled 2017-10-29 (×3): qty 1

## 2017-10-29 NOTE — Consult Note (Signed)
MEDICATION RELATED CONSULT NOTE - INITIAL   Pharmacy Consult for electrolytes Indication: hypokalemia, hypomag  Allergies  Allergen Reactions  . Tetanus Toxoids Swelling    Patient Measurements: Height: 5\' 4"  (162.6 cm) Weight: 158 lb 4.6 oz (71.8 kg) IBW/kg (Calculated) : 54.7 Adjusted Body Weight:   Vital Signs: Temp: 97.7 F (36.5 C) (01/29 0914) Temp Source: Oral (01/29 0914) BP: 126/75 (01/29 0914) Pulse Rate: 105 (01/29 0914) Intake/Output from previous day: 01/28 0701 - 01/29 0700 In: 1408.3 [I.V.:408.3; IV Piggyback:1000] Out: 100 [Urine:100] Intake/Output from this shift: Total I/O In: 240 [P.O.:240] Out: 0   Labs: Recent Labs    10/28/17 1539 10/28/17 2112 10/29/17 0247 10/29/17 0923  WBC 12.9*  --  10.3  --   HGB 12.5  --  12.2  --   HCT 36.9  --  34.8*  --   PLT 296  --  267  --   CREATININE  --  0.63 0.62  --   MG  --   --   --  1.5*   Estimated Creatinine Clearance: 68.1 mL/min (by C-G formula based on SCr of 0.62 mg/dL).   Microbiology: Recent Results (from the past 720 hour(s))  Culture, blood (routine x 2)     Status: None (Preliminary result)   Collection Time: 10/28/17  6:42 PM  Result Value Ref Range Status   Specimen Description BLOOD BLOOD LEFT FOREARM  Final   Special Requests   Final    BOTTLES DRAWN AEROBIC AND ANAEROBIC Blood Culture adequate volume   Culture   Final    NO GROWTH < 12 HOURS Performed at [facility], [address], [place], [place] [address]    Report Status PENDING  Incomplete  Culture, blood (routine x 2)     Status: None (Preliminary result)   Collection Time: 10/28/17  6:47 PM  Result Value Ref Range Status   Specimen Description BLOOD LEFT ANTECUBITAL  Final   Special Requests   Final    BOTTLES DRAWN AEROBIC AND ANAEROBIC Blood Culture adequate volume   Culture   Final    NO GROWTH < 12 HOURS Performed at [facility], [address] [place], [place] [address]    Report Status  PENDING  Incomplete    Medical History: Past Medical History:  Diagnosis Date  . Cancer [facility])    right breast cancer with lumpectomy and rad tx  . COPD (chronic obstructive pulmonary disease) ([place])   . Diabetes mellitus without complication ([place])   . Hypertension   . Osteoporosis     Medications:  Scheduled:  . azithromycin  250 mg Oral Daily  . budesonide (PULMICORT) nebulizer solution  0.5 mg Nebulization BID  . enoxaparin (LOVENOX) injection  40 mg Subcutaneous Q24H  . insulin aspart  0-5 Units Subcutaneous QHS  . insulin aspart  0-9 Units Subcutaneous TID WC  . ipratropium-albuterol  3 mL Nebulization Q6H  . lisinopril  10 mg Oral Daily  . loratadine  10 mg Oral Daily  . metFORMIN  500 mg Oral Q breakfast  . methylPREDNISolone (SOLU-MEDROL) injection  40 mg Intravenous Q12H  . nicotine  21 mg Transdermal Daily  . oseltamivir  75 mg Oral BID    Assessment: Patient is a 67 year old female admitted for flu and PNA. Found to have a K of 3.0 and Mg of 1.5  Goal of Therapy:  Normalization of electrolytes  Plan:  KCL IV 10 MEQ x 5 runs Mg IV 4g  once Recheck in AM  [employer], Pharm.D, BCPS Clinical Pharmacist  10/29/2017,2:33 PM

## 2017-10-29 NOTE — Progress Notes (Signed)
[facility] [place] NAME: Breuna Loveall    MR#:  [id number]  DATE OF BIRTH:  19-Jul-1951  SUBJECTIVE:   Patient came in with increasing shortness of breath and cough.  She was found to have flu and right lower lobe pneumonia. Feels better. REVIEW OF SYSTEMS:   Review of Systems  Constitutional: Negative for chills, fever and weight loss.  HENT: Negative for ear discharge, ear pain and nosebleeds.   Eyes: Negative for blurred vision, pain and discharge.  Respiratory: Positive for cough and shortness of breath. Negative for sputum production, wheezing and stridor.   Cardiovascular: Negative for chest pain, palpitations, orthopnea and PND.  Gastrointestinal: Negative for abdominal pain, diarrhea, nausea and vomiting.  Genitourinary: Negative for frequency and urgency.  Musculoskeletal: Negative for back pain and joint pain.  Neurological: Positive for weakness. Negative for sensory change, speech change and focal weakness.  Psychiatric/Behavioral: Negative for depression and hallucinations. The patient is not nervous/anxious.    Tolerating Diet:yes Tolerating PT:   DRUG ALLERGIES:   Allergies  Allergen Reactions  . Tetanus Toxoids Swelling    VITALS:  Blood pressure 126/75, pulse (!) 105, temperature 97.7 F (36.5 C), temperature source Oral, resp. rate 18, height 5\' 4"  (1.626 m), weight 71.8 kg (158 lb 4.6 oz), SpO2 94 %.  PHYSICAL EXAMINATION:   Physical Exam  GENERAL:  67 y.o.-year-old patient lying in the bed with no acute distress.  Thin cachectic malnourished EYES: Pupils equal, round, reactive to light and accommodation. No scleral icterus. Extraocular muscles intact.  HEENT: Head atraumatic, normocephalic. Oropharynx and nasopharynx clear.  NECK:  Supple, no jugular venous distention. No thyroid enlargement, no tenderness.  LUNGS: emphysematous chest, breath sounds distant  bilaterally, no wheezing, rales, rhonchi. No  use of accessory muscles of respiration.  CARDIOVASCULAR: S1, S2 normal. No murmurs, rubs, or gallops.  ABDOMEN: Soft, nontender, nondistended. Bowel sounds present. No organomegaly or mass.  EXTREMITIES: No cyanosis, clubbing or edema b/l.    NEUROLOGIC: Cranial nerves II through XII are intact. No focal Motor or sensory deficits b/l.   PSYCHIATRIC:  patient is alert and oriented x 3.  SKIN: No obvious rash, lesion, or ulcer.   LABORATORY PANEL:  CBC Recent Labs  Lab 10/29/17 0247  WBC 10.3  HGB 12.2  HCT 34.8*  PLT 267    Chemistries  Recent Labs  Lab 10/29/17 0247 10/29/17 0923  NA 119*  --   K 3.0*  --   CL 82*  --   CO2 23  --   GLUCOSE 151*  --   BUN 25*  --   CREATININE 0.62  --   CALCIUM 8.2*  --   MG  --  1.5*   Cardiac Enzymes Recent Labs  Lab 10/29/17 0923  TROPONINI 0.03*   RADIOLOGY:  Dg Chest 2 View  Result Date: 10/28/2017 CLINICAL DATA:  Increased shortness of breath and dry mouth for 1 week. RIGHT-sided breast cancer. EXAM: CHEST  2 VIEW COMPARISON:  02/28/2016. FINDINGS: Dense consolidation RIGHT middle lobe consistent with acute pneumonia. Moderate-sized RIGHT pleural effusion. Patchy airspace opacity on the LEFT laterally at the base could represent superimposed LEFT lower lobe infiltrate. Small LEFT effusion. Normal cardiomediastinal silhouette. Hyperinflation. No bony abnormality. Worsening aeration from priors. IMPRESSION: Dense consolidation RIGHT middle lobe consistent with acute pneumonia and associated RIGHT pleural effusion. Patchy airspace opacity LEFT CP angle could represent small associated LEFT lower lobe infiltrate. Electronically Signed   By: Jenny Reichmann  Alfonse Flavors M.D.   On: 10/28/2017 16:21   ASSESSMENT AND PLAN:  67 year old female with past medical history of COPD with ongoing tobacco abuse, diabetes, COPD versus, hypertension, history of breast cancer presents to the hospital due to shortness of breath, cough and noted to be in acute  respiratory failure with hypoxia.  1. Acute on chronic respiratory failure with hypoxia-secondary to COPD exacerbation.  -cotn IV steroids, scheduled DuoNeb's, Pulmicort nebs. Patient will need to be assessed for home oxygen prior to discharge.  2. COPD exacerbation-this is the cause of patient's worsening respiratory distress, this is secondary to multifocal pneumonia seen on the chest x-ray. -cont  IV steroids, scheduled DuoNeb's, Pulmicort nebs.  - IV ceftriaxone and Zithromax for the pneumonia.  - Patient to be assessed for home oxygen prior to discharge.  3. Pneumonia - likely CAP.  - will treat with IV ceftriaxone, Zithromax, follow cultures.  4. Essential hypertension-continue lisinopril/HCTZ.  5. Diabetes type 2 without complication-continue metformin, I will add some sliding scale insulin as patient is going to be an IV steroids.  6.  Ongoing tobacco abuse heavy.  Patient counseled more than 4 minutes for smoking cessation.  Nicotine patch provided.  Case discussed with Care Management/Social Worker. Management plans discussed with the patient, family and they are in agreement.  CODE STATUS: full  DVT Prophylaxis: lovenox  TOTAL TIME TAKING CARE OF THIS PATIENT: *30* minutes.  >50% time spent on counselling and coordination of care  POSSIBLE D/C IN 1-2 DAYS, DEPENDING ON CLINICAL CONDITION.  Note: This dictation was prepared with Dragon dictation along with smaller phrase technology. Any transcriptional errors that result from this process are unintentional.  Fritzi Mandes M.D on 10/29/2017 at 12:21 PM  Between 7am to 6pm - Pager - [phone]  After 6pm go to [email or web address] - password [email or web address] [facility]  Office  [phone]  CC: Primary care physician; Juline Patch, MDPatient ID: Nathen May, female   DOB: December 29, 1950, 67 y.o.   MRN: [id number]

## 2017-10-29 NOTE — Progress Notes (Signed)
Notified MD of Na of 119 and K+ of 3.0. No new orders at this time. Will continue to monitor and assess.

## 2017-10-30 LAB — HIV ANTIBODY (ROUTINE TESTING W REFLEX): HIV SCREEN 4TH GENERATION: NONREACTIVE

## 2017-10-30 LAB — GLUCOSE, CAPILLARY
Glucose-Capillary: 161 mg/dL — ABNORMAL HIGH (ref 65–99)
Glucose-Capillary: 161 mg/dL — ABNORMAL HIGH (ref 65–99)
Glucose-Capillary: 189 mg/dL — ABNORMAL HIGH (ref 65–99)
Glucose-Capillary: 97 mg/dL (ref 65–99)

## 2017-10-30 LAB — POTASSIUM: POTASSIUM: 3.2 mmol/L — AB (ref 3.5–5.1)

## 2017-10-30 LAB — MAGNESIUM: Magnesium: 2.1 mg/dL (ref 1.7–2.4)

## 2017-10-30 LAB — SODIUM: Sodium: 121 mmol/L — ABNORMAL LOW (ref 135–145)

## 2017-10-30 MED ORDER — ALBUTEROL SULFATE (2.5 MG/3ML) 0.083% IN NEBU: 3.0000 mL | INHALATION_SOLUTION | Freq: Four times a day (QID) | RESPIRATORY_TRACT | Status: DC | PRN

## 2017-10-30 MED ORDER — POTASSIUM CHLORIDE CRYS ER 20 MEQ PO TBCR
40.0000 meq | EXTENDED_RELEASE_TABLET | Freq: Once | ORAL | Status: AC
  Administered 2017-10-30: 40 meq via ORAL
  Filled 2017-10-30: qty 2

## 2017-10-30 MED ORDER — FLUTICASONE FUROATE-VILANTEROL 200-25 MCG/INH IN AEPB
1.0000 | INHALATION_SPRAY | Freq: Every day | RESPIRATORY_TRACT | Status: DC
  Administered 2017-10-30 – 2017-10-31 (×2): 1 via RESPIRATORY_TRACT
  Filled 2017-10-30: qty 28

## 2017-10-30 MED ORDER — SODIUM CHLORIDE 0.9 % IV SOLN
INTRAVENOUS | Status: DC
  Administered 2017-10-30: 13:00:00 via INTRAVENOUS
  Administered 2017-10-31: 75 mL/h via INTRAVENOUS

## 2017-10-30 MED ORDER — TIOTROPIUM BROMIDE MONOHYDRATE 18 MCG IN CAPS
18.0000 ug | ORAL_CAPSULE | Freq: Every day | RESPIRATORY_TRACT | Status: DC
  Administered 2017-10-30 – 2017-10-31 (×2): 18 ug via RESPIRATORY_TRACT
  Filled 2017-10-30: qty 5

## 2017-10-30 NOTE — Plan of Care (Signed)
  Progressing Education: Knowledge of General Education information will improve 10/30/2017 1803 - Progressing by Rolley Sims, RN Health Behavior/Discharge Planning: Ability to manage health-related needs will improve 10/30/2017 1803 - Progressing by Rolley Sims, RN Safety: Ability to remain free from injury will improve 10/30/2017 1803 - Progressing by Rolley Sims, RN

## 2017-10-30 NOTE — Progress Notes (Signed)
[facility] [place] NAME: Lindsey Nguyen    MR#:  [id number]  DATE OF BIRTH:  06-Sep-1951  SUBJECTIVE:   Patient came in with increasing shortness of breath and cough.  She was found to have flu and right lower lobe pneumonia. Feels better. REVIEW OF SYSTEMS:   Review of Systems  Constitutional: Negative for chills, fever and weight loss.  HENT: Negative for ear discharge, ear pain and nosebleeds.   Eyes: Negative for blurred vision, pain and discharge.  Respiratory: Positive for cough and shortness of breath. Negative for sputum production, wheezing and stridor.   Cardiovascular: Negative for chest pain, palpitations, orthopnea and PND.  Gastrointestinal: Negative for abdominal pain, diarrhea, nausea and vomiting.  Genitourinary: Negative for frequency and urgency.  Musculoskeletal: Negative for back pain and joint pain.  Neurological: Positive for weakness. Negative for sensory change, speech change and focal weakness.  Psychiatric/Behavioral: Negative for depression and hallucinations. The patient is not nervous/anxious.    Tolerating Diet:yes Tolerating PT:   DRUG ALLERGIES:   Allergies  Allergen Reactions  . Tetanus Toxoids Swelling    VITALS:  Blood pressure (!) 147/88, pulse 85, temperature 97.6 F (36.4 C), temperature source Oral, resp. rate 18, height 5\' 4"  (1.626 m), weight 71.8 kg (158 lb 4.6 oz), SpO2 96 %.  PHYSICAL EXAMINATION:   Physical Exam  GENERAL:  67 y.o.-year-old patient lying in the bed with no acute distress.  Thin cachectic malnourished EYES: Pupils equal, round, reactive to light and accommodation. No scleral icterus. Extraocular muscles intact.  HEENT: Head atraumatic, normocephalic. Oropharynx and nasopharynx clear.  NECK:  Supple, no jugular venous distention. No thyroid enlargement, no tenderness.  LUNGS: emphysematous chest, breath sounds distant  bilaterally, no wheezing, rales, rhonchi. No  use of accessory muscles of respiration.  CARDIOVASCULAR: S1, S2 normal. No murmurs, rubs, or gallops.  ABDOMEN: Soft, nontender, nondistended. Bowel sounds present. No organomegaly or mass.  EXTREMITIES: No cyanosis, clubbing or edema b/l.    NEUROLOGIC: Cranial nerves II through XII are intact. No focal Motor or sensory deficits b/l.   PSYCHIATRIC:  patient is alert and oriented x 3.  SKIN: No obvious rash, lesion, or ulcer.   LABORATORY PANEL:  CBC Recent Labs  Lab 10/29/17 0247  WBC 10.3  HGB 12.2  HCT 34.8*  PLT 267    Chemistries  Recent Labs  Lab 10/29/17 0247  10/30/17 0315 10/30/17 0850  NA 119*  --   --  121*  K 3.0*  --  3.2*  --   CL 82*  --   --   --   CO2 23  --   --   --   GLUCOSE 151*  --   --   --   BUN 25*  --   --   --   CREATININE 0.62  --   --   --   CALCIUM 8.2*  --   --   --   MG  --    < > 2.1  --    < > = values in this interval not displayed.   Cardiac Enzymes Recent Labs  Lab 10/29/17 1455  TROPONINI <0.03   RADIOLOGY:  No results found. ASSESSMENT AND PLAN:  67 year old female with past medical history of COPD with ongoing tobacco abuse, diabetes, COPD versus, hypertension, history of breast cancer presents to the hospital due to shortness of breath, cough and noted to be in acute respiratory failure with hypoxia.  1. Acute on chronic respiratory failure with hypoxia-secondary to COPD exacerbation.  -cotn IV steroids, scheduled DuoNeb's, Pulmicort nebs. Patient will need to be assessed for home oxygen prior to discharge.  2. COPD exacerbation-this is the cause of patient's worsening respiratory distress, this is secondary to multifocal pneumonia seen on the chest x-ray. -cont  IV steroids, scheduled DuoNeb's, Pulmicort nebs.  - IV ceftriaxone and Zithromax for the pneumonia.  - Patient to be assessed for home oxygen prior to discharge.  3.   Hyponatremia hypokalemia suspected SIADH in the setting of COPD and pneumonia Continue IV  fluids Came in with sodium of 119--- 121 -We will check CT chest to rule out malignancy given long-standing history of tobacco abuse  4. Essential hypertension-continue lisinopril/HCTZ.  5. Diabetes type 2 without complication-continue metformin, I will add some sliding scale insulin as patient is going to be an IV steroids.  6.  Ongoing tobacco abuse heavy.  Patient counseled more than 4 minutes for smoking cessation.  Nicotine patch provided.  Case discussed with Care Management/Social Worker. Management plans discussed with the patient, family and they are in agreement.  CODE STATUS: full  DVT Prophylaxis: lovenox  TOTAL TIME TAKING CARE OF THIS PATIENT: *30* minutes.  >50% time spent on counselling and coordination of care  POSSIBLE D/C IN 1-2 DAYS, DEPENDING ON CLINICAL CONDITION.  Note: This dictation was prepared with Dragon dictation along with smaller phrase technology. Any transcriptional errors that result from this process are unintentional.  Fritzi Mandes M.D on 10/30/2017 at 8:25 PM  Between 7am to 6pm - Pager - [phone]  After 6pm go to [email or web address] - password [email or web address] [facility]  Office  [phone]  CC: Primary care physician; Juline Patch, MDPatient ID: Lindsey Nguyen, female   DOB: Mar 24, 1951, 67 y.o.   MRN: [id number]

## 2017-10-30 NOTE — Consult Note (Signed)
MEDICATION RELATED CONSULT NOTE - INITIAL   Pharmacy Consult for electrolytes Indication: hypokalemia, hypomag  Allergies  Allergen Reactions  . Tetanus Toxoids Swelling    Patient Measurements: Height: 5\' 4"  (162.6 cm) Weight: 158 lb 4.6 oz (71.8 kg) IBW/kg (Calculated) : 54.7 Adjusted Body Weight:   Vital Signs: Temp: 97.6 F (36.4 C) (01/30 0729) Temp Source: Oral (01/30 0729) BP: 151/85 (01/30 0729) Pulse Rate: 93 (01/30 0729) Intake/Output from previous day: 01/29 0701 - 01/30 0700 In: 290 [P.O.:240; IV Piggyback:50] Out: 0  Intake/Output from this shift: No intake/output data recorded.  Labs: Recent Labs    10/28/17 1539 10/28/17 2112 10/29/17 0247 10/29/17 0923 10/30/17 0315  WBC 12.9*  --  10.3  --   --   HGB 12.5  --  12.2  --   --   HCT 36.9  --  34.8*  --   --   PLT 296  --  267  --   --   CREATININE  --  0.63 0.62  --   --   MG  --   --   --  1.5* 2.1   Estimated Creatinine Clearance: 68.1 mL/min (by C-G formula based on SCr of 0.62 mg/dL).   Microbiology: Recent Results (from the past 720 hour(s))  Culture, blood (routine x 2)     Status: None (Preliminary result)   Collection Time: 10/28/17  6:42 PM  Result Value Ref Range Status   Specimen Description BLOOD BLOOD LEFT FOREARM  Final   Special Requests   Final    BOTTLES DRAWN AEROBIC AND ANAEROBIC Blood Culture adequate volume   Culture   Final    NO GROWTH 2 DAYS Performed at [facility], [address] [place], [place] [address]    Report Status PENDING  Incomplete  Culture, blood (routine x 2)     Status: None (Preliminary result)   Collection Time: 10/28/17  6:47 PM  Result Value Ref Range Status   Specimen Description BLOOD LEFT ANTECUBITAL  Final   Special Requests   Final    BOTTLES DRAWN AEROBIC AND ANAEROBIC Blood Culture adequate volume   Culture   Final    NO GROWTH 2 DAYS Performed at [facility], [address] [place], [place] [address]    Report Status PENDING  Incomplete    Medical History: Past Medical History:  Diagnosis Date  . Cancer [facility])    right breast cancer with lumpectomy and rad tx  . COPD (chronic obstructive pulmonary disease) ([place])   . Diabetes mellitus without complication ([place])   . Hypertension   . Osteoporosis     Medications:  Scheduled:  . azithromycin  250 mg Oral Daily  . budesonide (PULMICORT) nebulizer solution  0.5 mg Nebulization BID  . enoxaparin (LOVENOX) injection  40 mg Subcutaneous Q24H  . insulin aspart  0-5 Units Subcutaneous QHS  . insulin aspart  0-9 Units Subcutaneous TID WC  . ipratropium-albuterol  3 mL Nebulization Q6H  . lisinopril  10 mg Oral Daily  . loratadine  10 mg Oral Daily  . metFORMIN  500 mg Oral Q breakfast  . methylPREDNISolone (SOLU-MEDROL) injection  40 mg Intravenous Q12H  . nicotine  21 mg Transdermal Daily  . oseltamivir  75 mg Oral BID  . potassium chloride  40 mEq Oral Once    Assessment: Patient is a 67 year old female admitted for flu and PNA. Found to have a K of 3.0 and Mg of 1.5  Goal of Therapy:  Normalization of electrolytes  Plan:  K=3.2 Mg=2.1 KCL 40 MEQ po once Recheck K in the AM  [employer], Pharm.D, BCPS Clinical Pharmacist  10/30/2017,8:12 AM

## 2017-10-30 NOTE — Progress Notes (Signed)
Patient to be transferred to 1A Room 146. Report called to receiving RN and all questions answered. Patient updated on room transfer. Earleen Reaper, RN

## 2017-10-31 ENCOUNTER — Inpatient Hospital Stay: Payer: 59

## 2017-10-31 LAB — BASIC METABOLIC PANEL
ANION GAP: 11 (ref 5–15)
BUN: 19 mg/dL (ref 6–20)
CALCIUM: 8.4 mg/dL — AB (ref 8.9–10.3)
CO2: 24 mmol/L (ref 22–32)
Chloride: 92 mmol/L — ABNORMAL LOW (ref 101–111)
Creatinine, Ser: 0.47 mg/dL (ref 0.44–1.00)
Glucose, Bld: 153 mg/dL — ABNORMAL HIGH (ref 65–99)
POTASSIUM: 3.3 mmol/L — AB (ref 3.5–5.1)
Sodium: 127 mmol/L — ABNORMAL LOW (ref 135–145)

## 2017-10-31 LAB — GLUCOSE, CAPILLARY
Glucose-Capillary: 129 mg/dL — ABNORMAL HIGH (ref 65–99)
Glucose-Capillary: 147 mg/dL — ABNORMAL HIGH (ref 65–99)

## 2017-10-31 MED ORDER — LISINOPRIL 10 MG PO TABS: 10.0000 mg | ORAL_TABLET | Freq: Every day | ORAL | 1 refills | Status: DC

## 2017-10-31 MED ORDER — OSELTAMIVIR PHOSPHATE 75 MG PO CAPS: 75.0000 mg | ORAL_CAPSULE | Freq: Two times a day (BID) | ORAL | 0 refills | Status: AC

## 2017-10-31 MED ORDER — LEVOFLOXACIN 750 MG PO TABS: 750.0000 mg | ORAL_TABLET | Freq: Every day | ORAL | 0 refills | Status: AC

## 2017-10-31 MED ORDER — PREDNISONE 10 MG PO TABS: ORAL_TABLET | ORAL | 0 refills | Status: DC

## 2017-10-31 MED ORDER — POTASSIUM CHLORIDE IN NACL 20-0.9 MEQ/L-% IV SOLN
INTRAVENOUS | Status: DC
  Administered 2017-10-31: 08:00:00 via INTRAVENOUS
  Filled 2017-10-31 (×2): qty 1000

## 2017-10-31 MED ORDER — POTASSIUM CHLORIDE CRYS ER 20 MEQ PO TBCR
40.0000 meq | EXTENDED_RELEASE_TABLET | Freq: Once | ORAL | Status: AC
  Administered 2017-10-31: 40 meq via ORAL
  Filled 2017-10-31: qty 2

## 2017-10-31 NOTE — Discharge Summary (Signed)
[facility] NAME: Lindsey Nguyen    MR#:  [id number]  DATE OF BIRTH:  1951/01/10  DATE OF ADMISSION:  10/28/2017 ADMITTING PHYSICIAN: Henreitta Leber, MD  DATE OF DISCHARGE: 10/31/2017  PRIMARY CARE PHYSICIAN: Juline Patch, MD    ADMISSION DIAGNOSIS:  Community acquired pneumonia, unspecified laterality [J18.9]  DISCHARGE DIAGNOSIS:  Active Problems:   Acute on chronic respiratory failure with hypoxia ([place])   SECONDARY DIAGNOSIS:   Past Medical History:  Diagnosis Date  . Cancer [facility])    right breast cancer with lumpectomy and rad tx  . COPD (chronic obstructive pulmonary disease) ([place])   . Diabetes mellitus without complication ([place])   . Hypertension   . Osteoporosis     HOSPITAL COURSE:   67 year old female with past medical history of COPD with ongoing tobacco abuse, diabetes, COPD versus, hypertension, history of breast cancer presents to the hospital due to shortness of breath, cough and noted to be in acute respiratory failure with hypoxia.  1. Acute on chronic respiratory failure with hypoxia-secondary to COPD exacerbation.  - pt. Improved IV steroids, scheduled DuoNeb's, Pulmicort nebs and now being discharged on Oral pred. Taper and empiric Levaquin.  - pt. Did not want O2 at home.    2. COPD exacerbation-this is the cause of patient's worsening respiratory distress, this is secondary to multifocal pneumonia and also the Flu as pt's PCR was positive for Influenza B.  - improved with IV steroids, scheduled DuoNeb's, Pulmicort nebs and also IV ceftriaxone and Zithromax for the pneumonia, along with Tamiflu for Flu.  - now being discharged Oral Pred taper, Tamiflu and also Levaquin.  She refused O2 at home.   - pt. Will resume her Bre-Ellipta, Spiriva.   3.  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 Was on HCTZ.     - improved w/ IV fluids.  Pt. Was underwent a CT scan of the chest which  showed no lung mass. Patient's sodium is improved to 128. She is clinically asymptomatic. She'll be discharged home today.  4. Essential hypertension-pt. Has been taken off HCTZ and will take only Lisinopril now given her Hyponatremia.   5. Diabetes type 2 without complication- pt. Will resume Metformin upon discharge.  Pt. Was on sSI while in the hospital.    DISCHARGE CONDITIONS:   Stable.   CONSULTS OBTAINED:    DRUG ALLERGIES:   Allergies  Allergen Reactions  . Tetanus Toxoids Swelling    DISCHARGE MEDICATIONS:   Allergies as of 10/31/2017      Reactions   Tetanus Toxoids Swelling      Medication List    STOP taking these medications   lisinopril-hydrochlorothiazide 10-12.5 MG tablet Commonly known as:  PRINZIDE,ZESTORETIC   loratadine 10 MG tablet Commonly known as:  CLARITIN   nystatin-triamcinolone ointment Commonly known as:  MYCOLOG     TAKE these medications   albuterol 108 (90 Base) MCG/ACT inhaler Commonly known as:  PROVENTIL HFA;VENTOLIN HFA Inhale 2 puffs into the lungs every 6 (six) hours as needed for wheezing or shortness of breath.   fluticasone furoate-vilanterol 200-25 MCG/INH Aepb Commonly known as:  BREO ELLIPTA Inhale 1 puff into the lungs daily.   guaiFENesin-codeine 100-10 MG/5ML syrup Commonly known as:  ROBITUSSIN AC Take 5 mLs by mouth 3 (three) times daily as needed for cough.   levofloxacin 750 MG tablet Commonly known as:  LEVAQUIN Take 1 tablet (750 mg total) by  mouth daily for 5 days.   lisinopril 10 MG tablet Commonly known as:  PRINIVIL,ZESTRIL Take 1 tablet (10 mg total) by mouth daily. Start taking on:  11/01/2017   metFORMIN 500 MG tablet Commonly known as:  GLUCOPHAGE Take 1 tablet (500 mg total) by mouth daily.   oseltamivir 75 MG capsule Commonly known as:  TAMIFLU Take 1 capsule (75 mg total) by mouth 2 (two) times daily for 3 days.   predniSONE 10 MG tablet Commonly known as:  DELTASONE Label  &  dispense according to the schedule below. 5 Pills PO for 1 day then, 4 Pills PO for 1 day, 3 Pills PO for 1 day, 2 Pills PO for 1 day, 1 Pill PO for 1 days then STOP. What changed:    how much to take  how to take this  when to take this  additional instructions  Another medication with the same name was removed. Continue taking this medication, and follow the directions you see here.   SPIRIVA HANDIHALER 18 MCG inhalation capsule Generic drug:  tiotropium Place into inhaler and inhale.         DISCHARGE INSTRUCTIONS:   DIET:  Cardiac diet and Diabetic diet  DISCHARGE CONDITION:  Stable  ACTIVITY:  Activity as tolerated  OXYGEN:  Home Oxygen: No.   Oxygen Delivery: room air  DISCHARGE LOCATION:  home   If you experience worsening of your admission symptoms, develop shortness of breath, life threatening emergency, suicidal or homicidal thoughts you must seek medical attention immediately by calling 911 or calling your MD immediately  if symptoms less severe.  You Must read complete instructions/literature along with all the possible adverse reactions/side effects for all the Medicines you take and that have been prescribed to you. Take any new Medicines after you have completely understood and accpet all the possible adverse reactions/side effects.   Please note  You were cared for by a hospitalist during your hospital stay. If you have any questions about your discharge medications or the care you received while you were in the hospital after you are discharged, you can call the unit and asked to speak with the hospitalist on call if the hospitalist that took care of you is not available. Once you are discharged, your primary care physician will handle any further medical issues. Please note that NO REFILLS for any discharge medications will be authorized once you are discharged, as it is imperative that you return to your primary care physician (or establish a  relationship with a primary care physician if you do not have one) for your aftercare needs so that they can reassess your need for medications and monitor your lab values.     Today   Pt. Feels better and shortness of breath improved.  No other complaints presently and wants to go home.   VITAL SIGNS:  Blood pressure (!) 143/87, pulse 80, temperature 98.3 F (36.8 C), temperature source Oral, resp. rate 18, height 5\' 4"  (1.626 m), weight 71.8 kg (158 lb 4.6 oz), SpO2 96 %.  I/O:    Intake/Output Summary (Last 24 hours) at 10/31/2017 1423 Last data filed at 10/31/2017 0300 Gross per 24 hour  Intake 1257.5 ml  Output 0 ml  Net 1257.5 ml    PHYSICAL EXAMINATION:    GENERAL:  67 y.o.-year-old thin cachectic patient lying in the bed with no acute distress.  EYES: Pupils equal, round, reactive to light and accommodation. No scleral icterus. Extraocular muscles intact.  HEENT: Head  atraumatic, normocephalic. Oropharynx and nasopharynx clear.  NECK:  Supple, no jugular venous distention. No thyroid enlargement, no tenderness.  LUNGS: Good A/E b/l,  no wheezing, rales, rhonchi. No use of accessory muscles of respiration.  CARDIOVASCULAR: S1, S2 normal. No murmurs, rubs, or gallops.  ABDOMEN: Soft, nontender, nondistended. Bowel sounds present. No organomegaly or mass.  EXTREMITIES: No cyanosis, clubbing or edema b/l.    NEUROLOGIC: Cranial nerves II through XII are intact. No focal Motor or sensory deficits b/l.   PSYCHIATRIC:  patient is alert and oriented x 3.  SKIN: No obvious rash, lesion, or ulcer.      DATA REVIEW:   CBC Recent Labs  Lab 10/29/17 0247  WBC 10.3  HGB 12.2  HCT 34.8*  PLT 267    Chemistries  Recent Labs  Lab 10/30/17 0315  10/31/17 0343  NA  --    < > 127*  K 3.2*  --  3.3*  CL  --   --  92*  CO2  --   --  24  GLUCOSE  --   --  153*  BUN  --   --  19  CREATININE  --   --  0.47  CALCIUM  --   --  8.4*  MG 2.1  --   --    < > = values in  this interval not displayed.    Cardiac Enzymes Recent Labs  Lab 10/29/17 1455  TROPONINI <0.03    Microbiology Results  Results for orders placed or performed during the hospital encounter of 10/28/17  Culture, blood (routine x 2)     Status: None (Preliminary result)   Collection Time: 10/28/17  6:42 PM  Result Value Ref Range Status   Specimen Description BLOOD BLOOD LEFT FOREARM  Final   Special Requests   Final    BOTTLES DRAWN AEROBIC AND ANAEROBIC Blood Culture adequate volume   Culture   Final    NO GROWTH 3 DAYS Performed at [facility], [address], [place], [place] [address]    Report Status PENDING  Incomplete  Culture, blood (routine x 2)     Status: None (Preliminary result)   Collection Time: 10/28/17  6:47 PM  Result Value Ref Range Status   Specimen Description BLOOD LEFT ANTECUBITAL  Final   Special Requests   Final    BOTTLES DRAWN AEROBIC AND ANAEROBIC Blood Culture adequate volume   Culture   Final    NO GROWTH 3 DAYS Performed at [facility], [address] [place], [place] [address]    Report Status PENDING  Incomplete    RADIOLOGY:  Ct Chest Wo Contrast  Result Date: 10/31/2017 CLINICAL DATA:  Patient came in with increasing shortness of breath and cough. She was found to have flu and right lower lobe pneumonia. EXAM: CT CHEST WITHOUT CONTRAST TECHNIQUE: Multidetector CT imaging of the chest was performed following the standard protocol without IV contrast. COMPARISON:  10/28/2017 FINDINGS: Cardiovascular: Heart size is normal. There is significant atherosclerotic calcification of the coronary arteries. Trace pericardial effusion. Noncontrast appearance of the pulmonary arteries is normal. There is moderate atherosclerotic calcification of the thoracic aorta not associated with aneurysm. Mediastinum/Nodes: The visualized portion of the thyroid gland has a normal appearance. No mediastinal, hilar, or axillary adenopathy.  Esophagus is normal. Lungs/Pleura: There are diffuse, severe emphysematous changes. Bronchial wall thickening is present. Within the right lower lobe, there are reticular changes and confluent consolidation at the periphery with similar changes  also involving the right middle lobe. Focal area of consolidation is also identified within lingula. No pleural effusions. No changes of pulmonary edema. No suspicious pulmonary nodules. There has been resolution of the linear opacity in the right upper lobe. Upper Abdomen: No acute abnormality. Musculoskeletal: No chest wall mass or suspicious bone lesions identified. IMPRESSION: 1. Severe emphysema.  Emphysema (ICD10-J43.9). 2. Confluent consolidation and reticular opacities within the right lower lobe and right middle lobe and to lesser degree in the lingula. Findings favor multifocal infectious process or inflammation. However, follow-up is recommended to document clearing. Follow-up chest CT is recommended in 3 months. 3.  Aortic Atherosclerosis (ICD10-I70.0). Electronically Signed   By: Nolon Nations M.D.   On: 10/31/2017 09:50      Management plans discussed with the patient, family and they are in agreement.  CODE STATUS:     Code Status Orders  (From admission, onward)        Start     Ordered   10/28/17 2052  Full code  Continuous     10/28/17 2052    Code Status History    Date Active Date Inactive Code Status Order ID Comments User Context   04/10/2016 12:17 04/10/2016 19:35 Full Code [id number]  Schnier, Dolores Lory, MD Inpatient      TOTAL TIME TAKING CARE OF THIS PATIENT: 40 minutes.    Henreitta Leber M.D on 10/31/2017 at 2:23 PM  Between 7am to 6pm - Pager - [phone]  After 6pm go to [email or web address] - Proofreader  [facility]  Office  [phone]  CC: Primary care physician; Juline Patch, MD

## 2017-10-31 NOTE — Consult Note (Signed)
MEDICATION RELATED CONSULT NOTE - INITIAL   Pharmacy Consult for electrolytes Indication: hypokalemia, hypomag  Allergies  Allergen Reactions  . Tetanus Toxoids Swelling    Patient Measurements: Height: 5\' 4"  (162.6 cm) Weight: 158 lb 4.6 oz (71.8 kg) IBW/kg (Calculated) : 54.7 Adjusted Body Weight:   Vital Signs: Temp: 97.7 F (36.5 C) (01/31 0356) Temp Source: Oral (01/31 0356) BP: 140/80 (01/31 0356) Pulse Rate: 80 (01/31 0356) Intake/Output from previous day: 01/30 0701 - 01/31 0700 In: 1257.5 [P.O.:120; I.V.:1087.5; IV Piggyback:50] Out: 0  Intake/Output from this shift: No intake/output data recorded.  Labs: Recent Labs    10/28/17 1539 10/28/17 2112 10/29/17 0247 10/29/17 0923 10/30/17 0315 10/31/17 0343  WBC 12.9*  --  10.3  --   --   --   HGB 12.5  --  12.2  --   --   --   HCT 36.9  --  34.8*  --   --   --   PLT 296  --  267  --   --   --   CREATININE  --  0.63 0.62  --   --  0.47  MG  --   --   --  1.5* 2.1  --    Estimated Creatinine Clearance: 68.1 mL/min (by C-G formula based on SCr of 0.47 mg/dL).   Microbiology: Recent Results (from the past 720 hour(s))  Culture, blood (routine x 2)     Status: None (Preliminary result)   Collection Time: 10/28/17  6:42 PM  Result Value Ref Range Status   Specimen Description BLOOD BLOOD LEFT FOREARM  Final   Special Requests   Final    BOTTLES DRAWN AEROBIC AND ANAEROBIC Blood Culture adequate volume   Culture   Final    NO GROWTH 2 DAYS Performed at [facility], [address], [place], [place] [address]    Report Status PENDING  Incomplete  Culture, blood (routine x 2)     Status: None (Preliminary result)   Collection Time: 10/28/17  6:47 PM  Result Value Ref Range Status   Specimen Description BLOOD LEFT ANTECUBITAL  Final   Special Requests   Final    BOTTLES DRAWN AEROBIC AND ANAEROBIC Blood Culture adequate volume   Culture   Final    NO GROWTH 2 DAYS Performed at [facility], [address], [place], [place] [address]    Report Status PENDING  Incomplete    Medical History: Past Medical History:  Diagnosis Date  . Cancer [facility])    right breast cancer with lumpectomy and rad tx  . COPD (chronic obstructive pulmonary disease) ([place])   . Diabetes mellitus without complication ([place])   . Hypertension   . Osteoporosis     Medications:  Scheduled:  . azithromycin  250 mg Oral Daily  . enoxaparin (LOVENOX) injection  40 mg Subcutaneous Q24H  . fluticasone furoate-vilanterol  1 puff Inhalation Daily  . insulin aspart  0-5 Units Subcutaneous QHS  . insulin aspart  0-9 Units Subcutaneous TID WC  . lisinopril  10 mg Oral Daily  . loratadine  10 mg Oral Daily  . metFORMIN  500 mg Oral Q breakfast  . methylPREDNISolone (SOLU-MEDROL) injection  40 mg Intravenous Q12H  . nicotine  21 mg Transdermal Daily  . oseltamivir  75 mg Oral BID  . potassium chloride  40 mEq Oral Once  . tiotropium  18 mcg Inhalation Daily    Assessment: Patient is a 67 year old  female admitted for flu and PNA. Found to have a K of 3.0 and Mg of 1.5  Goal of Therapy:  Normalization of electrolytes  Plan:  K is up for 3.2 to 3.3 after 40 MEQ po of KCL yesterday. Per RN pt is eating very little. I will add 20 MEQ of KCL to her NS which is running at 36mk/hr (1.5MEQ/hr) and give 40 MEQ KCL po once. Expect K to improve as diet improves. Recheck in the AM  [employer], Pharm.D, BCPS Clinical Pharmacist  10/31/2017,7:31 AM

## 2017-10-31 NOTE — Progress Notes (Signed)
Chaplain was making rounds for extended stay Pt and visited  Pt after consulting nurse. Chaplain and Pt chit chatted. Pt didn't desire prayer.    10/31/17 1000  Clinical Encounter Type  Visited With Patient  Visit Type Initial;Spiritual support  Referral From [place] [facility]

## 2017-11-01 ENCOUNTER — Telehealth: Payer: Self-pay

## 2017-11-01 NOTE — Telephone Encounter (Signed)
TOC #1. Called to f/u after d/c from [facility] on 10/31/17. Also wanted to confirm hosp f/u appt w/ Dr. Ronnald Ramp on 11/07/17 @ 10:30am. Discharge planning included: d/c HCTZ, Claritin and Nystatin oint. and smoking cessation. Also included starting Lisinopril, Levaquin, Prednisone taper and Tamiflu. LVM requesting returned call.

## 2017-11-02 LAB — CULTURE, BLOOD (ROUTINE X 2)
CULTURE: NO GROWTH
Culture: NO GROWTH
SPECIAL REQUESTS: ADEQUATE
Special Requests: ADEQUATE

## 2017-11-07 ENCOUNTER — Encounter: Payer: Self-pay | Admitting: Family Medicine

## 2017-11-07 ENCOUNTER — Ambulatory Visit (INDEPENDENT_AMBULATORY_CARE_PROVIDER_SITE_OTHER): Payer: 59 | Admitting: Family Medicine

## 2017-11-07 VITALS — BP 104/80 | HR 112 | Resp 12 | Ht 64.0 in | Wt <= 1120 oz

## 2017-11-07 DIAGNOSIS — Z09 Encounter for follow-up examination after completed treatment for conditions other than malignant neoplasm: Secondary | ICD-10-CM | POA: Diagnosis not present

## 2017-11-07 DIAGNOSIS — I700 Atherosclerosis of aorta: Secondary | ICD-10-CM

## 2017-11-07 DIAGNOSIS — F17213 Nicotine dependence, cigarettes, with withdrawal: Secondary | ICD-10-CM

## 2017-11-07 DIAGNOSIS — J432 Centrilobular emphysema: Secondary | ICD-10-CM

## 2017-11-07 DIAGNOSIS — I7300 Raynaud's syndrome without gangrene: Secondary | ICD-10-CM

## 2017-11-07 DIAGNOSIS — E876 Hypokalemia: Secondary | ICD-10-CM | POA: Diagnosis not present

## 2017-11-07 DIAGNOSIS — Z23 Encounter for immunization: Secondary | ICD-10-CM

## 2017-11-07 MED ORDER — PREDNISONE 10 MG PO TABS: 10.0000 mg | ORAL_TABLET | Freq: Every day | ORAL | 0 refills | Status: DC

## 2017-11-07 MED ORDER — NICOTINE 21 MG/24HR TD PT24: 21.0000 mg | MEDICATED_PATCH | Freq: Every day | TRANSDERMAL | 0 refills | Status: DC

## 2017-11-07 NOTE — Patient Instructions (Signed)
Raynaud Phenomenon Raynaud phenomenon is a condition that affects the blood vessels (arteries) that carry blood to your fingers and toes. The arteries that supply blood to your ears or the tip of your nose might also be affected. Raynaud phenomenon causes the arteries to temporarily narrow. As a result, the flow of blood to the affected areas is temporarily decreased. This usually occurs in response to cold temperatures or stress. During an attack, the skin in the affected areas turns white. You may also feel tingling or numbness in those areas. Attacks usually last for only a brief period, and then the blood flow to the area returns to normal. In most cases, Raynaud phenomenon does not cause serious health problems. What are the causes? For many people with this condition, the cause is not known. Raynaud phenomenon is sometimes associated with other diseases, such as scleroderma or lupus. What increases the risk? Raynaud phenomenon can affect anyone, but it develops most often in people who are 20-40 years old. It affects more females than males. What are the signs or symptoms? Symptoms of Raynaud phenomenon may occur when you are exposed to cold temperatures or when you have emotional stress. The symptoms may last for a few minutes or up to several hours. They usually affect your fingers but may also affect your toes, ears, or the tip of your nose. Symptoms may include:  Changes in skin color. The skin in the affected areas will turn pale or white. The skin may then change from white to bluish to red as normal blood flow returns to the area.  Numbness, tingling, or pain in the affected areas.  In severe cases, sores may develop in the affected areas. How is this diagnosed? Your health care provider will do a physical exam and take your medical history. You may be asked to put your hands in cold water to check for a reaction to cold temperature. Blood tests may be done to check for other diseases or  conditions. Your health care provider may also order a test to check the movement of blood through your arteries and veins (vascular ultrasound). How is this treated? Treatment often involves making lifestyle changes and taking steps to control your exposure to cold temperatures. For more severe cases, medicine (calcium channel blockers) may be used to improve blood flow. Surgery is sometimes done to block the nerves that control the affected arteries, but this is rare. Follow these instructions at home:  Avoid exposure to cold by taking these steps: ? If possible, stay indoors during cold weather. ? When you go outside during cold weather, dress in layers and wear mittens, a hat, a scarf, and warm footwear. ? Wear mittens or gloves when handling ice or frozen food. ? Use holders for glasses or cans containing cold drinks. ? Let warm water run for a while before taking a shower or bath. ? Warm up the car before driving in cold weather.  If possible, avoid stressful and emotional situations. Exercise, meditation, and yoga may help you cope with stress. Biofeedback may be useful.  Do not use any tobacco products, including cigarettes, chewing tobacco, or electronic cigarettes. If you need help quitting, ask your health care provider.  Avoid secondhand smoke.  Limit your use of caffeine. Switch to decaffeinated coffee, tea, and soda. Avoid chocolate.  Wear loose fitting socks and comfortable, roomy shoes.  Avoid vibrating tools and machinery.  Take medicines only as directed by your health care provider. Contact a health care provider if:    Your discomfort becomes worse despite lifestyle changes.  You develop sores on your fingers or toes that do not heal.  Your fingers or toes turn black.  You have breaks in the skin on your fingers or toes.  You have a fever.  You have pain or swelling in your joints.  You have a rash.  Your symptoms occur on only one side of your body. This  information is not intended to replace advice given to you by your health care provider. Make sure you discuss any questions you have with your health care provider. Document Released: 09/14/2000 Document Revised: 02/23/2016 Document Reviewed: 03/21/2016 Elsevier Interactive Patient Education  2017 [employer] Nicotine skin patches What is this medicine? NICOTINE ([place] teen) helps people stop smoking. The patches replace the nicotine found in cigarettes and help to decrease withdrawal effects. They are most effective when used in combination with a stop-smoking program. This medicine may be used for other purposes; ask your health care provider or pharmacist if you have questions. COMMON BRAND NAME(S): Habitrol, Nicoderm CQ, Nicotrol What should I tell my health care provider before I take this medicine? They need to know if you have any of these conditions: -diabetes -heart disease, angina, irregular heartbeat or previous heart attack -high blood pressure -lung disease, including asthma -overactive thyroid -pheochromocytoma -seizures or a history of seizures -skin problems, like eczema -stomach problems or ulcers -an unusual or allergic reaction to nicotine, adhesives, other medicines, foods, dyes, or preservatives -pregnant or trying to get pregnant -breast-feeding How should I use this medicine? This medicine is for use on the skin. Follow the directions that come with the patches. Find an area of skin on your upper arm, chest, or back that is clean, dry, greaseless, undamaged and hairless. Wash hands with plain soap and water. Do not use anything that contains aloe, lanolin or glycerin as these may prevent the patch from sticking. Dry thoroughly. Remove the patch from the sealed pouch. Do not try to cut or trim the patch. Using your palm, press the patch firmly in place for 10 seconds to make sure that there is good contact with your skin. After applying the patch, wash your hands.  Change the patch every day, keeping to a regular schedule. When you apply a new patch, use a new area of skin. Wait at least 1 week before using the same area again. Talk to your pediatrician regarding the use of this medicine in children. Special care may be needed. Overdosage: If you think you have taken too much of this medicine contact a poison control center or emergency room at once. NOTE: This medicine is only for you. Do not share this medicine with others. What if I miss a dose? If you forget to replace a patch, use it as soon as you can. Only use one patch at a time and do not leave on the skin for longer than directed. If a patch falls off, you can replace it, but keep to your schedule and remove the patch at the right time. What may interact with this medicine? -medicines for asthma -medicines for blood pressure -medicines for mental depression This list may not describe all possible interactions. Give your health care provider a list of all the medicines, herbs, non-prescription drugs, or dietary supplements you use. Also tell them if you smoke, drink alcohol, or use illegal drugs. Some items may interact with your medicine. What should I watch for while using this medicine? You should begin using  the nicotine patch the day you stop smoking. It is okay if you do not succeed at your attempt to quit and have a cigarette. You can still continue your quit attempt and keep using the product as directed. Just throw away your cigarettes and get back to your quit plan. You can keep the patch in place during swimming, bathing, and showering. If your patch falls off during these activities, replace it. When you first apply the patch, your skin may itch or burn. This should go away soon. When you remove a patch, the skin may look red, but this should only last for a few days. Call your doctor or health care professional if skin redness does not go away after 4 days, if your skin swells, or if you get  a rash. If you are a diabetic and you quit smoking, the effects of insulin may be increased and you may need to reduce your insulin dose. Check with your doctor or health care professional about how you should adjust your insulin dose. If you are going to have a magnetic resonance imaging (MRI) procedure, tell your MRI technician if you have this patch on your body. It must be removed before a MRI. What side effects may I notice from receiving this medicine? Side effects that you should report to your doctor or health care professional as soon as possible: -allergic reactions like skin rash, itching or hives, swelling of the face, lips, or tongue -breathing problems -changes in hearing -changes in vision -chest pain -cold sweats -confusion -fast, irregular heartbeat -feeling faint or lightheaded, falls -headache -increased saliva -skin redness that lasts more than 4 days -stomach pain -signs and symptoms of nicotine overdose like nausea; vomiting; dizziness; weakness; and rapid heartbeat Side effects that usually do not require medical attention (report to your doctor or health care professional if they continue or are bothersome): -diarrhea -dry mouth -hiccups -irritability -nervousness or restlessness -trouble sleeping or vivid dreams This list may not describe all possible side effects. Call your doctor for medical advice about side effects. You may report side effects to FDA at 1-800-FDA-1088. Where should I keep my medicine? Keep out of the reach of children. Store at room temperature between 20 and 25 degrees C (68 and 77 degrees F). Protect from heat and light. Store in International aid/development worker until ready to use. Throw away unused medicine after the expiration date. When you remove a patch, fold with sticky sides together; put in an empty opened pouch and throw away. NOTE: This sheet is a summary. It may not cover all possible information. If you have questions about this  medicine, talk to your doctor, pharmacist, or health care provider.  2018 Elsevier/Gold Standard (2014-08-16 15:46:21)

## 2017-11-07 NOTE — Progress Notes (Signed)
Name: Lindsey Nguyen   MRN: [id number]    DOB: Feb 12, 1951   Date:11/07/2017       Progress Note  Subjective  Chief Complaint  Chief Complaint  Patient presents with  . Follow-up    hospital discharged on 10/31/17- pneumonia, flu and copd     Patient presents for followup from recent hospitalization for copd/pneumonia/ significant respiratory insufficiency.   Shortness of Breath  This is a chronic problem. The current episode started more than 1 year ago. The problem occurs intermittently. The problem has been waxing and waning. Associated symptoms include leg swelling and sputum production. Pertinent negatives include no abdominal pain, chest pain, coryza, ear pain, fever, headaches, leg pain, neck pain, orthopnea, PND, rash, sore throat or wheezing. Nothing aggravates the symptoms. She has tried beta agonist inhalers, ipratropium inhalers, steroid inhalers and oral steroids for the symptoms. The treatment provided moderate relief. Her past medical history is significant for bronchiolitis, chronic lung disease, COPD and pneumonia. There is no history of allergies, aspirin allergies, a heart failure or PE.    No problem-specific Assessment & Plan notes found for this encounter.   Past Medical History:  Diagnosis Date  . Cancer [facility])    right breast cancer with lumpectomy and rad tx  . COPD (chronic obstructive pulmonary disease) ([place])   . Diabetes mellitus without complication ([place])   . Hypertension   . Osteoporosis     Past Surgical History:  Procedure Laterality Date  . BREAST LUMPECTOMY     x 2  . BUNIONECTOMY Right   . COLONOSCOPY     normal  . PERIPHERAL VASCULAR CATHETERIZATION N/A 04/10/2016   Procedure: Lower Extremity Angiography;  Surgeon: Katha Cabal, MD;  Location: [place] [facility];  Service: Cardiovascular;  Laterality: N/A;  . PERIPHERAL VASCULAR CATHETERIZATION  04/10/2016   Procedure: Lower Extremity Intervention;  Surgeon: Katha Cabal, MD;   Location: [place] [facility];  Service: Cardiovascular;;  . TUBAL LIGATION      Family History  Problem Relation Age of Onset  . Diabetes Father   . Kidney cancer Father   . Cancer Brother   . Diabetes Paternal Aunt   . Diabetes Paternal Uncle   . Kidney failure Mother     Social History   Socioeconomic History  . Marital status: Divorced    Spouse name: Not on file  . Number of children: Not on file  . Years of education: Not on file  . Highest education level: Not on file  Social Needs  . Financial resource strain: Not on file  . Food insecurity - worry: Not on file  . Food insecurity - inability: Not on file  . Transportation needs - medical: Not on file  . Transportation needs - non-medical: Not on file  Occupational History  . Not on file  Tobacco Use  . Smoking status: Current Every Day Smoker    Packs/day: 1.00    Years: 50.00    Pack years: 50.00    Types: Cigarettes  . Smokeless tobacco: Never Used  Substance and Sexual Activity  . Alcohol use: Yes    Alcohol/week: 12.6 oz    Types: 14 Glasses of wine, 7 Cans of beer per week  . Drug use: No  . Sexual activity: No  Other Topics Concern  . Not on file  Social History Narrative  . Not on file    Allergies  Allergen Reactions  . Tetanus Toxoids Swelling    Outpatient Medications  Prior to Visit  Medication Sig Dispense Refill  . albuterol (PROVENTIL HFA;VENTOLIN HFA) 108 (90 Base) MCG/ACT inhaler Inhale 2 puffs into the lungs every 6 (six) hours as needed for wheezing or shortness of breath. 1 Inhaler 11  . fluticasone furoate-vilanterol (BREO ELLIPTA) 200-25 MCG/INH AEPB Inhale 1 puff into the lungs daily. 1 each 6  . lisinopril (PRINIVIL,ZESTRIL) 10 MG tablet Take 1 tablet (10 mg total) by mouth daily. 30 tablet 1  . metFORMIN (GLUCOPHAGE) 500 MG tablet Take 1 tablet (500 mg total) by mouth daily. 90 tablet 2  . tiotropium (SPIRIVA HANDIHALER) 18 MCG inhalation capsule Place into inhaler and  inhale.    Marland Kitchen guaiFENesin-codeine (ROBITUSSIN AC) 100-10 MG/5ML syrup Take 5 mLs by mouth 3 (three) times daily as needed for cough. (Patient not taking: Reported on 09/26/2017) 100 mL 0  . predniSONE (DELTASONE) 10 MG tablet Label  & dispense according to the schedule below. 5 Pills PO for 1 day then, 4 Pills PO for 1 day, 3 Pills PO for 1 day, 2 Pills PO for 1 day, 1 Pill PO for 1 days then STOP. 15 tablet 0   No facility-administered medications prior to visit.     Review of Systems  Constitutional: Negative for chills, fever, malaise/fatigue and weight loss.  HENT: Negative for ear discharge, ear pain and sore throat.   Eyes: Negative for blurred vision.  Respiratory: Positive for sputum production and shortness of breath. Negative for cough and wheezing.   Cardiovascular: Positive for leg swelling. Negative for chest pain, palpitations, orthopnea and PND.  Gastrointestinal: Negative for abdominal pain, blood in stool, constipation, diarrhea, heartburn, melena and nausea.  Genitourinary: Negative for dysuria, frequency, hematuria and urgency.  Musculoskeletal: Negative for back pain, joint pain, myalgias and neck pain.  Skin: Negative for rash.  Neurological: Negative for dizziness, tingling, sensory change, focal weakness and headaches.  Endo/Heme/Allergies: Negative for environmental allergies and polydipsia. Does not bruise/bleed easily.  Psychiatric/Behavioral: Negative for depression and suicidal ideas. The patient is not nervous/anxious and does not have insomnia.      Objective  Vitals:   11/07/17 1024  BP: 104/80  Pulse: (!) 112  Resp: 12  SpO2: 95%  Weight: 68 lb (30.8 kg)  Height: 5\' 4"  (1.626 m)    Physical Exam  Constitutional: She is well-developed, well-nourished, and in no distress. No distress.  HENT:  Head: Normocephalic and atraumatic.  Right Ear: External ear normal.  Left Ear: External ear normal.  Nose: Nose normal.  Mouth/Throat: Oropharynx is clear  and moist.  Eyes: Conjunctivae and EOM are normal. Pupils are equal, round, and reactive to light. Right eye exhibits no discharge. Left eye exhibits no discharge.  Neck: Normal range of motion. Neck supple. No JVD present. No thyromegaly present.  Cardiovascular: Normal rate, regular rhythm, normal heart sounds and intact distal pulses. Exam reveals no gallop and no friction rub.  No murmur heard. Pulmonary/Chest: Effort normal. No respiratory distress. She has wheezes. She has no rales.  Abdominal: Soft. Bowel sounds are normal. She exhibits no mass. There is no tenderness. There is no guarding.  Musculoskeletal: Normal range of motion. She exhibits no edema.  Lymphadenopathy:    She has no cervical adenopathy.  Neurological: She is alert. She has normal reflexes.  Skin: Skin is warm and dry. She is not diaphoretic.  Psychiatric: Mood and affect normal.  Nursing note and vitals reviewed.     Assessment & Plan  Problem List Items Addressed This Visit  Cardiovascular and Mediastinum   Aortic atherosclerosis (HCC)     Respiratory   COLD (chronic obstructive lung disease) (HCC)   Relevant Medications   predniSONE (DELTASONE) 10 MG tablet   nicotine (NICODERM CQ) 21 mg/24hr patch    Other Visit Diagnoses    Hospital discharge follow-up    -  Primary   Cigarette nicotine dependence with withdrawal       Relevant Medications   nicotine (NICODERM CQ) 21 mg/24hr patch   Raynaud's disease without gangrene       Influenza vaccine needed       Relevant Orders   Flu Vaccine QUAD 6+ mos PF IM (Fluarix Quad PF) (Completed)   Hypokalemia       Relevant Orders   Renal function panel   Hypomagnesemia       Relevant Orders   Magnesium      Meds ordered this encounter  Medications  . predniSONE (DELTASONE) 10 MG tablet    Sig: Take 1 tablet (10 mg total) by mouth daily with breakfast.    Dispense:  30 tablet    Refill:  0  . nicotine (NICODERM CQ) 21 mg/24hr patch    Sig:  Place 1 patch (21 mg total) onto the skin daily.    Dispense:  28 patch    Refill:  0   Patient has been advised of the health risks of smoking and counseled concerning cessation of tobacco products. I spent over 3 minutes for discussion and to answer questions.   Dr. Macon [facility]  11/07/17

## 2017-11-08 LAB — RENAL FUNCTION PANEL
Albumin: 4.2 g/dL (ref 3.6–4.8)
BUN / CREAT RATIO: 18 (ref 12–28)
BUN: 12 mg/dL (ref 8–27)
CALCIUM: 8.8 mg/dL (ref 8.7–10.3)
CO2: 23 mmol/L (ref 20–29)
CREATININE: 0.65 mg/dL (ref 0.57–1.00)
Chloride: 85 mmol/L — ABNORMAL LOW (ref 96–106)
GFR calc Af Amer: 108 mL/min/{1.73_m2} (ref >59)
GFR calc non Af Amer: 93 mL/min/{1.73_m2} (ref >59)
Glucose: 139 mg/dL — ABNORMAL HIGH (ref 65–99)
Phosphorus: 3.6 mg/dL (ref 2.5–4.5)
Potassium: 4.3 mmol/L (ref 3.5–5.2)
SODIUM: 129 mmol/L — AB (ref 134–144)

## 2017-11-08 LAB — MAGNESIUM: MAGNESIUM: 1.1 mg/dL — AB (ref 1.6–2.3)

## 2017-12-04 ENCOUNTER — Other Ambulatory Visit: Payer: Self-pay | Admitting: Family Medicine

## 2017-12-04 DIAGNOSIS — J432 Centrilobular emphysema: Secondary | ICD-10-CM

## 2017-12-26 ENCOUNTER — Ambulatory Visit: Payer: 59 | Admitting: Family Medicine

## 2017-12-26 ENCOUNTER — Encounter: Payer: Self-pay | Admitting: Family Medicine

## 2017-12-26 VITALS — BP 130/84 | HR 116 | Resp 14 | Ht 64.0 in | Wt <= 1120 oz

## 2017-12-26 DIAGNOSIS — F17210 Nicotine dependence, cigarettes, uncomplicated: Secondary | ICD-10-CM | POA: Diagnosis not present

## 2017-12-26 DIAGNOSIS — J42 Unspecified chronic bronchitis: Secondary | ICD-10-CM

## 2017-12-26 DIAGNOSIS — J432 Centrilobular emphysema: Secondary | ICD-10-CM

## 2017-12-26 DIAGNOSIS — J0140 Acute pansinusitis, unspecified: Secondary | ICD-10-CM

## 2017-12-26 DIAGNOSIS — J209 Acute bronchitis, unspecified: Secondary | ICD-10-CM | POA: Diagnosis not present

## 2017-12-26 MED ORDER — LEVOFLOXACIN 500 MG PO TABS: 500.0000 mg | ORAL_TABLET | Freq: Every day | ORAL | 0 refills | Status: DC

## 2017-12-26 NOTE — Progress Notes (Signed)
Name: Lindsey Nguyen   MRN: [id number]    DOB: April 17, 1951   Date:12/26/2017       Progress Note  Subjective  Chief Complaint  Chief Complaint  Patient presents with  . Sinusitis    started with allergy sx x 2 days, got worse with cong last night. Claritin not helping. Still smoking    Sinusitis  This is a recurrent problem. The current episode started in the past 7 days. The problem has been gradually worsening since onset. There has been no fever. Her pain is at a severity of 0/10. She is experiencing no pain. Associated symptoms include chills, congestion, coughing, headaches, a hoarse voice, neck pain and shortness of breath. Pertinent negatives include no diaphoresis, ear pain, sinus pressure, sneezing, sore throat or swollen glands. Past treatments include nothing. The treatment provided moderate relief.  Cough  This is a recurrent problem. The current episode started in the past 7 days. The problem has been gradually worsening. The problem occurs every few minutes. The cough is productive of purulent sputum (yellow). Associated symptoms include chills, ear congestion, headaches, nasal congestion, postnasal drip and shortness of breath. Pertinent negatives include no chest pain, ear pain, fever, heartburn, hemoptysis, myalgias, rash, rhinorrhea, sore throat, weight loss or wheezing. There is no history of environmental allergies.    No problem-specific Assessment & Plan notes found for this encounter.   Past Medical History:  Diagnosis Date  . Cancer [facility])    right breast cancer with lumpectomy and rad tx  . COPD (chronic obstructive pulmonary disease) ([place])   . Diabetes mellitus without complication ([place])   . Hypertension   . Osteoporosis     Past Surgical History:  Procedure Laterality Date  . BREAST LUMPECTOMY     x 2  . BUNIONECTOMY Right   . COLONOSCOPY     normal  . PERIPHERAL VASCULAR CATHETERIZATION N/A 04/10/2016   Procedure: Lower Extremity Angiography;  Surgeon:  Katha Cabal, MD;  Location: [place] [facility];  Service: Cardiovascular;  Laterality: N/A;  . PERIPHERAL VASCULAR CATHETERIZATION  04/10/2016   Procedure: Lower Extremity Intervention;  Surgeon: Katha Cabal, MD;  Location: [place] [facility];  Service: Cardiovascular;;  . TUBAL LIGATION      Family History  Problem Relation Age of Onset  . Diabetes Father   . Kidney cancer Father   . Cancer Brother   . Diabetes Paternal Aunt   . Diabetes Paternal Uncle   . Kidney failure Mother     Social History   Socioeconomic History  . Marital status: Divorced    Spouse name: Not on file  . Number of children: Not on file  . Years of education: Not on file  . Highest education level: Not on file  Occupational History  . Not on file  Social Needs  . Financial resource strain: Not on file  . Food insecurity:    Worry: Not on file    Inability: Not on file  . Transportation needs:    Medical: Not on file    Non-medical: Not on file  Tobacco Use  . Smoking status: Current Every Day Smoker    Packs/day: 1.00    Years: 50.00    Pack years: 50.00    Types: Cigarettes  . Smokeless tobacco: Never Used  Substance and Sexual Activity  . Alcohol use: Yes    Alcohol/week: 12.6 oz    Types: 14 Glasses of wine, 7 Cans of beer per week  .  Drug use: No  . Sexual activity: Never  Lifestyle  . Physical activity:    Days per week: Not on file    Minutes per session: Not on file  . Stress: Not on file  Relationships  . Social connections:    Talks on phone: Not on file    Gets together: Not on file    Attends religious service: Not on file    Active member of club or organization: Not on file    Attends meetings of clubs or organizations: Not on file    Relationship status: Not on file  . Intimate partner violence:    Fear of current or ex partner: Not on file    Emotionally abused: Not on file    Physically abused: Not on file    Forced sexual activity: Not on file   Other Topics Concern  . Not on file  Social History Narrative  . Not on file    Allergies  Allergen Reactions  . Tetanus Toxoids Swelling    Outpatient Medications Prior to Visit  Medication Sig Dispense Refill  . albuterol (PROVENTIL HFA;VENTOLIN HFA) 108 (90 Base) MCG/ACT inhaler Inhale 2 puffs into the lungs every 6 (six) hours as needed for wheezing or shortness of breath. 1 Inhaler 11  . fluticasone furoate-vilanterol (BREO ELLIPTA) 200-25 MCG/INH AEPB Inhale 1 puff into the lungs daily. 1 each 6  . lisinopril (PRINIVIL,ZESTRIL) 10 MG tablet Take 1 tablet (10 mg total) by mouth daily. 30 tablet 1  . metFORMIN (GLUCOPHAGE) 500 MG tablet Take 1 tablet (500 mg total) by mouth daily. 90 tablet 2  . tiotropium (SPIRIVA HANDIHALER) 18 MCG inhalation capsule Place into inhaler and inhale.    . nicotine (NICODERM CQ) 21 mg/24hr patch Place 1 patch (21 mg total) onto the skin daily. (Patient not taking: Reported on 12/26/2017) 28 patch 0  . guaiFENesin-codeine (ROBITUSSIN AC) 100-10 MG/5ML syrup Take 5 mLs by mouth 3 (three) times daily as needed for cough. (Patient not taking: Reported on 09/26/2017) 100 mL 0  . predniSONE (DELTASONE) 10 MG tablet Take 1 tablet (10 mg total) by mouth daily with breakfast. 30 tablet 0   No facility-administered medications prior to visit.     Review of Systems  Constitutional: Positive for chills. Negative for diaphoresis, fever, malaise/fatigue and weight loss.  HENT: Positive for congestion, hoarse voice, postnasal drip and sinus pain. Negative for ear discharge, ear pain, rhinorrhea, sinus pressure, sneezing and sore throat.   Eyes: Negative for blurred vision.  Respiratory: Positive for cough and shortness of breath. Negative for hemoptysis, sputum production and wheezing.   Cardiovascular: Negative for chest pain, palpitations and leg swelling.  Gastrointestinal: Negative for abdominal pain, blood in stool, constipation, diarrhea, heartburn, melena  and nausea.  Genitourinary: Negative for dysuria, frequency, hematuria and urgency.  Musculoskeletal: Positive for neck pain. Negative for back pain, joint pain and myalgias.  Skin: Negative for rash.  Neurological: Positive for headaches. Negative for dizziness, tingling, sensory change and focal weakness.  Endo/Heme/Allergies: Negative for environmental allergies and polydipsia. Does not bruise/bleed easily.  Psychiatric/Behavioral: Negative for depression and suicidal ideas. The patient is not nervous/anxious and does not have insomnia.      Objective  Vitals:   12/26/17 1343  BP: 130/84  Pulse: (!) 116  Resp: 14  SpO2: 96%  Weight: 68 lb (30.8 kg)  Height: 5\' 4"  (1.626 m)    Physical Exam  Constitutional: She is well-developed, well-nourished, and in no distress. No distress.  HENT:  Head: Normocephalic and atraumatic.  Right Ear: Tympanic membrane, external ear and ear canal normal.  Left Ear: Tympanic membrane, external ear and ear canal normal.  Nose: Right sinus exhibits maxillary sinus tenderness and frontal sinus tenderness. Left sinus exhibits maxillary sinus tenderness and frontal sinus tenderness.  Mouth/Throat: Uvula is midline, oropharynx is clear and moist and mucous membranes are normal.  Eyes: Pupils are equal, round, and reactive to light. Conjunctivae and EOM are normal. Right eye exhibits no discharge. Left eye exhibits no discharge.  Neck: Normal range of motion. Neck supple. No JVD present. No thyromegaly present.  Cardiovascular: Normal rate, regular rhythm, normal heart sounds and intact distal pulses. Exam reveals no gallop and no friction rub.  No murmur heard. Pulmonary/Chest: Effort normal and breath sounds normal. She has no wheezes. She has no rales.  Abdominal: Soft. Bowel sounds are normal. She exhibits no mass. There is no tenderness. There is no guarding.  Musculoskeletal: Normal range of motion. She exhibits no edema.  Lymphadenopathy:    She  has no cervical adenopathy.  Neurological: She is alert. She has normal reflexes.  Skin: Skin is warm and dry. She is not diaphoretic.  Psychiatric: Mood and affect normal.  Nursing note and vitals reviewed.     Assessment & Plan  Problem List Items Addressed This Visit      Respiratory   COLD (chronic obstructive lung disease) ([place])    Other Visit Diagnoses    Acute pansinusitis, recurrence not specified    -  Primary   Relevant Medications   levofloxacin (LEVAQUIN) 500 MG tablet   Chronic bronchitis with acute exacerbation (HCC)       Relevant Medications   levofloxacin (LEVAQUIN) 500 MG tablet   Cigarette nicotine dependence without complication          Meds ordered this encounter  Medications  . levofloxacin (LEVAQUIN) 500 MG tablet    Sig: Take 1 tablet (500 mg total) by mouth daily.    Dispense:  7 tablet    Refill:  0      Dr. Mac[facility]  12/26/17

## 2018-04-02 ENCOUNTER — Ambulatory Visit (INDEPENDENT_AMBULATORY_CARE_PROVIDER_SITE_OTHER): Payer: 59 | Admitting: Family Medicine

## 2018-04-02 ENCOUNTER — Encounter: Payer: Self-pay | Admitting: Family Medicine

## 2018-04-02 VITALS — BP 120/98 | HR 106 | Temp 98.0°F | Ht 64.0 in | Wt 71.8 lb

## 2018-04-02 DIAGNOSIS — J42 Unspecified chronic bronchitis: Secondary | ICD-10-CM

## 2018-04-02 DIAGNOSIS — J209 Acute bronchitis, unspecified: Secondary | ICD-10-CM | POA: Diagnosis not present

## 2018-04-02 DIAGNOSIS — J0140 Acute pansinusitis, unspecified: Secondary | ICD-10-CM | POA: Diagnosis not present

## 2018-04-02 MED ORDER — LEVOFLOXACIN 500 MG PO TABS: 500.0000 mg | ORAL_TABLET | Freq: Every day | ORAL | 0 refills | Status: DC

## 2018-04-02 NOTE — Progress Notes (Signed)
Name: BUFFY EHLER   MRN: [id number]    DOB: 1950-11-15   Date:04/02/2018       Progress Note  Subjective  Chief Complaint  Chief Complaint  Patient presents with  . Sinusitis    started with a runny nose and now has a cough    Sinusitis  This is a new problem. The current episode started today. The problem has been gradually worsening since onset. There has been no fever. The fever has been present for 5 days or more. The pain is moderate. Pertinent negatives include no chills, congestion, coughing, diaphoresis, ear pain, headaches, hoarse voice, neck pain, shortness of breath, sinus pressure, sneezing, sore throat or swollen glands. The treatment provided mild relief.    Acute pansinusitis Symptoms As previous. Will treat levoquin 500 mg daily for 7 days.  Chronic bronchitis with acute exacerbation (HCC) Recurrent exacerbation of bronchitis with underlying COPD. Continuance BREO and albuterol inhaler with initiation of levoquin.   Past Medical History:  Diagnosis Date  . Cancer [facility])    right breast cancer with lumpectomy and rad tx  . COPD (chronic obstructive pulmonary disease) ([place])   . Diabetes mellitus without complication ([place])   . Hypertension   . Osteoporosis     Past Surgical History:  Procedure Laterality Date  . BREAST LUMPECTOMY     x 2  . BUNIONECTOMY Right   . COLONOSCOPY     normal  . PERIPHERAL VASCULAR CATHETERIZATION N/A 04/10/2016   Procedure: Lower Extremity Angiography;  Surgeon: Katha Cabal, MD;  Location: [place] [facility];  Service: Cardiovascular;  Laterality: N/A;  . PERIPHERAL VASCULAR CATHETERIZATION  04/10/2016   Procedure: Lower Extremity Intervention;  Surgeon: Katha Cabal, MD;  Location: [place] [facility];  Service: Cardiovascular;;  . TUBAL LIGATION      Family History  Problem Relation Age of Onset  . Diabetes Father   . Kidney cancer Father   . Cancer Brother   . Diabetes Paternal Aunt   . Diabetes Paternal  Uncle   . Kidney failure Mother     Social History   Socioeconomic History  . Marital status: Divorced    Spouse name: Not on file  . Number of children: Not on file  . Years of education: Not on file  . Highest education level: Not on file  Occupational History  . Not on file  Social Needs  . Financial resource strain: Not on file  . Food insecurity:    Worry: Not on file    Inability: Not on file  . Transportation needs:    Medical: Not on file    Non-medical: Not on file  Tobacco Use  . Smoking status: Current Every Day Smoker    Packs/day: 1.00    Years: 50.00    Pack years: 50.00    Types: Cigarettes  . Smokeless tobacco: Never Used  Substance and Sexual Activity  . Alcohol use: Yes    Alcohol/week: 12.6 oz    Types: 14 Glasses of wine, 7 Cans of beer per week  . Drug use: No  . Sexual activity: Never  Lifestyle  . Physical activity:    Days per week: Not on file    Minutes per session: Not on file  . Stress: Not on file  Relationships  . Social connections:    Talks on phone: Not on file    Gets together: Not on file    Attends religious service: Not on file  Active member of club or organization: Not on file    Attends meetings of clubs or organizations: Not on file    Relationship status: Not on file  . Intimate partner violence:    Fear of current or ex partner: Not on file    Emotionally abused: Not on file    Physically abused: Not on file    Forced sexual activity: Not on file  Other Topics Concern  . Not on file  Social History Narrative  . Not on file    Allergies  Allergen Reactions  . Tetanus Toxoids Swelling    Outpatient Medications Prior to Visit  Medication Sig Dispense Refill  . albuterol (PROVENTIL HFA;VENTOLIN HFA) 108 (90 Base) MCG/ACT inhaler Inhale 2 puffs into the lungs every 6 (six) hours as needed for wheezing or shortness of breath. 1 Inhaler 11  . fluticasone furoate-vilanterol (BREO ELLIPTA) 200-25 MCG/INH AEPB  Inhale 1 puff into the lungs daily. 1 each 6  . lisinopril (PRINIVIL,ZESTRIL) 10 MG tablet Take 1 tablet (10 mg total) by mouth daily. 30 tablet 1  . metFORMIN (GLUCOPHAGE) 500 MG tablet Take 1 tablet (500 mg total) by mouth daily. 90 tablet 2  . tiotropium (SPIRIVA HANDIHALER) 18 MCG inhalation capsule Place into inhaler and inhale.    . levofloxacin (LEVAQUIN) 500 MG tablet Take 1 tablet (500 mg total) by mouth daily. 7 tablet 0  . nicotine (NICODERM CQ) 21 mg/24hr patch Place 1 patch (21 mg total) onto the skin daily. (Patient not taking: Reported on 12/26/2017) 28 patch 0   No facility-administered medications prior to visit.     Review of Systems  Constitutional: Negative for chills, diaphoresis, fever, malaise/fatigue and weight loss.  HENT: Negative for congestion, ear discharge, ear pain, hoarse voice, sinus pressure, sneezing and sore throat.   Eyes: Negative for blurred vision.  Respiratory: Negative for cough, sputum production, shortness of breath and wheezing.   Cardiovascular: Negative for chest pain, palpitations and leg swelling.  Gastrointestinal: Negative for abdominal pain, blood in stool, constipation, diarrhea, heartburn, melena and nausea.  Genitourinary: Negative for dysuria, frequency, hematuria and urgency.  Musculoskeletal: Negative for back pain, joint pain, myalgias and neck pain.  Skin: Negative for rash.  Neurological: Negative for dizziness, tingling, sensory change, focal weakness and headaches.  Endo/Heme/Allergies: Negative for environmental allergies and polydipsia. Does not bruise/bleed easily.  Psychiatric/Behavioral: Negative for depression and suicidal ideas. The patient is not nervous/anxious and does not have insomnia.      Objective  Vitals:   04/02/18 1131  BP: (!) 120/98  Pulse: (!) 106  Temp: 98 F (36.7 C)  TempSrc: Oral  SpO2: 94%  Weight: 71 lb 12.8 oz (32.6 kg)  Height: 5\' 4"  (1.626 m)    Physical Exam  Constitutional: No  distress.  HENT:  Head: Normocephalic and atraumatic.  Right Ear: External ear normal.  Left Ear: External ear normal.  Nose: Nose normal.  Mouth/Throat: Oropharynx is clear and moist.  Eyes: Pupils are equal, round, and reactive to light. Conjunctivae and EOM are normal. Right eye exhibits no discharge. Left eye exhibits no discharge.  Neck: Normal range of motion. Neck supple. No JVD present. No thyromegaly present.  Cardiovascular: Normal rate, regular rhythm, normal heart sounds and intact distal pulses. Exam reveals no gallop and no friction rub.  No murmur heard. Pulmonary/Chest: Effort normal and breath sounds normal. She has no wheezes. She has no rales.  Abdominal: Soft. Bowel sounds are normal. She exhibits no mass. There is  no tenderness. There is no guarding.  Musculoskeletal: Normal range of motion. She exhibits no edema.  Lymphadenopathy:    She has no cervical adenopathy.  Neurological: She is alert. She has normal reflexes.  Skin: Skin is warm and dry. She is not diaphoretic.  Nursing note and vitals reviewed.     Assessment & Plan  Problem List Items Addressed This Visit      Respiratory   Acute pansinusitis - Primary    Symptoms As previous. Will treat levoquin 500 mg daily for 7 days.      Relevant Medications   levofloxacin (LEVAQUIN) 500 MG tablet   Chronic bronchitis with acute exacerbation (HCC)    Recurrent exacerbation of bronchitis with underlying COPD. Continuance BREO and albuterol inhaler with initiation of levoquin.      Relevant Medications   levofloxacin (LEVAQUIN) 500 MG tablet      Meds ordered this encounter  Medications  . levofloxacin (LEVAQUIN) 500 MG tablet    Sig: Take 1 tablet (500 mg total) by mouth daily.    Dispense:  7 tablet    Refill:  0      Dr. [facility]  04/02/18

## 2018-04-02 NOTE — Assessment & Plan Note (Signed)
Recurrent exacerbation of bronchitis with underlying COPD. Continuance BREO and albuterol inhaler with initiation of levoquin.

## 2018-04-02 NOTE — Assessment & Plan Note (Signed)
Symptoms As previous. Will treat levoquin 500 mg daily for 7 days.

## 2018-04-08 ENCOUNTER — Other Ambulatory Visit: Payer: Self-pay

## 2018-04-08 ENCOUNTER — Emergency Department
Admission: EM | Admit: 2018-04-08 | Discharge: 2018-04-08 | Disposition: A | Discharge status: Home / Self Care | Payer: 59 | Attending: Emergency Medicine | Admitting: Emergency Medicine

## 2018-04-08 ENCOUNTER — Emergency Department: Payer: 59

## 2018-04-08 DIAGNOSIS — I10 Essential (primary) hypertension: Secondary | ICD-10-CM | POA: Insufficient documentation

## 2018-04-08 DIAGNOSIS — J441 Chronic obstructive pulmonary disease with (acute) exacerbation: Secondary | ICD-10-CM | POA: Diagnosis not present

## 2018-04-08 DIAGNOSIS — R0602 Shortness of breath: Secondary | ICD-10-CM | POA: Diagnosis present

## 2018-04-08 DIAGNOSIS — Z7984 Long term (current) use of oral hypoglycemic drugs: Secondary | ICD-10-CM | POA: Insufficient documentation

## 2018-04-08 DIAGNOSIS — E1165 Type 2 diabetes mellitus with hyperglycemia: Secondary | ICD-10-CM | POA: Diagnosis not present

## 2018-04-08 DIAGNOSIS — Z79899 Other long term (current) drug therapy: Secondary | ICD-10-CM | POA: Insufficient documentation

## 2018-04-08 DIAGNOSIS — R739 Hyperglycemia, unspecified: Secondary | ICD-10-CM

## 2018-04-08 LAB — CBC
HCT: 39.9 % (ref 35.0–47.0)
HEMOGLOBIN: 14.2 g/dL (ref 12.0–16.0)
MCH: 32.4 pg (ref 26.0–34.0)
MCHC: 35.5 g/dL (ref 32.0–36.0)
MCV: 91.4 fL (ref 80.0–100.0)
Platelets: 258 10*3/uL (ref 150–440)
RBC: 4.37 MIL/uL (ref 3.80–5.20)
RDW: 12.9 % (ref 11.5–14.5)
WBC: 9.5 10*3/uL (ref 3.6–11.0)

## 2018-04-08 LAB — BLOOD GAS, VENOUS
Acid-Base Excess: 5.4 mmol/L — ABNORMAL HIGH (ref 0.0–2.0)
BICARBONATE: 32.8 mmol/L — AB (ref 20.0–28.0)
O2 SAT: UNDETERMINED %
PATIENT TEMPERATURE: 37
PCO2 VEN: 58 mmHg (ref 44.0–60.0)
PH VEN: 7.36 (ref 7.250–7.430)

## 2018-04-08 LAB — BASIC METABOLIC PANEL
ANION GAP: 10 (ref 5–15)
BUN: 18 mg/dL (ref 8–23)
CALCIUM: 8.9 mg/dL (ref 8.9–10.3)
CO2: 27 mmol/L (ref 22–32)
Chloride: 93 mmol/L — ABNORMAL LOW (ref 98–111)
Creatinine, Ser: 0.56 mg/dL (ref 0.44–1.00)
GFR calc non Af Amer: >60 mL/min (ref >60)
GLUCOSE: 178 mg/dL — AB (ref 70–99)
POTASSIUM: 3.6 mmol/L (ref 3.5–5.1)
Sodium: 130 mmol/L — ABNORMAL LOW (ref 135–145)

## 2018-04-08 LAB — TROPONIN I

## 2018-04-08 LAB — LACTIC ACID, PLASMA
LACTIC ACID, VENOUS: 1.9 mmol/L (ref 0.5–1.9)
Lactic Acid, Venous: 1.7 mmol/L (ref 0.5–1.9)

## 2018-04-08 MED ORDER — IPRATROPIUM-ALBUTEROL 0.5-2.5 (3) MG/3ML IN SOLN
3.0000 mL | Freq: Once | RESPIRATORY_TRACT | Status: AC
  Administered 2018-04-08: 3 mL via RESPIRATORY_TRACT
  Filled 2018-04-08: qty 3

## 2018-04-08 MED ORDER — VANCOMYCIN HCL IN DEXTROSE 750-5 MG/150ML-% IV SOLN
750.0000 mg | Freq: Once | INTRAVENOUS | Status: AC
  Administered 2018-04-08: 750 mg via INTRAVENOUS
  Filled 2018-04-08: qty 150

## 2018-04-08 MED ORDER — PIPERACILLIN-TAZOBACTAM 3.375 G IVPB 30 MIN
3.3750 g | Freq: Once | INTRAVENOUS | Status: AC
  Administered 2018-04-08: 3.375 g via INTRAVENOUS
  Filled 2018-04-08: qty 50

## 2018-04-08 MED ORDER — AZITHROMYCIN 250 MG PO TABS
500.0000 mg | ORAL_TABLET | Freq: Every day | ORAL | 0 refills | Status: AC
Start: 2018-04-08 — End: 2018-04-12

## 2018-04-08 MED ORDER — AZITHROMYCIN 500 MG PO TABS
500.0000 mg | ORAL_TABLET | Freq: Once | ORAL | Status: AC
  Administered 2018-04-08: 500 mg via ORAL
  Filled 2018-04-08: qty 1

## 2018-04-08 MED ORDER — METHYLPREDNISOLONE SODIUM SUCC 125 MG IJ SOLR
125.0000 mg | Freq: Once | INTRAMUSCULAR | Status: AC
  Administered 2018-04-08: 125 mg via INTRAVENOUS
  Filled 2018-04-08: qty 2

## 2018-04-08 MED ORDER — SODIUM CHLORIDE 0.9 % IV BOLUS
750.0000 mL | Freq: Once | INTRAVENOUS | Status: AC
Start: 2018-04-08 — End: 2018-04-08
  Administered 2018-04-08: 750 mL via INTRAVENOUS

## 2018-04-08 MED ORDER — PREDNISONE 20 MG PO TABS: 60.0000 mg | ORAL_TABLET | Freq: Every day | ORAL | 0 refills | Status: DC

## 2018-04-08 NOTE — ED Notes (Signed)
Pt ambulated to door and back. Pt stated she felt fine and O2 stayed at 100%. As pt was getting back in bed she then stated she felt winded and O2 at 91%. Will inform MD Mariea Clonts

## 2018-04-08 NOTE — ED Notes (Signed)
Recollect tubes sent to lab. Green , lav gray

## 2018-04-08 NOTE — ED Triage Notes (Signed)
Pt comes via [facility] from work with [facility]. Pt states that it has been going on for several days. Pt being treated for a sinus issue. Per EMS pt unable to tolerate nasal cannula and only got about half a duo neb. BP-200/100. EMS also states bilateral wheezing. Pt does have hx of COPD. Pt denies any pain at this time.

## 2018-04-08 NOTE — ED Notes (Signed)
Report given to Jennifer RN

## 2018-04-08 NOTE — ED Notes (Signed)
Pt back from x-ray.

## 2018-04-08 NOTE — ED Notes (Signed)
Pt transported to xray 

## 2018-04-08 NOTE — Discharge Instructions (Addendum)
Please continue all of your regular COPD medications, and use your albuterol inhaler every 4 hours as needed.  Please take the entire course of steroids and antibiotics to help with your symptoms.  Return to the emergency department if you develop severe pain, shortness of breath, lightheadedness or fainting, fever, or any other symptoms concerning to you.

## 2018-04-08 NOTE — ED Provider Notes (Signed)
[facility] Emergency Department Provider Note  ____________________________________________  Time seen: Approximately 5:09 PM  I have reviewed the triage vital signs and the nursing notes.   HISTORY  Chief Complaint Shortness of Breath    HPI Lindsey Nguyen is a 67 y.o. female with a history of COPD not on oxygen, HTN, DM, remote history of right breast cancer status post lumpectomy and radiation presenting for shortness of breath.  The patient was seen by her primary care physician 6 days ago and put on antibiotics for sinus infection.  After 2 days, her sinus infection began to improve, but she began to develop shortness of breath with wheezing which has gotten progressively worse.  Today, the patient was at work when she felt worse so came to the emergency department for further evaluation.  She has not had any fever or chills, lightheadedness or syncope, chest pain, palpitations.  She has had a mild nonproductive cough and her sinus symptoms have improved.  She has previously been on steroids for COPD exacerbations but is not currently using steroids.  Past Medical History:  Diagnosis Date  . Cancer [facility])    right breast cancer with lumpectomy and rad tx  . COPD (chronic obstructive pulmonary disease) ([place])   . Diabetes mellitus without complication ([place])   . Hypertension   . Osteoporosis     Patient Active Problem List   Diagnosis Date Noted  . Acute pansinusitis 04/02/2018  . Chronic bronchitis with acute exacerbation ([place]) 04/02/2018  . Acute on chronic respiratory failure with hypoxia ([place]) 10/28/2017  . Diabetes mellitus without complication ([place]) 31/51/7616  . Aortic atherosclerosis ([place]) 05/08/2016  . Allergic rhinitis due to pollen 05/08/2016  . Excessive weight loss 02/28/2016  . Essential hypertension 05/19/2015  . Prediabetes 05/19/2015  . Osteoporosis 05/19/2015  . COLD (chronic obstructive lung disease) ([place]) 05/19/2015    Past  Surgical History:  Procedure Laterality Date  . BREAST LUMPECTOMY     x 2  . BUNIONECTOMY Right   . COLONOSCOPY     normal  . PERIPHERAL VASCULAR CATHETERIZATION N/A 04/10/2016   Procedure: Lower Extremity Angiography;  Surgeon: Katha Cabal, MD;  Location: [place] CV LAB;  Service: Cardiovascular;  Laterality: N/A;  . PERIPHERAL VASCULAR CATHETERIZATION  04/10/2016   Procedure: Lower Extremity Intervention;  Surgeon: Katha Cabal, MD;  Location: [place] CV LAB;  Service: Cardiovascular;;  . TUBAL LIGATION      Current Outpatient Rx  . Order #: [id number] Class: Normal  . Order #: [id number] Class: Normal  . Order #: [id number] Class: Normal  . Order #: [id number] Class: Historical Med  . Order #: [id number] Class: Historical Med  . Order #: [id number] Class: Normal  . Order #: [id number] Class: Historical Med  . Order #: [id number] Class: Print  . Order #: [id number] Class: Print  . Order #: [id number] Class: Print    Allergies Tetanus toxoids  Family History  Problem Relation Age of Onset  . Diabetes Father   . Kidney cancer Father   . Cancer Brother   . Diabetes Paternal Aunt   . Diabetes Paternal Uncle   . Kidney failure Mother     Social History Social History   Tobacco Use  . Smoking status: Current Every Day Smoker    Packs/day: 1.00    Years: 50.00    Pack years: 50.00    Types: Cigarettes  . Smokeless tobacco: Never Used  Substance Use Topics  . Alcohol use: Yes    Alcohol/week: 12.6 oz  Types: 14 Glasses of wine, 7 Cans of beer per week  . Drug use: No    Review of Systems Constitutional: No fever/chills.  No lightheadedness or syncope. Eyes: No visual changes. ENT: No sore throat. No congestion or rhinorrhea.  Positive sinus pressure, now improved. Cardiovascular: Denies chest pain. Denies palpitations. Respiratory: Positive shortness of breath.  Positive cough. Gastrointestinal: No abdominal pain.  No nausea, no vomiting.  No diarrhea.  No  constipation. Genitourinary: Negative for dysuria. Musculoskeletal: Negative for back pain. Skin: Negative for rash. Neurological: Negative for headaches. No focal numbness, tingling or weakness.     ____________________________________________   PHYSICAL EXAM:  VITAL SIGNS: ED Triage Vitals  Enc Vitals Group     BP 04/08/18 1703 (!) 151/92     Pulse Rate 04/08/18 1703 99     Resp 04/08/18 1703 (!) 23     Temp 04/08/18 1703 97.8 F (36.6 C)     Temp Source 04/08/18 1703 Oral     SpO2 04/08/18 1703 99 %     Weight 04/08/18 1701 71 lb (32.2 kg)     Height 04/08/18 1701 5\' 4"  (1.626 m)     Head Circumference --      Peak Flow --      Pain Score 04/08/18 1700 0     Pain Loc --      Pain Edu? --      Excl. in [place]? --     Constitutional: Alert and oriented. Answers questions appropriately.  Chronically ill-appearing, with some difficulty breathing. Cachectic. Eyes: Conjunctivae are normal.  EOMI. No scleral icterus.  No eye discharge. Head: Atraumatic. Nose: No congestion/rhinnorhea. Mouth/Throat: Mucous membranes are dry.  Neck: No stridor.  Supple.  Positive JVD Cardiovascular: Normal rate, regular rhythm. No murmurs, rubs or gallops.  Respiratory: The patient is tachypneic with accessory muscle use but no retractions.  She has poor airflow with a barrel chest, and a prolonged expiratory phase.  She has end expiratory wheezing.  No rales or rhonchi.  She is able to speak in 4-5 word sentences. Gastrointestinal: Soft, nontender and nondistended.  No guarding or rebound.  No peritoneal signs. Musculoskeletal: No LE edema. No ttp in the calves or palpable cords.  Negative Homan's sign. Neurologic:  A&Ox3.  Speech is clear.  Face and smile are symmetric.  EOMI.  Moves all extremities well. Skin:  Skin is warm, dry and intact. No rash noted. Psychiatric: Mood and affect are normal. Speech and behavior are normal.  Normal judgement.  ____________________________________________    LABS (all labs ordered are listed, but only abnormal results are displayed)  Labs Reviewed  BLOOD GAS, VENOUS - Abnormal; Notable for the following components:      Result Value   Bicarbonate 32.8 (*)    Acid-Base Excess 5.4 (*)    All other components within normal limits  BASIC METABOLIC PANEL - Abnormal; Notable for the following components:   Sodium 130 (*)    Chloride 93 (*)    Glucose, Bld 178 (*)    All other components within normal limits  CULTURE, BLOOD (ROUTINE X 2)  CULTURE, BLOOD (ROUTINE X 2)  CBC  LACTIC ACID, PLASMA  LACTIC ACID, PLASMA  TROPONIN I   ____________________________________________  EKG  ED ECG REPORT I, Eula Listen, the attending physician, personally viewed and interpreted this ECG.   Date: 04/08/2018  EKG Time: 1703  Rate: 101  Rhythm: sinus tachycardia  Axis: normal  Intervals:none  ST&T Change: No STEMI  ____________________________________________  RADIOLOGY  Dg Chest 2 View  Result Date: 04/08/2018 CLINICAL DATA:  Shortness of breath. EXAM: CHEST - 2 VIEW COMPARISON:  Chest x-ray dated October 28, 2017. FINDINGS: The heart size and mediastinal contours are within normal limits. Normal pulmonary vascularity. The lungs remain hyperinflated with severe emphysematous changes. Opacities and consolidation in the right middle and lower lobes as well as the lingula have resolved. No focal consolidation, pleural effusion, or pneumothorax. No acute osseous abnormality. IMPRESSION: COPD. No active cardiopulmonary disease. Electronically Signed   By: Titus Dubin M.D.   On: 04/08/2018 17:47    ____________________________________________   PROCEDURES  Procedure(s) performed: None  Procedures  Critical Care performed: No ____________________________________________   INITIAL IMPRESSION / ASSESSMENT AND PLAN / ED COURSE  Pertinent labs & imaging results that were available during my care of the patient were reviewed by me  and considered in my medical decision making (see chart for details).  67 y.o. female with a history of COPD presenting for progressively worsening shortness of breath, wheezing, after being treated with antibiotics for sinusitis.  Overall, the patient is hemodynamically stable and is able to maintain oxygen saturations of 98 to 99% on room air, but she is having significant difficulty with breathing.  We will start with Solu-Medrol and a DuoNeb, and proceed with evaluation for COPD versus viral URI versus pneumonia.  The patient will receive empiric antibiotics.  Her VBG and lactic acid are also pending.  A cardiac cause for the patient's symptoms is much less likely.  There is no evidence of CHF or peripheral edema and the patient does not have a history of this.  Her troponin has been ordered but I do not see ischemic changes on her EKG.  Plan reevaluation for final disposition.  ----------------------------------------- 8:43 PM on 04/08/2018 -----------------------------------------  Patient has a sodium of 130 today, which is a chronic finding for her.  Is not exhibiting any red flag symptoms of hyponatremia.  The patient is feeling significantly better at this time.  She did ambulate and was able to maintain oxygen saturations of 91% or greater.  There is no evidence of pneumonia on the patient's chest x-ray although this is still in the differential and there may be radiographic lag.  Isolated COPD exacerbation or viral URI are still in the differential.  I have had a long discussion with the patient about hospitalization versus discharge.  I have offered her overnight hospitalization in order to continue pulmonary hygiene and start to see the effects of Solu-Medrol.  However, the patient has significant preference to go home.  She will be given azithromycin here, and then continue with a full Z-Pak at home.  She has been instructed to use her albuterol inhaler every 4 hours as needed, which she  has only been using twice a day.  She will also take a 5-day course of prednisone.  She understands follow-up instructions with her PMD and her pulmonologist as well as return precautions.  ____________________________________________  FINAL CLINICAL IMPRESSION(S) / ED DIAGNOSES  Final diagnoses:  COPD exacerbation ([place])  Hyperglycemia         NEW MEDICATIONS STARTED DURING THIS VISIT:  New Prescriptions   AZITHROMYCIN (ZITHROMAX) 250 MG TABLET    Take 2 tablets (500 mg total) by mouth daily for 4 days. Take 1 tablet daily for 3 days.   PREDNISONE (DELTASONE) 20 MG TABLET    Take 3 tablets (60 mg total) by mouth daily.      Mariea Clonts,  Anne-Caroline, MD 04/08/18 2046

## 2018-04-12 ENCOUNTER — Other Ambulatory Visit: Payer: Self-pay | Admitting: Family Medicine

## 2018-04-12 DIAGNOSIS — J301 Allergic rhinitis due to pollen: Secondary | ICD-10-CM

## 2018-04-13 LAB — CULTURE, BLOOD (ROUTINE X 2)
CULTURE: NO GROWTH
Culture: NO GROWTH
SPECIAL REQUESTS: ADEQUATE
Special Requests: ADEQUATE

## 2018-06-21 IMAGING — CR DG CHEST 2V
2 series · 2 of 2 positions shown · non-contrast
Comparison: 02/28/2016.

CLINICAL DATA: Increased shortness of breath and dry mouth for 1
week. RIGHT-sided breast cancer.

EXAM:
CHEST  2 VIEW

[chest pa]
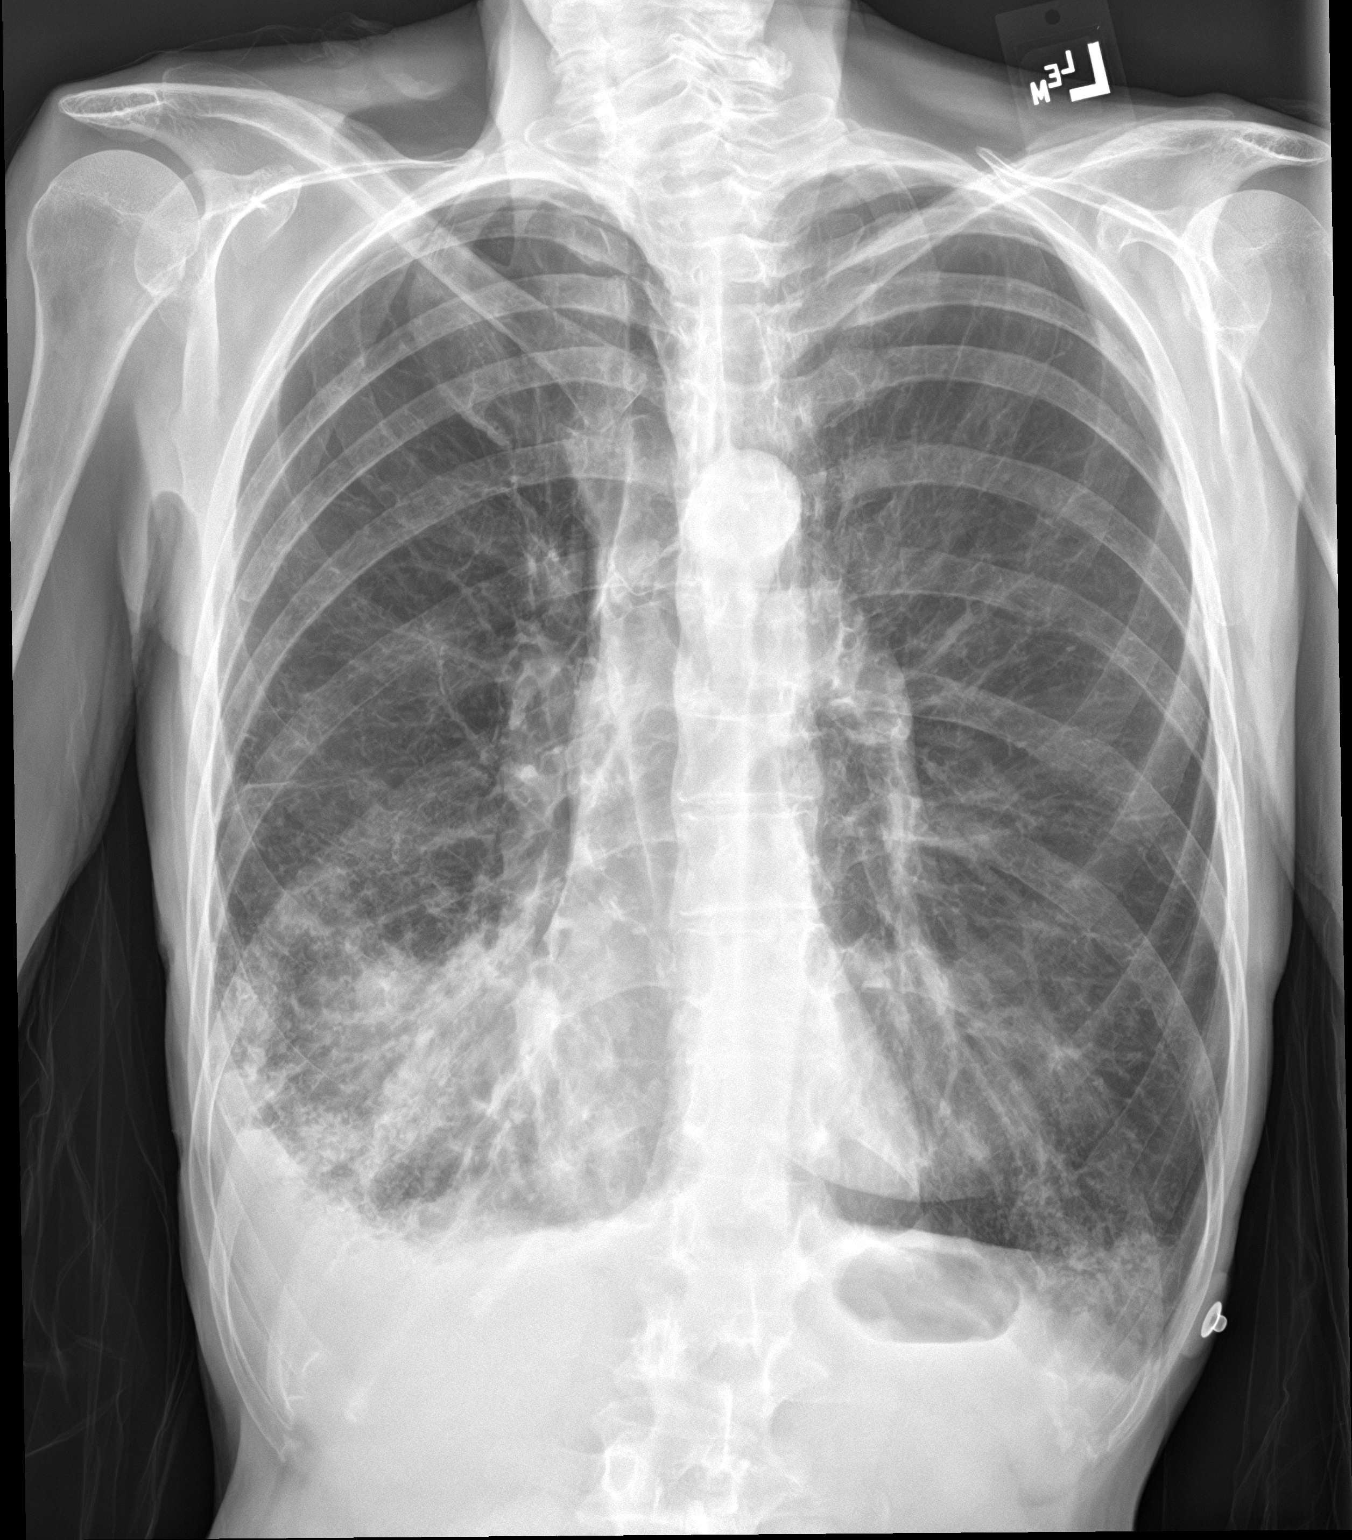

[chest lat]
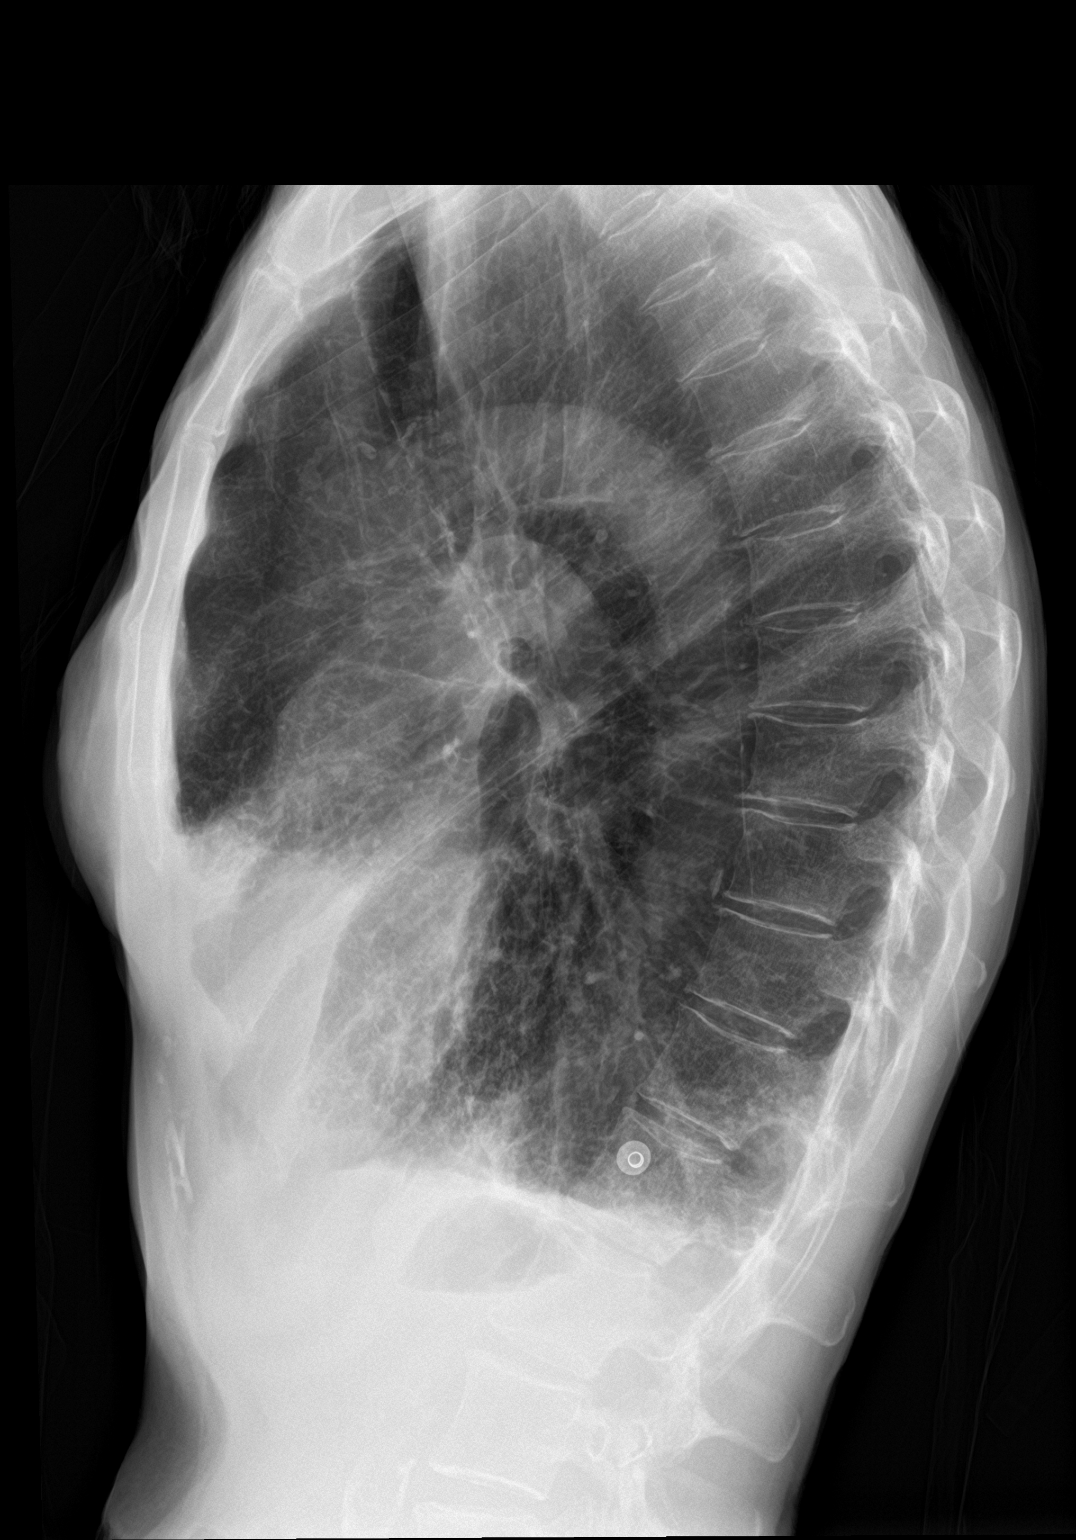

[2 of 2 positions shown; findings below may reference images not displayed]

FINDINGS: Dense consolidation RIGHT middle lobe consistent with acute
pneumonia. Moderate-sized RIGHT pleural effusion. Patchy airspace
opacity on the LEFT laterally at the base could represent
superimposed LEFT lower lobe infiltrate. Small LEFT effusion. Normal
cardiomediastinal silhouette. Hyperinflation. No bony abnormality.
Worsening aeration from priors.
IMPRESSION: Dense consolidation RIGHT middle lobe consistent with acute
pneumonia and associated RIGHT pleural effusion.

Patchy airspace opacity LEFT CP angle could represent small
associated LEFT lower lobe infiltrate.

## 2018-07-20 ENCOUNTER — Other Ambulatory Visit: Payer: Self-pay | Admitting: Family Medicine

## 2018-07-20 DIAGNOSIS — J432 Centrilobular emphysema: Secondary | ICD-10-CM

## 2018-08-18 ENCOUNTER — Encounter (INDEPENDENT_AMBULATORY_CARE_PROVIDER_SITE_OTHER): Payer: PRIVATE HEALTH INSURANCE

## 2018-08-18 ENCOUNTER — Ambulatory Visit (INDEPENDENT_AMBULATORY_CARE_PROVIDER_SITE_OTHER): Payer: PRIVATE HEALTH INSURANCE | Admitting: Vascular Surgery

## 2018-10-06 ENCOUNTER — Other Ambulatory Visit: Payer: Self-pay

## 2018-10-06 DIAGNOSIS — R7303 Prediabetes: Secondary | ICD-10-CM

## 2018-10-06 MED ORDER — METFORMIN HCL 500 MG PO TABS: 500.0000 mg | ORAL_TABLET | Freq: Every day | ORAL | 0 refills | Status: DC

## 2018-10-09 ENCOUNTER — Ambulatory Visit (INDEPENDENT_AMBULATORY_CARE_PROVIDER_SITE_OTHER): Payer: PRIVATE HEALTH INSURANCE | Admitting: Vascular Surgery

## 2018-10-09 ENCOUNTER — Encounter (INDEPENDENT_AMBULATORY_CARE_PROVIDER_SITE_OTHER): Payer: PRIVATE HEALTH INSURANCE

## 2018-10-09 ENCOUNTER — Encounter (INDEPENDENT_AMBULATORY_CARE_PROVIDER_SITE_OTHER): Payer: Self-pay

## 2018-10-10 ENCOUNTER — Ambulatory Visit (INDEPENDENT_AMBULATORY_CARE_PROVIDER_SITE_OTHER): Payer: 59 | Admitting: Family Medicine

## 2018-10-10 ENCOUNTER — Encounter: Payer: Self-pay | Admitting: Family Medicine

## 2018-10-10 VITALS — BP 130/80 | HR 80 | Ht 64.0 in | Wt 73.0 lb

## 2018-10-10 DIAGNOSIS — F17213 Nicotine dependence, cigarettes, with withdrawal: Secondary | ICD-10-CM

## 2018-10-10 DIAGNOSIS — J441 Chronic obstructive pulmonary disease with (acute) exacerbation: Secondary | ICD-10-CM

## 2018-10-10 DIAGNOSIS — Z23 Encounter for immunization: Secondary | ICD-10-CM

## 2018-10-10 DIAGNOSIS — J432 Centrilobular emphysema: Secondary | ICD-10-CM

## 2018-10-10 DIAGNOSIS — I10 Essential (primary) hypertension: Secondary | ICD-10-CM | POA: Diagnosis not present

## 2018-10-10 DIAGNOSIS — I700 Atherosclerosis of aorta: Secondary | ICD-10-CM

## 2018-10-10 DIAGNOSIS — R7303 Prediabetes: Secondary | ICD-10-CM | POA: Diagnosis not present

## 2018-10-10 DIAGNOSIS — R636 Underweight: Secondary | ICD-10-CM

## 2018-10-10 DIAGNOSIS — R9389 Abnormal findings on diagnostic imaging of other specified body structures: Secondary | ICD-10-CM

## 2018-10-10 MED ORDER — PREDNISONE 20 MG PO TABS: 60.0000 mg | ORAL_TABLET | Freq: Every day | ORAL | 0 refills | Status: DC

## 2018-10-10 MED ORDER — ALBUTEROL SULFATE HFA 108 (90 BASE) MCG/ACT IN AERS: 2.0000 | INHALATION_SPRAY | Freq: Four times a day (QID) | RESPIRATORY_TRACT | 11 refills | Status: DC | PRN

## 2018-10-10 MED ORDER — LISINOPRIL 10 MG PO TABS: 10.0000 mg | ORAL_TABLET | Freq: Every day | ORAL | 1 refills | Status: DC

## 2018-10-10 MED ORDER — METFORMIN HCL 500 MG PO TABS: 500.0000 mg | ORAL_TABLET | Freq: Every day | ORAL | 0 refills | Status: DC

## 2018-10-10 NOTE — Progress Notes (Signed)
Date:  10/10/2018   Name:  Lindsey Nguyen   DOB:  [birth year]   MRN:  [id number]   Chief Complaint: Hypertension; Diabetes (A1C and micro); and Flu Vaccine  Hypertension  This is a chronic problem. The current episode started more than 1 year ago. The problem has been waxing and waning since onset. The problem is controlled. Pertinent negatives include no anxiety, blurred vision, chest pain, headaches, malaise/fatigue, neck pain, orthopnea, palpitations, peripheral edema, PND, shortness of breath or sweats. There are no associated agents to hypertension. Risk factors for coronary artery disease include diabetes mellitus. Past treatments include ACE inhibitors. The current treatment provides mild improvement. There are no compliance problems.  There is no history of angina, kidney disease, CAD/MI, CVA, heart failure, left ventricular hypertrophy, PVD or retinopathy. There is no history of chronic renal disease, a hypertension causing med or renovascular disease.  Diabetes  She has type 2 diabetes mellitus. Her disease course has been stable. Pertinent negatives for hypoglycemia include no confusion, dizziness, headaches, hunger, mood changes, nervousness/anxiousness, pallor, sleepiness, speech difficulty, sweats or tremors. Pertinent negatives for diabetes include no blurred vision, no chest pain, no fatigue, no foot paresthesias, no foot ulcerations, no polydipsia, no polyphagia, no polyuria, no visual change, no weakness and no weight loss. Symptoms are stable. Pertinent negatives for diabetic complications include no CVA, PVD or retinopathy. Current diabetic treatment includes oral agent (monotherapy). Home blood sugar record trend: pt does not monitor. An ACE inhibitor/angiotensin II receptor blocker is being taken.    Review of Systems  Constitutional: Negative.  Negative for chills, fatigue, fever, malaise/fatigue, unexpected weight change and weight loss.  HENT: Negative for congestion, ear  discharge, ear pain, rhinorrhea, sinus pressure, sneezing and sore throat.   Eyes: Negative for blurred vision, photophobia, pain, discharge, redness and itching.  Respiratory: Negative for cough, shortness of breath, wheezing and stridor.   Cardiovascular: Negative for chest pain, palpitations, orthopnea and PND.  Gastrointestinal: Negative for abdominal pain, blood in stool, constipation, diarrhea, nausea and vomiting.  Endocrine: Negative for cold intolerance, heat intolerance, polydipsia, polyphagia and polyuria.  Genitourinary: Negative for dysuria, flank pain, frequency, hematuria, menstrual problem, pelvic pain, urgency, vaginal bleeding and vaginal discharge.  Musculoskeletal: Negative for arthralgias, back pain, myalgias and neck pain.  Skin: Negative for pallor and rash.  Allergic/Immunologic: Negative for environmental allergies and food allergies.  Neurological: Negative for dizziness, tremors, speech difficulty, weakness, light-headedness, numbness and headaches.  Hematological: Negative for adenopathy. Does not bruise/bleed easily.  Psychiatric/Behavioral: Negative for confusion and dysphoric mood. The patient is not nervous/anxious.     Patient Active Problem List   Diagnosis Date Noted  . Acute pansinusitis 04/02/2018  . Chronic bronchitis with acute exacerbation ([place]) 04/02/2018  . Acute on chronic respiratory failure with hypoxia ([place]) 10/28/2017  . Diabetes mellitus without complication ([place]) 28/63/8177  . Aortic atherosclerosis ([place]) 05/08/2016  . Allergic rhinitis due to pollen 05/08/2016  . Excessive weight loss 02/28/2016  . Essential hypertension 05/19/2015  . Prediabetes 05/19/2015  . Osteoporosis 05/19/2015  . COLD (chronic obstructive lung disease) ([place]) 05/19/2015    Allergies  Allergen Reactions  . Tetanus Toxoids Swelling    Past Surgical History:  Procedure Laterality Date  . BREAST LUMPECTOMY     x 2  . BUNIONECTOMY Right   . COLONOSCOPY      normal  . PERIPHERAL VASCULAR CATHETERIZATION N/A 04/10/2016   Procedure: Lower Extremity Angiography;  Surgeon: Katha Cabal, MD;  Location: [facility] INVASIVE CV  LAB;  Service: Cardiovascular;  Laterality: N/A;  . PERIPHERAL VASCULAR CATHETERIZATION  04/10/2016   Procedure: Lower Extremity Intervention;  Surgeon: Katha Cabal, MD;  Location: [place] CV LAB;  Service: Cardiovascular;;  . TUBAL LIGATION      Social History   Tobacco Use  . Smoking status: Current Every Day Smoker    Packs/day: 1.00    Years: 50.00    Pack years: 50.00    Types: Cigarettes  . Smokeless tobacco: Never Used  Substance Use Topics  . Alcohol use: Yes    Alcohol/week: 21.0 standard drinks    Types: 14 Glasses of wine, 7 Cans of beer per week  . Drug use: No     Medication list has been reviewed and updated.  Current Meds  Medication Sig  . albuterol (PROVENTIL HFA;VENTOLIN HFA) 108 (90 Base) MCG/ACT inhaler Inhale 2 puffs into the lungs every 6 (six) hours as needed for wheezing or shortness of breath.  Marland Kitchen BREO ELLIPTA 200-25 MCG/INH AEPB TAKE 1 PUFF BY MOUTH EVERY DAY  . lisinopril (PRINIVIL,ZESTRIL) 10 MG tablet Take 1 tablet (10 mg total) by mouth daily.  Marland Kitchen loratadine (CLARITIN) 10 MG tablet Take 10 mg by mouth daily.  . metFORMIN (GLUCOPHAGE) 500 MG tablet Take 1 tablet (500 mg total) by mouth daily.    PHQ 2/9 Scores 07/23/2017 11/14/2015 05/19/2015  PHQ - 2 Score 0 1 0  PHQ- 9 Score 0 - -    Physical Exam Vitals signs and nursing note reviewed.  Constitutional:      General: She is not in acute distress.    Appearance: She is not diaphoretic.  HENT:     Head: Normocephalic and atraumatic.     Right Ear: External ear normal.     Left Ear: External ear normal.     Nose: Nose normal.  Eyes:     General:        Right eye: No discharge.        Left eye: No discharge.     Conjunctiva/sclera: Conjunctivae normal.     Pupils: Pupils are equal, round, and reactive to light.    Neck:     Musculoskeletal: Normal range of motion and neck supple.     Thyroid: No thyromegaly.     Vascular: No JVD.  Cardiovascular:     Rate and Rhythm: Normal rate and regular rhythm.     Heart sounds: Normal heart sounds. No murmur. No friction rub. No gallop.   Pulmonary:     Effort: Pulmonary effort is normal.     Breath sounds: Normal breath sounds.  Abdominal:     General: Bowel sounds are normal.     Palpations: Abdomen is soft. There is no mass.     Tenderness: There is no abdominal tenderness. There is no guarding.  Musculoskeletal: Normal range of motion.  Lymphadenopathy:     Cervical: No cervical adenopathy.  Skin:    General: Skin is warm and dry.  Neurological:     Mental Status: She is alert.     Deep Tendon Reflexes: Reflexes are normal and symmetric.     BP 130/80   Pulse 80   Ht 5\' 4"  (1.626 m)   Wt 73 lb (33.1 kg)   BMI 12.53 kg/m   Assessment and Plan:  1. Prediabetes Has history of prediabetes episodes of hypo-or hyperglycemia check A1c for albuminuria and renal function panel continue metformin 500 mg twice a day - Hemoglobin A1c - Microalbumin /  creatinine urine ratio - Renal Function Panel - metFORMIN (GLUCOPHAGE) 500 MG tablet; Take 1 tablet (500 mg total) by mouth daily.  Dispense: 90 tablet; Refill: 0  2. Cigarette nicotine dependence with withdrawal And has severe emphysema has been unable to smoking.Patient has been advised of the health risks of smoking and counseled concerning cessation of tobacco products. I spent over 3 minutes for discussion and to answer questions.  3. Centrilobular emphysema ([place]) Dear centrilobular emphysema refill albuterol 2 puffs every 6 hours.  Is also a to continue [place] daily collection. - albuterol (PROVENTIL HFA;VENTOLIN HFA) 108 (90 Base) MCG/ACT inhaler; Inhale 2 puffs into the lungs every 6 (six) hours as needed for wheezing or shortness of breath.  Dispense: 1 Inhaler; Refill: 11  4. Essential  hypertension Chronic.  Controlled.  Will check renal function panel.  Continue lisinopril. - Renal Function Panel  5. Aortic atherosclerosis ([place]) Patient has aortic atherosclerosis noted on CT scan dose aspirin suggested.  6. Abnormal chest CT She has a history of abnormal CT.  Noted in the right lower lobe are reticular changes and a confluent consolidation of the right middle lobe we will repeat CT to determine any progression.  7. Chronic obstructive pulmonary disease with acute exacerbation (HCC) Severe emphysema will be further evaluation with CT scan. - CT Chest Wo Contrast; Future  8. Severely underweight adult Patient is severely underweight BMI of 12.5 patient has been discussed with her in the past suspect her primary concern is nicotine dependency.  9. Influenza vaccine needed Discussed and administered - Flu vaccine HIGH DOSE PF

## 2018-10-12 ENCOUNTER — Other Ambulatory Visit: Payer: Self-pay | Admitting: Family Medicine

## 2018-10-12 DIAGNOSIS — J432 Centrilobular emphysema: Secondary | ICD-10-CM

## 2018-10-12 LAB — RENAL FUNCTION PANEL
ALBUMIN: 5.2 g/dL — AB (ref 3.6–4.8)
BUN / CREAT RATIO: 33 — AB (ref 12–28)
BUN: 23 mg/dL (ref 8–27)
CALCIUM: 10.7 mg/dL — AB (ref 8.7–10.3)
CHLORIDE: 91 mmol/L — AB (ref 96–106)
CO2: 20 mmol/L (ref 20–29)
Creatinine, Ser: 0.7 mg/dL (ref 0.57–1.00)
GFR calc Af Amer: 104 mL/min/{1.73_m2} (ref >59)
GFR calc non Af Amer: 91 mL/min/{1.73_m2} (ref >59)
Glucose: 99 mg/dL (ref 65–99)
POTASSIUM: 4.9 mmol/L (ref 3.5–5.2)
Phosphorus: 4.5 mg/dL (ref 2.5–4.5)
Sodium: 134 mmol/L (ref 134–144)

## 2018-10-12 LAB — MICROALBUMIN / CREATININE URINE RATIO
Creatinine, Urine: 27.7 mg/dL
Microalb/Creat Ratio: 148.4 mg/g creat — ABNORMAL HIGH (ref 0.0–30.0)
Microalbumin, Urine: 41.1 ug/mL

## 2018-10-12 LAB — HEMOGLOBIN A1C
Est. average glucose Bld gHb Est-mCnc: 123 mg/dL
Hgb A1c MFr Bld: 5.9 % — ABNORMAL HIGH (ref 4.8–5.6)

## 2018-10-13 ENCOUNTER — Other Ambulatory Visit: Payer: Self-pay

## 2018-10-13 DIAGNOSIS — R7303 Prediabetes: Secondary | ICD-10-CM

## 2018-10-13 MED ORDER — LISINOPRIL 10 MG PO TABS: 10.0000 mg | ORAL_TABLET | Freq: Every day | ORAL | 1 refills | Status: DC

## 2018-10-13 MED ORDER — LORATADINE 10 MG PO TABS: 10.0000 mg | ORAL_TABLET | Freq: Every day | ORAL | 1 refills | Status: DC

## 2018-10-15 ENCOUNTER — Ambulatory Visit: Payer: 59

## 2018-10-17 ENCOUNTER — Other Ambulatory Visit: Payer: Self-pay | Admitting: Family Medicine

## 2018-10-17 ENCOUNTER — Ambulatory Visit
Admission: RE | Admit: 2018-10-17 | Discharge: 2018-10-17 | Disposition: A | Discharge status: Home / Self Care | Payer: 59 | Source: Ambulatory Visit | Attending: Family Medicine | Admitting: Family Medicine

## 2018-10-17 DIAGNOSIS — J441 Chronic obstructive pulmonary disease with (acute) exacerbation: Secondary | ICD-10-CM | POA: Diagnosis not present

## 2018-10-17 DIAGNOSIS — R9389 Abnormal findings on diagnostic imaging of other specified body structures: Secondary | ICD-10-CM

## 2018-10-17 NOTE — Progress Notes (Unsigned)
looed at abnormal CT

## 2018-10-23 ENCOUNTER — Encounter: Payer: Self-pay | Admitting: Family Medicine

## 2018-10-23 ENCOUNTER — Ambulatory Visit (INDEPENDENT_AMBULATORY_CARE_PROVIDER_SITE_OTHER): Payer: 59 | Admitting: Family Medicine

## 2018-10-23 VITALS — BP 130/80 | HR 72 | Ht 64.0 in | Wt 72.0 lb

## 2018-10-23 DIAGNOSIS — J432 Centrilobular emphysema: Secondary | ICD-10-CM | POA: Diagnosis not present

## 2018-10-23 DIAGNOSIS — R9389 Abnormal findings on diagnostic imaging of other specified body structures: Secondary | ICD-10-CM | POA: Diagnosis not present

## 2018-10-23 NOTE — Progress Notes (Signed)
Date:  10/23/2018   Name:  Lindsey Nguyen   DOB:  [birth year]   MRN:  [id number]   Chief Complaint: CT results consultation and COPD  Patient followup for abnormal ct scan.patient has severe copd.   Review of Systems  Constitutional: Negative.  Negative for chills, fatigue, fever and unexpected weight change.  HENT: Negative for congestion, ear discharge, ear pain, rhinorrhea, sinus pressure, sneezing and sore throat.   Eyes: Negative for photophobia, pain, discharge, redness and itching.  Respiratory: Negative for cough, shortness of breath, wheezing and stridor.   Gastrointestinal: Negative for abdominal pain, blood in stool, constipation, diarrhea, nausea and vomiting.  Endocrine: Negative for cold intolerance, heat intolerance, polydipsia, polyphagia and polyuria.  Genitourinary: Negative for dysuria, flank pain, frequency, hematuria, menstrual problem, pelvic pain, urgency, vaginal bleeding and vaginal discharge.  Musculoskeletal: Negative for arthralgias, back pain and myalgias.  Skin: Negative for rash.  Allergic/Immunologic: Negative for environmental allergies and food allergies.  Neurological: Negative for dizziness, weakness, light-headedness, numbness and headaches.  Hematological: Negative for adenopathy. Does not bruise/bleed easily.  Psychiatric/Behavioral: Negative for dysphoric mood. The patient is not nervous/anxious.     Patient Active Problem List   Diagnosis Date Noted  . Acute pansinusitis 04/02/2018  . Chronic bronchitis with acute exacerbation ([place]) 04/02/2018  . Acute on chronic respiratory failure with hypoxia ([place]) 10/28/2017  . Diabetes mellitus without complication ([place]) 85/63/1497  . Aortic atherosclerosis ([place]) 05/08/2016  . Allergic rhinitis due to pollen 05/08/2016  . Excessive weight loss 02/28/2016  . Essential hypertension 05/19/2015  . Prediabetes 05/19/2015  . Osteoporosis 05/19/2015  . COLD (chronic obstructive lung disease) ([place])  05/19/2015    Allergies  Allergen Reactions  . Tetanus Toxoids Swelling    Past Surgical History:  Procedure Laterality Date  . BREAST LUMPECTOMY     x 2  . BUNIONECTOMY Right   . COLONOSCOPY     normal  . PERIPHERAL VASCULAR CATHETERIZATION N/A 04/10/2016   Procedure: Lower Extremity Angiography;  Surgeon: Katha Cabal, MD;  Location: [facility];  Service: Cardiovascular;  Laterality: N/A;  . PERIPHERAL VASCULAR CATHETERIZATION  04/10/2016   Procedure: Lower Extremity Intervention;  Surgeon: Katha Cabal, MD;  Location: [facility];  Service: Cardiovascular;;  . TUBAL LIGATION      Social History   Tobacco Use  . Smoking status: Current Every Day Smoker    Packs/day: 1.00    Years: 50.00    Pack years: 50.00    Types: Cigarettes  . Smokeless tobacco: Never Used  Substance Use Topics  . Alcohol use: Yes    Alcohol/week: 21.0 standard drinks    Types: 14 Glasses of wine, 7 Cans of beer per week  . Drug use: No     Medication list has been reviewed and updated.  Current Meds  Medication Sig  . albuterol (PROVENTIL HFA;VENTOLIN HFA) 108 (90 Base) MCG/ACT inhaler Inhale 2 puffs into the lungs every 6 (six) hours as needed for wheezing or shortness of breath.  Marland Kitchen BREO ELLIPTA 200-25 MCG/INH AEPB TAKE 1 PUFF BY MOUTH EVERY DAY  . lisinopril (PRINIVIL,ZESTRIL) 10 MG tablet Take 1 tablet (10 mg total) by mouth daily.  Marland Kitchen loratadine (CLARITIN) 10 MG tablet Take 1 tablet (10 mg total) by mouth daily.  . metFORMIN (GLUCOPHAGE) 500 MG tablet Take 1 tablet (500 mg total) by mouth daily.    PHQ 2/9 Scores 07/23/2017 11/14/2015 05/19/2015  PHQ - 2 Score 0 1 0  PHQ- 9 Score 0 - -    Physical Exam Vitals signs and nursing note reviewed.  Constitutional:      General: She is not in acute distress.    Appearance: She is not diaphoretic.  HENT:     Head: Normocephalic and atraumatic.     Right Ear: External ear normal.     Left Ear: External ear  normal.     Nose: Nose normal.  Eyes:     General:        Right eye: No discharge.        Left eye: No discharge.     Conjunctiva/sclera: Conjunctivae normal.     Pupils: Pupils are equal, round, and reactive to light.  Neck:     Musculoskeletal: Normal range of motion and neck supple.     Thyroid: No thyromegaly.     Vascular: No JVD.  Cardiovascular:     Rate and Rhythm: Normal rate and regular rhythm.     Heart sounds: Normal heart sounds. No murmur. No friction rub. No gallop.   Pulmonary:     Effort: Pulmonary effort is normal.     Breath sounds: Normal breath sounds.  Abdominal:     General: Bowel sounds are normal.     Palpations: Abdomen is soft. There is no mass.     Tenderness: There is no abdominal tenderness. There is no guarding.  Musculoskeletal: Normal range of motion.  Lymphadenopathy:     Cervical: No cervical adenopathy.  Neurological:     Mental Status: She is alert.     BP 130/80   Pulse 72   Ht 5\' 4"  (1.626 m)   Wt 72 lb (32.7 kg)   BMI 12.36 kg/m   Assessment and Plan: 1. Abnormal CT of the chest Patient with repeat of her chest CT was noted 2 nodules and 1 of concern has a spiculated appearance.  Discussion with Dr. Raul Del notes that he has concerns about the appearance as well and we discussed with Dr. Donella Stade concerning need for other evaluation.  She was given options 1 doing nothing, to waiting 3 months and repeating CT scan, 3 consult with Dr. Donella Stade concerning further evaluation i.e. PET scan and/or proceeding with intervention.  Patient is to about this over the weekend and call after to give [place] her direction that she would like to go.  2. Centrilobular emphysema ([place]) Mitigating circumstances and that she has severe emphysema which would not allow any surgical surgical approach.  Patient fully understands this and agrees.  Of course I suggested that she discontinue smoking.  Patient is to continue her current inhalation therapies.

## 2018-10-27 ENCOUNTER — Ambulatory Visit: Payer: Self-pay | Admitting: Family Medicine

## 2018-11-24 ENCOUNTER — Other Ambulatory Visit: Payer: Self-pay | Admitting: Family Medicine

## 2018-11-24 DIAGNOSIS — I10 Essential (primary) hypertension: Secondary | ICD-10-CM

## 2018-12-28 ENCOUNTER — Other Ambulatory Visit: Payer: Self-pay | Admitting: Family Medicine

## 2018-12-28 DIAGNOSIS — J432 Centrilobular emphysema: Secondary | ICD-10-CM

## 2019-03-24 ENCOUNTER — Other Ambulatory Visit: Payer: Self-pay | Admitting: Family Medicine

## 2019-03-27 ENCOUNTER — Other Ambulatory Visit: Payer: Self-pay | Admitting: Family Medicine

## 2019-03-27 DIAGNOSIS — R7303 Prediabetes: Secondary | ICD-10-CM

## 2019-03-27 DIAGNOSIS — J432 Centrilobular emphysema: Secondary | ICD-10-CM

## 2019-04-29 ENCOUNTER — Other Ambulatory Visit: Payer: Self-pay | Admitting: Family Medicine

## 2019-04-29 DIAGNOSIS — R7303 Prediabetes: Secondary | ICD-10-CM

## 2019-04-30 ENCOUNTER — Encounter: Payer: Self-pay | Admitting: Family Medicine

## 2019-04-30 ENCOUNTER — Ambulatory Visit (INDEPENDENT_AMBULATORY_CARE_PROVIDER_SITE_OTHER): Payer: 59 | Admitting: Family Medicine

## 2019-04-30 ENCOUNTER — Other Ambulatory Visit: Payer: Self-pay

## 2019-04-30 DIAGNOSIS — J301 Allergic rhinitis due to pollen: Secondary | ICD-10-CM

## 2019-04-30 DIAGNOSIS — J432 Centrilobular emphysema: Secondary | ICD-10-CM

## 2019-04-30 DIAGNOSIS — I10 Essential (primary) hypertension: Secondary | ICD-10-CM

## 2019-04-30 DIAGNOSIS — R7303 Prediabetes: Secondary | ICD-10-CM

## 2019-04-30 MED ORDER — BREO ELLIPTA 200-25 MCG/INH IN AEPB: 1.0000 | INHALATION_SPRAY | Freq: Every day | RESPIRATORY_TRACT | 1 refills | Status: DC

## 2019-04-30 MED ORDER — LORATADINE 10 MG PO TABS: 10.0000 mg | ORAL_TABLET | Freq: Every day | ORAL | 1 refills | Status: DC

## 2019-04-30 MED ORDER — LISINOPRIL 10 MG PO TABS: 10.0000 mg | ORAL_TABLET | Freq: Every day | ORAL | 1 refills | Status: DC

## 2019-04-30 MED ORDER — METFORMIN HCL 500 MG PO TABS: 500.0000 mg | ORAL_TABLET | Freq: Every day | ORAL | 1 refills | Status: DC

## 2019-04-30 NOTE — Progress Notes (Signed)
Date:  04/30/2019   Name:  Lindsey Nguyen   DOB:  October 10, 1950   MRN:  [id number]   Chief Complaint: Hypertension, COPD, Diabetes, and Allergic Rhinitis   I connected withthis patient, 28, by telephoneat the patient's home.  I verified that I am speaking with the correct person using two identifiers. This visit was conducted via telephone due to the Covid-19 outbreak from my office at [facility] in [place], [place]. I discussed the limitations, risks, security and privacy concerns of performing an evaluation and management service by telephone. I also discussed with the patient that there may be a patient responsible charge related to this service. The patient expressed understanding and agreed to proceed.  Hypertension This is a chronic problem. The current episode started more than 1 year ago. The problem is unchanged. The problem is controlled. Pertinent negatives include no anxiety, blurred vision, chest pain, headaches, malaise/fatigue, neck pain, orthopnea, palpitations, peripheral edema, PND, shortness of breath or sweats. There are no associated agents to hypertension. Risk factors for coronary artery disease include smoking/tobacco exposure. Past treatments include ACE inhibitors. The current treatment provides moderate improvement. There are no compliance problems.  There is no history of angina, kidney disease, CAD/MI, CVA, heart failure, left ventricular hypertrophy, PVD or retinopathy. There is no history of chronic renal disease, a hypertension causing med or renovascular disease.  COPD She complains of wheezing. There is no chest tightness, cough, difficulty breathing, frequent throat clearing, hemoptysis, hoarse voice, shortness of breath or sputum production. This is a new problem. The current episode started more than 1 year ago. Pertinent negatives include no appetite change, chest pain, dyspnea on exertion, ear congestion, ear pain, fever, headaches, heartburn,  malaise/fatigue, myalgias, nasal congestion, orthopnea, PND, postnasal drip, rhinorrhea, sneezing, sore throat, sweats, trouble swallowing or weight loss. Her symptoms are aggravated by pollen. Her symptoms are alleviated by beta-agonist and steroid inhaler. She reports moderate improvement on treatment. Her past medical history is significant for COPD.  Diabetes She presents for her follow-up diabetic visit. She has type 2 diabetes mellitus. Her disease course has been stable. Pertinent negatives for hypoglycemia include no confusion, dizziness, headaches, hunger, mood changes, nervousness/anxiousness, pallor, seizures, sleepiness, speech difficulty, sweats or tremors. Pertinent negatives for diabetes include no blurred vision, no chest pain, no fatigue, no foot paresthesias, no foot ulcerations, no polydipsia, no polyphagia, no polyuria, no visual change, no weakness and no weight loss. There are no hypoglycemic complications. Symptoms are stable. Pertinent negatives for diabetic complications include no CVA, heart disease, PVD or retinopathy. She is following a generally healthy diet. Meal planning includes avoidance of concentrated sweets, carbohydrate counting and calorie counting. Home blood sugar record trend: does not check. An ACE inhibitor/angiotensin II receptor blocker is being taken. She does not see a podiatrist.Eye exam is not current.    Review of Systems  Constitutional: Negative.  Negative for appetite change, chills, fatigue, fever, malaise/fatigue, unexpected weight change and weight loss.  HENT: Negative for congestion, ear discharge, ear pain, hoarse voice, postnasal drip, rhinorrhea, sinus pressure, sneezing, sore throat and trouble swallowing.   Eyes: Negative for blurred vision, photophobia, pain, discharge, redness and itching.  Respiratory: Positive for wheezing. Negative for cough, hemoptysis, sputum production, shortness of breath and stridor.   Cardiovascular: Negative for  chest pain, dyspnea on exertion, palpitations, orthopnea and PND.  Gastrointestinal: Negative for abdominal pain, blood in stool, constipation, diarrhea, heartburn, nausea and vomiting.  Endocrine: Negative for cold intolerance, heat intolerance, polydipsia, polyphagia  and polyuria.  Genitourinary: Negative for dysuria, flank pain, frequency, hematuria, menstrual problem, pelvic pain, urgency, vaginal bleeding and vaginal discharge.  Musculoskeletal: Negative for arthralgias, back pain, myalgias and neck pain.  Skin: Negative for pallor and rash.  Allergic/Immunologic: Negative for environmental allergies and food allergies.  Neurological: Negative for dizziness, tremors, seizures, speech difficulty, weakness, light-headedness, numbness and headaches.  Hematological: Negative for adenopathy. Does not bruise/bleed easily.  Psychiatric/Behavioral: Negative for confusion and dysphoric mood. The patient is not nervous/anxious.     Patient Active Problem List   Diagnosis Date Noted  . Acute pansinusitis 04/02/2018  . Chronic bronchitis with acute exacerbation ([place]) 04/02/2018  . Acute on chronic respiratory failure with hypoxia ([place]) 10/28/2017  . Diabetes mellitus without complication ([place]) 86/57/8469  . Aortic atherosclerosis ([place]) 05/08/2016  . Allergic rhinitis due to pollen 05/08/2016  . Excessive weight loss 02/28/2016  . Essential hypertension 05/19/2015  . Prediabetes 05/19/2015  . Osteoporosis 05/19/2015  . COLD (chronic obstructive lung disease) ([place]) 05/19/2015    Allergies  Allergen Reactions  . Tetanus Toxoids Swelling    Past Surgical History:  Procedure Laterality Date  . BREAST LUMPECTOMY     x 2  . BUNIONECTOMY Right   . COLONOSCOPY     normal  . PERIPHERAL VASCULAR CATHETERIZATION N/A 04/10/2016   Procedure: Lower Extremity Angiography;  Surgeon: Katha Cabal, MD;  Location: [facility];  Service: Cardiovascular;  Laterality: N/A;  . PERIPHERAL  VASCULAR CATHETERIZATION  04/10/2016   Procedure: Lower Extremity Intervention;  Surgeon: Katha Cabal, MD;  Location: [facility];  Service: Cardiovascular;;  . TUBAL LIGATION      Social History   Tobacco Use  . Smoking status: Current Every Day Smoker    Packs/day: 1.00    Years: 50.00    Pack years: 50.00    Types: Cigarettes  . Smokeless tobacco: Never Used  Substance Use Topics  . Alcohol use: Yes    Alcohol/week: 21.0 standard drinks    Types: 14 Glasses of wine, 7 Cans of beer per week  . Drug use: No     Medication list has been reviewed and updated.  Current Meds  Medication Sig  . albuterol (PROVENTIL HFA;VENTOLIN HFA) 108 (90 Base) MCG/ACT inhaler Inhale 2 puffs into the lungs every 6 (six) hours as needed for wheezing or shortness of breath.  Marland Kitchen BREO ELLIPTA 200-25 MCG/INH AEPB INHALE 1 PUFF BY MOUTH EVERY DAY  . lisinopril (ZESTRIL) 10 MG tablet TAKE 1 TABLET BY MOUTH EVERY DAY  . loratadine (CLARITIN) 10 MG tablet TAKE 1 TABLET BY MOUTH EVERY DAY  . metFORMIN (GLUCOPHAGE) 500 MG tablet TAKE 1 TABLET (500 MG TOTAL) BY MOUTH DAILY.  Marland Kitchen tiotropium (SPIRIVA HANDIHALER) 18 MCG inhalation capsule pulm    PHQ 2/9 Scores 04/30/2019 07/23/2017 11/14/2015 05/19/2015  PHQ - 2 Score 0 0 1 0  PHQ- 9 Score 0 0 - -    BP Readings from Last 3 Encounters:  10/23/18 130/80  10/10/18 130/80  04/08/18 (!) 136/95    Physical Exam Pulmonary:     Effort: Pulmonary effort is normal.     Breath sounds: Normal breath sounds and air entry. No stridor.  Neurological:     Mental Status: She is alert.     Wt Readings from Last 3 Encounters:  10/23/18 72 lb (32.7 kg)  10/10/18 73 lb (33.1 kg)  04/08/18 71 lb (32.2 kg)    There were no vitals taken for this  visit. This was a virtual visit that was done during the COVID situation because of the risk of patient exposure.  Assessment and Plan: 1. Centrilobular emphysema (HCC) Chronic.  Controlled.  Continue Breo  200-25 1 puff daily. - fluticasone furoate-vilanterol (BREO ELLIPTA) 200-25 MCG/INH AEPB; Inhale 1 puff into the lungs daily.  Dispense: 90 each; Refill: 1  2. Prediabetes Chronic.  Controlled.  Unable to do labs today and therefore we will continue metformin 500 mg twice a day.  Will recheck in 4 months. - metFORMIN (GLUCOPHAGE) 500 MG tablet; Take 1 tablet (500 mg total) by mouth daily.  Dispense: 90 tablet; Refill: 1  3. Essential hypertension Chronic.  Controlled.  Continue lisinopril 10 mg once a day. - lisinopril (ZESTRIL) 10 MG tablet; Take 1 tablet (10 mg total) by mouth daily.  Dispense: 90 tablet; Refill: 1  4. Seasonal allergic rhinitis due to pollen We will continue loratadine 10 mg for seasonal rhinitis. - loratadine (CLARITIN) 10 MG tablet; Take 1 tablet (10 mg total) by mouth daily.  Dispense: 90 tablet; Refill: 1

## 2019-07-04 ENCOUNTER — Other Ambulatory Visit: Payer: Self-pay

## 2019-07-27 ENCOUNTER — Emergency Department: Payer: 59

## 2019-07-27 ENCOUNTER — Inpatient Hospital Stay
Admission: EM | Admit: 2019-07-27 | Discharge: 2019-07-29 | DRG: 190 | Disposition: A | Discharge status: Home Health | Payer: 59 | Attending: Internal Medicine | Admitting: Internal Medicine

## 2019-07-27 ENCOUNTER — Encounter: Payer: Self-pay | Admitting: Emergency Medicine

## 2019-07-27 ENCOUNTER — Other Ambulatory Visit: Payer: Self-pay

## 2019-07-27 DIAGNOSIS — Z79899 Other long term (current) drug therapy: Secondary | ICD-10-CM

## 2019-07-27 DIAGNOSIS — E43 Unspecified severe protein-calorie malnutrition: Secondary | ICD-10-CM | POA: Insufficient documentation

## 2019-07-27 DIAGNOSIS — Z681 Body mass index (BMI) 19 or less, adult: Secondary | ICD-10-CM

## 2019-07-27 DIAGNOSIS — J441 Chronic obstructive pulmonary disease with (acute) exacerbation: Secondary | ICD-10-CM | POA: Diagnosis not present

## 2019-07-27 DIAGNOSIS — Z7984 Long term (current) use of oral hypoglycemic drugs: Secondary | ICD-10-CM | POA: Diagnosis not present

## 2019-07-27 DIAGNOSIS — Z7951 Long term (current) use of inhaled steroids: Secondary | ICD-10-CM

## 2019-07-27 DIAGNOSIS — M810 Age-related osteoporosis without current pathological fracture: Secondary | ICD-10-CM | POA: Diagnosis present

## 2019-07-27 DIAGNOSIS — I10 Essential (primary) hypertension: Secondary | ICD-10-CM | POA: Diagnosis present

## 2019-07-27 DIAGNOSIS — F17210 Nicotine dependence, cigarettes, uncomplicated: Secondary | ICD-10-CM | POA: Diagnosis present

## 2019-07-27 DIAGNOSIS — Z23 Encounter for immunization: Secondary | ICD-10-CM | POA: Diagnosis not present

## 2019-07-27 DIAGNOSIS — Z853 Personal history of malignant neoplasm of breast: Secondary | ICD-10-CM | POA: Diagnosis not present

## 2019-07-27 DIAGNOSIS — I248 Other forms of acute ischemic heart disease: Secondary | ICD-10-CM | POA: Diagnosis present

## 2019-07-27 DIAGNOSIS — E119 Type 2 diabetes mellitus without complications: Secondary | ICD-10-CM | POA: Diagnosis present

## 2019-07-27 DIAGNOSIS — Z20828 Contact with and (suspected) exposure to other viral communicable diseases: Secondary | ICD-10-CM | POA: Diagnosis present

## 2019-07-27 LAB — CBC WITH DIFFERENTIAL/PLATELET
Abs Immature Granulocytes: 0.06 10*3/uL (ref 0.00–0.07)
Basophils Absolute: 0.1 10*3/uL (ref 0.0–0.1)
Basophils Relative: 1 %
Eosinophils Absolute: 0.1 10*3/uL (ref 0.0–0.5)
Eosinophils Relative: 1 %
HCT: 44.4 % (ref 36.0–46.0)
Hemoglobin: 15 g/dL (ref 12.0–15.0)
Immature Granulocytes: 1 %
Lymphocytes Relative: 27 %
Lymphs Abs: 2.6 10*3/uL (ref 0.7–4.0)
MCH: 31.8 pg (ref 26.0–34.0)
MCHC: 33.8 g/dL (ref 30.0–36.0)
MCV: 94.3 fL (ref 80.0–100.0)
Monocytes Absolute: 1 10*3/uL (ref 0.1–1.0)
Monocytes Relative: 10 %
Neutro Abs: 5.9 10*3/uL (ref 1.7–7.7)
Neutrophils Relative %: 60 %
Platelets: 269 10*3/uL (ref 150–400)
RBC: 4.71 MIL/uL (ref 3.87–5.11)
RDW: 12.7 % (ref 11.5–15.5)
WBC: 9.8 10*3/uL (ref 4.0–10.5)
nRBC: 0 % (ref 0.0–0.2)

## 2019-07-27 LAB — TROPONIN I (HIGH SENSITIVITY)
Troponin I (High Sensitivity): 29 ng/L — ABNORMAL HIGH (ref <18)
Troponin I (High Sensitivity): 43 ng/L — ABNORMAL HIGH (ref <18)

## 2019-07-27 LAB — BASIC METABOLIC PANEL
Anion gap: 17 — ABNORMAL HIGH (ref 5–15)
BUN: 22 mg/dL (ref 8–23)
CO2: 25 mmol/L (ref 22–32)
Calcium: 10.2 mg/dL (ref 8.9–10.3)
Chloride: 90 mmol/L — ABNORMAL LOW (ref 98–111)
Creatinine, Ser: 0.55 mg/dL (ref 0.44–1.00)
GFR calc Af Amer: >60 mL/min (ref >60)
GFR calc non Af Amer: >60 mL/min (ref >60)
Glucose, Bld: 149 mg/dL — ABNORMAL HIGH (ref 70–99)
Potassium: 4 mmol/L (ref 3.5–5.1)
Sodium: 132 mmol/L — ABNORMAL LOW (ref 135–145)

## 2019-07-27 LAB — SARS CORONAVIRUS 2 BY RT PCR (HOSPITAL ORDER, PERFORMED IN ~~LOC~~ HOSPITAL LAB): SARS Coronavirus 2: NEGATIVE

## 2019-07-27 MED ORDER — IPRATROPIUM-ALBUTEROL 0.5-2.5 (3) MG/3ML IN SOLN
3.0000 mL | Freq: Four times a day (QID) | RESPIRATORY_TRACT | Status: DC
  Administered 2019-07-28 (×2): 3 mL via RESPIRATORY_TRACT
  Filled 2019-07-27 (×2): qty 3

## 2019-07-27 MED ORDER — PREDNISONE 20 MG PO TABS
40.0000 mg | ORAL_TABLET | Freq: Every day | ORAL | Status: DC
  Administered 2019-07-28 – 2019-07-29 (×2): 40 mg via ORAL
  Filled 2019-07-27 (×2): qty 2

## 2019-07-27 MED ORDER — LORATADINE 10 MG PO TABS
10.0000 mg | ORAL_TABLET | Freq: Every day | ORAL | Status: DC
  Administered 2019-07-28 – 2019-07-29 (×2): 10 mg via ORAL
  Filled 2019-07-27 (×2): qty 1

## 2019-07-27 MED ORDER — INSULIN ASPART 100 UNIT/ML ~~LOC~~ SOLN
0.0000 [IU] | Freq: Three times a day (TID) | SUBCUTANEOUS | Status: DC
  Administered 2019-07-28: 1 [IU] via SUBCUTANEOUS
  Administered 2019-07-28 (×2): 2 [IU] via SUBCUTANEOUS
  Filled 2019-07-27 (×3): qty 1

## 2019-07-27 MED ORDER — LISINOPRIL 10 MG PO TABS
10.0000 mg | ORAL_TABLET | Freq: Every day | ORAL | Status: DC
  Administered 2019-07-28 – 2019-07-29 (×2): 10 mg via ORAL
  Filled 2019-07-27 (×2): qty 1

## 2019-07-27 MED ORDER — AZITHROMYCIN 500 MG PO TABS
500.0000 mg | ORAL_TABLET | Freq: Every day | ORAL | Status: DC
  Administered 2019-07-28 – 2019-07-29 (×2): 500 mg via ORAL
  Filled 2019-07-27 (×2): qty 1

## 2019-07-27 MED ORDER — SODIUM CHLORIDE 0.9 % IV SOLN
500.0000 mg | INTRAVENOUS | Status: AC
  Administered 2019-07-28: 500 mg via INTRAVENOUS
  Filled 2019-07-27: qty 500

## 2019-07-27 MED ORDER — SODIUM CHLORIDE 0.9 % IV SOLN
INTRAVENOUS | Status: DC
  Administered 2019-07-28: 01:00:00 via INTRAVENOUS

## 2019-07-27 MED ORDER — ENOXAPARIN SODIUM 30 MG/0.3ML ~~LOC~~ SOLN
30.0000 mg | SUBCUTANEOUS | Status: DC
  Administered 2019-07-28 – 2019-07-29 (×2): 30 mg via SUBCUTANEOUS
  Filled 2019-07-27 (×2): qty 0.3

## 2019-07-27 MED ORDER — FLUTICASONE FUROATE-VILANTEROL 200-25 MCG/INH IN AEPB
1.0000 | INHALATION_SPRAY | Freq: Every day | RESPIRATORY_TRACT | Status: DC
  Administered 2019-07-28 – 2019-07-29 (×2): 1 via RESPIRATORY_TRACT
  Filled 2019-07-27: qty 28

## 2019-07-27 MED ORDER — INSULIN ASPART 100 UNIT/ML ~~LOC~~ SOLN: 0.0000 [IU] | Freq: Every day | SUBCUTANEOUS | Status: DC

## 2019-07-27 NOTE — ED Triage Notes (Signed)
SHOB for last 2 days. Cough that is worse than baseline.   125 mg solumedrol by EMS and 2 duoneb. Pt remains tripod position and significant increased WOB.  No fever.

## 2019-07-27 NOTE — ED Notes (Signed)
X-ray at bedside

## 2019-07-27 NOTE — ED Provider Notes (Signed)
[facility] Emergency Department Provider Note  ____________________________________________   I have reviewed the triage vital signs and the nursing notes.   HISTORY  Chief Complaint Shortness of Breath   History limited by: Not Limited   HPI Lindsey Nguyen is a 68 y.o. female who presents to the emergency department today because of concerns for shortness of breath.  Patient does have a history of COPD.  She does have inhalers at home.  She states over the past couple of days she has noticed her exercise tolerance has decreased significantly.  She states it only take a few steps before she comes short of breath.  She denies any significant chest pain associated with the shortness of breath.  She has had a cough although it has not been able to bring anything up.  She denies any fevers.  Records reviewed. Per medical record review patient has a history of ER visit for COPD exacerbation in the past.   Past Medical History:  Diagnosis Date  . Cancer [facility])    right breast cancer with lumpectomy and rad tx  . COPD (chronic obstructive pulmonary disease) ([place])   . Diabetes mellitus without complication ([place])   . Hypertension   . Osteoporosis     Patient Active Problem List   Diagnosis Date Noted  . Acute pansinusitis 04/02/2018  . Chronic bronchitis with acute exacerbation ([place]) 04/02/2018  . Acute on chronic respiratory failure with hypoxia ([place]) 10/28/2017  . Diabetes mellitus without complication ([place]) [id number]  . Aortic atherosclerosis ([place]) 05/08/2016  . Allergic rhinitis due to pollen 05/08/2016  . Excessive weight loss 02/28/2016  . Essential hypertension 05/19/2015  . Prediabetes 05/19/2015  . Osteoporosis 05/19/2015  . COLD (chronic obstructive lung disease) ([place]) 05/19/2015    Past Surgical History:  Procedure Laterality Date  . BREAST LUMPECTOMY     x 2  . BUNIONECTOMY Right   . COLONOSCOPY     normal  . PERIPHERAL VASCULAR  CATHETERIZATION N/A 04/10/2016   Procedure: Lower Extremity Angiography;  Surgeon: Katha Cabal, MD;  Location: [place] [facility];  Service: Cardiovascular;  Laterality: N/A;  . PERIPHERAL VASCULAR CATHETERIZATION  04/10/2016   Procedure: Lower Extremity Intervention;  Surgeon: Katha Cabal, MD;  Location: [place] [facility];  Service: Cardiovascular;;  . TUBAL LIGATION      Prior to Admission medications   Medication Sig Start Date End Date Taking? Authorizing Provider  albuterol (PROVENTIL HFA;VENTOLIN HFA) 108 (90 Base) MCG/ACT inhaler Inhale 2 puffs into the lungs every 6 (six) hours as needed for wheezing or shortness of breath. 10/10/18   Juline Patch, MD  fluticasone furoate-vilanterol (BREO ELLIPTA) 200-25 MCG/INH AEPB Inhale 1 puff into the lungs daily. 04/30/19   Juline Patch, MD  lisinopril (ZESTRIL) 10 MG tablet Take 1 tablet (10 mg total) by mouth daily. 04/30/19   Juline Patch, MD  loratadine (CLARITIN) 10 MG tablet Take 1 tablet (10 mg total) by mouth daily. 04/30/19   Juline Patch, MD  metFORMIN (GLUCOPHAGE) 500 MG tablet Take 1 tablet (500 mg total) by mouth daily. 04/30/19   Juline Patch, MD  predniSONE (DELTASONE) 20 MG tablet Take 3 tablets (60 mg total) by mouth daily. Patient not taking: Reported on 10/23/2018 10/10/18   Juline Patch, MD  tiotropium [facility] HANDIHALER) 18 MCG inhalation capsule pulm 07/19/18   [provider]    Allergies Tetanus toxoids  Family History  Problem Relation Age of Onset  .  Diabetes Father   . Kidney cancer Father   . Cancer Brother   . Diabetes Paternal Aunt   . Diabetes Paternal Uncle   . Kidney failure Mother     Social History Social History   Tobacco Use  . Smoking status: Current Every Day Smoker    Packs/day: 1.00    Years: 50.00    Pack years: 50.00    Types: Cigarettes  . Smokeless tobacco: Never Used  Substance Use Topics  . Alcohol use: Yes    Alcohol/week: 21.0 standard drinks     Types: 14 Glasses of wine, 7 Cans of beer per week  . Drug use: No    Review of Systems Constitutional: No fever/chills Eyes: No visual changes. ENT: No sore throat. Cardiovascular: Denies chest pain. Respiratory: Positive for shortness of breath. Gastrointestinal: No abdominal pain.  No nausea, no vomiting.  No diarrhea.   Genitourinary: Negative for dysuria. Musculoskeletal: Negative for back pain. Skin: Negative for rash. Neurological: Negative for headaches, focal weakness or numbness.  ____________________________________________   PHYSICAL EXAM:  VITAL SIGNS: ED Triage Vitals  Enc Vitals Group     BP 07/27/19 2031 (!) 172/100     Pulse Rate 07/27/19 2031 (!) 115     Resp 07/27/19 2031 (!) 38     Temp --      Temp src --      SpO2 07/27/19 2031 96 %     Weight 07/27/19 2021 70 lb (31.8 kg)     Height 07/27/19 2021 5\' 4"  (1.626 m)     Head Circumference --      Peak Flow --      Pain Score 07/27/19 2021 0   Constitutional: Alert and oriented.  Eyes: Conjunctivae are normal.  ENT      Head: Normocephalic and atraumatic.      Nose: No congestion/rhinnorhea.      Mouth/Throat: Mucous membranes are moist.      Neck: No stridor. Hematological/Lymphatic/Immunilogical: No cervical lymphadenopathy. Cardiovascular: Normal rate, regular rhythm.  No murmurs, rubs, or gallops.  Respiratory: Tachypnea, poor air movement diffusely. Increased work of breathing.  Gastrointestinal: Soft and non tender. No rebound. No guarding.  Genitourinary: Deferred Musculoskeletal: Normal range of motion in all extremities. No lower extremity edema. Neurologic:  Normal speech and language. No gross focal neurologic deficits are appreciated.  Skin:  Skin is warm, dry and intact. No rash noted. Psychiatric: Mood and affect are normal. Speech and behavior are normal. Patient exhibits appropriate insight and judgment.  ____________________________________________    LABS (pertinent  positives/negatives)  CBC wbc 9.8, hgb 15.0, plt 269 Other labs pending ____________________________________________   EKG  I, Nance Pear, attending physician, personally viewed and interpreted this EKG  EKG Time: 1457 Rate: 114 Rhythm: sinus tachycardia Axis: right axis deviation Intervals: qtc 476 QRS: incomplete RBBB ST changes: no st elevation Impression: abnormal ekg   ____________________________________________    RADIOLOGY  CXR COPD  ____________________________________________   PROCEDURES  Procedures  ____________________________________________   INITIAL IMPRESSION / ASSESSMENT AND PLAN / ED COURSE  Pertinent labs & imaging results that were available during my care of the patient were reviewed by me and considered in my medical decision making (see chart for details).   Patient presented to the emergency department today because of concern for shortness of breath. Patient with history of copd. X-ray does not show pneumonia. Patient was given solumedrol and breathing treatments from EMS. CXR without pneumonia. Will plan on admission given SOB.  ____________________________________________   FINAL CLINICAL IMPRESSION(S) / ED DIAGNOSES  Final diagnoses:  COPD exacerbation ([place])     Note: This dictation was prepared with Dragon dictation. Any transcriptional errors that result from this process are unintentional     Nance Pear, MD 07/28/19 1316

## 2019-07-28 ENCOUNTER — Inpatient Hospital Stay: Payer: 59

## 2019-07-28 ENCOUNTER — Other Ambulatory Visit: Payer: Self-pay

## 2019-07-28 LAB — BASIC METABOLIC PANEL WITH GFR
Anion gap: 13 (ref 5–15)
BUN: 20 mg/dL (ref 8–23)
CO2: 25 mmol/L (ref 22–32)
Calcium: 9.5 mg/dL (ref 8.9–10.3)
Chloride: 95 mmol/L — ABNORMAL LOW (ref 98–111)
Creatinine, Ser: 0.44 mg/dL (ref 0.44–1.00)
GFR calc Af Amer: >60 mL/min
GFR calc non Af Amer: >60 mL/min
Glucose, Bld: 172 mg/dL — ABNORMAL HIGH (ref 70–99)
Potassium: 4.1 mmol/L (ref 3.5–5.1)
Sodium: 133 mmol/L — ABNORMAL LOW (ref 135–145)

## 2019-07-28 LAB — GLUCOSE, CAPILLARY
Glucose-Capillary: 144 mg/dL — ABNORMAL HIGH (ref 70–99)
Glucose-Capillary: 153 mg/dL — ABNORMAL HIGH (ref 70–99)
Glucose-Capillary: 123 mg/dL — ABNORMAL HIGH (ref 70–99)
Glucose-Capillary: 152 mg/dL — ABNORMAL HIGH (ref 70–99)
Glucose-Capillary: 136 mg/dL — ABNORMAL HIGH (ref 70–99)

## 2019-07-28 LAB — CBC
HCT: 41.6 % (ref 36.0–46.0)
Hemoglobin: 14.2 g/dL (ref 12.0–15.0)
MCH: 31.7 pg (ref 26.0–34.0)
MCHC: 34.1 g/dL (ref 30.0–36.0)
MCV: 92.9 fL (ref 80.0–100.0)
Platelets: 257 K/uL (ref 150–400)
RBC: 4.48 MIL/uL (ref 3.87–5.11)
RDW: 12.3 % (ref 11.5–15.5)
WBC: 5.8 K/uL (ref 4.0–10.5)
nRBC: 0 % (ref 0.0–0.2)

## 2019-07-28 LAB — HEMOGLOBIN A1C
Hgb A1c MFr Bld: 6 % — ABNORMAL HIGH (ref 4.8–5.6)
Mean Plasma Glucose: 125.5 mg/dL

## 2019-07-28 LAB — HIV ANTIBODY (ROUTINE TESTING W REFLEX): HIV Screen 4th Generation wRfx: NONREACTIVE

## 2019-07-28 MED ORDER — IOHEXOL 300 MG/ML  SOLN
75.0000 mL | Freq: Once | INTRAMUSCULAR | Status: AC | PRN
  Administered 2019-07-28: 60 mL via INTRAVENOUS

## 2019-07-28 MED ORDER — ALBUTEROL SULFATE (2.5 MG/3ML) 0.083% IN NEBU: 2.5000 mg | INHALATION_SOLUTION | RESPIRATORY_TRACT | Status: DC | PRN

## 2019-07-28 MED ORDER — IPRATROPIUM-ALBUTEROL 0.5-2.5 (3) MG/3ML IN SOLN
3.0000 mL | Freq: Three times a day (TID) | RESPIRATORY_TRACT | Status: DC
  Administered 2019-07-28 (×2): 3 mL via RESPIRATORY_TRACT
  Filled 2019-07-28 (×2): qty 3

## 2019-07-28 MED ORDER — NICOTINE 21 MG/24HR TD PT24
21.0000 mg | MEDICATED_PATCH | Freq: Every day | TRANSDERMAL | Status: DC
  Administered 2019-07-28 – 2019-07-29 (×2): 21 mg via TRANSDERMAL
  Filled 2019-07-28 (×2): qty 1

## 2019-07-28 MED ORDER — INFLUENZA VAC A&B SA ADJ QUAD 0.5 ML IM PRSY
0.5000 mL | PREFILLED_SYRINGE | INTRAMUSCULAR | Status: DC
  Filled 2019-07-28: qty 0.5

## 2019-07-28 MED ORDER — ADULT MULTIVITAMIN W/MINERALS CH
1.0000 | ORAL_TABLET | Freq: Every day | ORAL | Status: DC
  Administered 2019-07-29: 1 via ORAL
  Filled 2019-07-28: qty 1

## 2019-07-28 NOTE — H&P (Addendum)
[facility] [place] NAME: Lindsey Nguyen    MR#:  [id number]  DATE OF BIRTH:  [birth year]  DATE OF ADMISSION:  07/27/2019  PRIMARY CARE PHYSICIAN: Juline Patch, MD   REQUESTING/REFERRING PHYSICIAN: Nance Pear, MD  CHIEF COMPLAINT:   Chief Complaint  Patient presents with  . Shortness of Breath    HISTORY OF PRESENT ILLNESS:  68 y.o. female with pertinent past medical history of right breast cancer, COPD, tobacco abuse, diabetes mellitus, hypertension, and osteoporosis presenting to the ED with chief complaints of worsening shortness of breath.  Patient report onset of shortness of breath since 2 days ago associated with wheezing and productive cough.  She does admit to smoking 10 to 12 cigarettes today prior to onset of symptoms.  Patient states that she does not use oxygen at home.  Denies associated symptoms of fevers or chills, chest pain, diaphoresis, nausea vomiting or recent sick contacts.   On arrival to the ED, she was afebrile with blood pressure 172/100 mm Hg and pulse rate 115 beats/min. There were no focal neurological deficits; she was alert and oriented x4, with significant increased work of breathing.  Initial labs revealed unremarkable CBC, sodium 132, glucose 149, anion gap 17, troponin 29, repeat 43, Covid 19 negative. Chest xray shows hyperinflated lungs consistent with COPD. Patient received steroids and Duonebs in the ED with significant improvement in her breathing noted.  PAST MEDICAL HISTORY:   Past Medical History:  Diagnosis Date  . Cancer [facility])    right breast cancer with lumpectomy and rad tx  . COPD (chronic obstructive pulmonary disease) ([place])   . Diabetes mellitus without complication ([place])   . Hypertension   . Osteoporosis     PAST SURGICAL HISTORY:   Past Surgical History:  Procedure Laterality Date  . BREAST LUMPECTOMY     x 2  . BUNIONECTOMY Right   . COLONOSCOPY     normal  .  PERIPHERAL VASCULAR CATHETERIZATION N/A 04/10/2016   Procedure: Lower Extremity Angiography;  Surgeon: Katha Cabal, MD;  Location: [place] [facility];  Service: Cardiovascular;  Laterality: N/A;  . PERIPHERAL VASCULAR CATHETERIZATION  04/10/2016   Procedure: Lower Extremity Intervention;  Surgeon: Katha Cabal, MD;  Location: [place] [facility];  Service: Cardiovascular;;  . TUBAL LIGATION      SOCIAL HISTORY:   Social History   Tobacco Use  . Smoking status: Current Every Day Smoker    Packs/day: 1.00    Years: 50.00    Pack years: 50.00    Types: Cigarettes  . Smokeless tobacco: Never Used  Substance Use Topics  . Alcohol use: Yes    Alcohol/week: 21.0 standard drinks    Types: 14 Glasses of wine, 7 Cans of beer per week    FAMILY HISTORY:   Family History  Problem Relation Age of Onset  . Diabetes Father   . Kidney cancer Father   . Cancer Brother   . Diabetes Paternal Aunt   . Diabetes Paternal Uncle   . Kidney failure Mother     DRUG ALLERGIES:   Allergies  Allergen Reactions  . Tetanus Toxoids Swelling    REVIEW OF SYSTEMS:   Review of Systems  Constitutional: Negative for chills, fever, malaise/fatigue and weight loss.  HENT: Negative for congestion, hearing loss and sore throat.   Eyes: Negative for blurred vision and double vision.  Respiratory: Positive for cough, sputum production, shortness of breath and wheezing.  Cardiovascular: Positive for orthopnea. Negative for chest pain, palpitations and leg swelling.  Gastrointestinal: Negative for abdominal pain, diarrhea, nausea and vomiting.  Genitourinary: Negative for dysuria and urgency.  Musculoskeletal: Negative for myalgias.  Skin: Negative for rash.  Neurological: Negative for dizziness, sensory change, speech change, focal weakness and headaches.  Psychiatric/Behavioral: Negative for depression.   MEDICATIONS AT HOME:   Prior to Admission medications   Medication Sig Start Date  End Date Taking? Authorizing Provider  albuterol (PROVENTIL HFA;VENTOLIN HFA) 108 (90 Base) MCG/ACT inhaler Inhale 2 puffs into the lungs every 6 (six) hours as needed for wheezing or shortness of breath. 10/10/18  Yes Juline Patch, MD  fluticasone furoate-vilanterol (BREO ELLIPTA) 200-25 MCG/INH AEPB Inhale 1 puff into the lungs daily. 04/30/19  Yes Juline Patch, MD  lisinopril (ZESTRIL) 10 MG tablet Take 1 tablet (10 mg total) by mouth daily. 04/30/19  Yes Juline Patch, MD  loratadine (CLARITIN) 10 MG tablet Take 1 tablet (10 mg total) by mouth daily. 04/30/19  Yes Juline Patch, MD  metFORMIN (GLUCOPHAGE) 500 MG tablet Take 1 tablet (500 mg total) by mouth daily. 04/30/19  Yes Juline Patch, MD  tiotropium (SPIRIVA HANDIHALER) 18 MCG inhalation capsule Place 18 mcg into inhaler and inhale daily. pulm 07/19/18  Yes [provider]      VITAL SIGNS:  Blood pressure 123/71, pulse 98, temperature 97.7 F (36.5 C), temperature source Oral, resp. rate (!) 22, height 5\' 4"  (1.626 m), weight 31.8 kg, SpO2 94 %.  PHYSICAL EXAMINATION:   Physical Exam  GENERAL:  68 y.o.-year-old patient lying in the bed with no acute distress.  EYES: Pupils equal, round, reactive to light and accommodation. No scleral icterus. Extraocular muscles intact.  HEENT: Head atraumatic, normocephalic. Oropharynx and nasopharynx clear.  NECK:  Supple, no jugular venous distention. No thyroid enlargement, no tenderness.  LUNGS: Decreased breath sounds bilaterally, mild wheezing bilaterally. No rales,rhonchi or crepitation. No use of accessory muscles of respiration.  CARDIOVASCULAR: S1, S2 normal. No murmurs, rubs, or gallops.  ABDOMEN: Soft, nontender, nondistended. Bowel sounds present. No organomegaly or mass.  EXTREMITIES: No pedal edema, cyanosis, or clubbing.  NEUROLOGIC: Cranial nerves II through XII are intact. Muscle strength 5/5 in all extremities. Sensation intact. Gait not checked.   PSYCHIATRIC: The patient is alert and oriented x 3.  SKIN: No obvious rash, lesion, or ulcer.   DATA REVIEWED:  LABORATORY PANEL:   CBC Recent Labs  Lab 07/27/19 2045  WBC 9.8  HGB 15.0  HCT 44.4  PLT 269   ------------------------------------------------------------------------------------------------------------------  Chemistries  Recent Labs  Lab 07/27/19 2126  NA 132*  K 4.0  CL 90*  CO2 25  GLUCOSE 149*  BUN 22  CREATININE 0.55  CALCIUM 10.2   ------------------------------------------------------------------------------------------------------------------  Cardiac Enzymes No results for input(s): TROPONINI in the last 168 hours. ------------------------------------------------------------------------------------------------------------------  RADIOLOGY:  Dg Chest Portable 1 View  Result Date: 07/27/2019 CLINICAL DATA:  Shortness of breath for 2 days EXAM: PORTABLE CHEST 1 VIEW COMPARISON:  04/08/2018 FINDINGS: Cardiac shadows within normal limits. The lungs are hyperinflated consistent with COPD. No focal infiltrate or sizable effusion is noted. No bony abnormality is seen. IMPRESSION: COPD without acute abnormality. Electronically Signed   By: Inez Catalina M.D.   On: 07/27/2019 21:09    EKG:  EKG: 1457 Rate: 114 Rhythm: sinus tachycardia Axis: right axis deviation Intervals: qtc 476 QRS: incomplete RBBB ST changes: no st elevation Impression: abnormal ekg  IMPRESSION AND PLAN:  68 y.o. female ith pertinent past medical history of right breast cancer, COPD, tobacco abuse, diabetes mellitus, hypertension, and osteoporosis presenting to the ED with chief complaints of worsening shortness of breath.  1. Acute on chronic COPD Exacerbation - patient with hx of COPD not on home oxygen. Presenting with SOB,worsening productive cough and nausea. Admits to smoking 10-12 cigarettes prior to onset of symptoms - Admit to medsurg unit - Chest xray reviewed and  shows hyperinflated lungs consisted with COPD - Supplemental O2, goal sat 88-92% - Bronchodilators (albuterol/ipratropium) standing and PRN  - steroids PO - IV abx Azithromycin x 5 days  2. Elevated troponin- Likely demand ischemia. Denies chest pain - Trend troponin  3. Diabetes mellitus - Check hemoglobin A1c - Hold Metformin - SSI  4. Hypertension - Continue lisinopril  5. Tobacco abuse -smoking cessation counseling provided - Nicotine patch offered  6. DVT prophylaxis - Enoxaparin SubQ    All the records are reviewed and case discussed with ED provider. Management plans discussed with the patient, family and they are in agreement.  CODE STATUS: FULL  TOTAL TIME TAKING CARE OF THIS PATIENT: 50 minutes.    on 07/28/2019 at 2:54 AM   Rufina Falco, DNP, FNP-BC [facility] Nurse Practitioner Between 7am to 6pm - Pager [phone]  After 6pm go to [email or web address] - password [email or web address] [facility]  Office  [phone]  CC: Primary care physician; Juline Patch, MD

## 2019-07-28 NOTE — Evaluation (Signed)
Physical Therapy Evaluation Patient Details Name: Lindsey Nguyen MRN: [id number] DOB: Dec 11, 1950 Today's Date: 07/28/2019   History of Present Illness  presented to ER secondary to progressive SOB x2 days; admitted for management of acute/chronic COPD exacerbation, noted with mild elevation in troponin (demand), covid negative.  Clinical Impression  Upon evaluation, patient alert and oriented; follows commands and eager for OOB trials with therapist.  Denies pain and reports respiratory status improved since admission; remains on RA at this time.  Bilat UE/LE strength and ROM grossly symmetrical and WFL; no focal weakness appreciated.  Able to complete bed mobility indep; sit/stand, basic transfers and gait (25') with single HHA, cga/close sup.   Demonstrates reciprocal stepping pattern; slightly guarded posturing; excessive weight shift to R LE, but no overt buckling or LOB.  Minimal SOB noted (sats >92% on RA); patient with good awareness of need for activity pacing/energy conservation (self-terminating gait as needed). Would benefit from skilled PT to address above deficits and promote optimal return to PLOF.; Recommend transition to HHPT upon discharge from acute hospitalization.     Follow Up Recommendations Home health PT    Equipment Recommendations       Recommendations for Other Services       Precautions / Restrictions Precautions Precautions: Fall Restrictions Weight Bearing Restrictions: No      Mobility  Bed Mobility Overal bed mobility: Modified Independent                Transfers Overall transfer level: Needs assistance Equipment used: None Transfers: Sit to/from Stand Sit to Stand: Min guard;Supervision         General transfer comment: fair/good LE strength and power, limited UE assist required  Ambulation/Gait Ambulation/Gait assistance: Min guard Gait Distance (Feet): 20 Feet Assistive device: 1 person hand held assist       General Gait  Details: reciprocal stepping pattern; slightly guarded posturing; excessive weight shift to R LE, but no overt buckling or LOB.  Minimal SOB noted (sats >92% on RA); patient with good awareness of need for activity pacing/energy conservation (self-terminating gait as needed).  Stairs            Wheelchair Mobility    Modified Rankin (Stroke Patients Only)       Balance Overall balance assessment: Needs assistance Sitting-balance support: No upper extremity supported;Feet supported Sitting balance-Leahy Scale: Good     Standing balance support: No upper extremity supported Standing balance-Leahy Scale: Fair                               Pertinent Vitals/Pain Pain Assessment: No/denies pain    Home Living Family/patient expects to be discharged to:: Private residence Living Arrangements: Non-relatives/Friends   Type of Home: House Home Access: Stairs to enter Entrance Stairs-Rails: Right Entrance Stairs-Number of Steps: 3 Home Layout: One level Home Equipment: None      Prior Function Level of Independence: Independent         Comments: Indep with ADLs, household and limited community mobilization without assist device; no home O2.  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  Denies fall history.     Hand Dominance        Extremity/Trunk Assessment   Upper Extremity Assessment Upper Extremity Assessment: Overall WFL for tasks assessed    Lower Extremity Assessment Lower Extremity Assessment: Overall WFL for tasks assessed(grossly at least 4/5 bilat)       Communication   Communication: No  difficulties  Cognition Arousal/Alertness: Awake/alert Behavior During Therapy: WFL for tasks assessed/performed Overall Cognitive Status: Within Functional Limits for tasks assessed                                        General Comments      Exercises Other Exercises Other Exercises: Bed/chair transfer without  assist device, cga/close sup; easily negotiates turns and room environment   Assessment/Plan    PT Assessment Patient needs continued PT services  PT Problem List Decreased range of motion;Decreased activity tolerance;Decreased balance;Decreased mobility;Cardiopulmonary status limiting activity       PT Treatment Interventions DME instruction;Gait training;Stair training;Functional mobility training;Therapeutic activities;Therapeutic exercise;Balance training;Patient/family education    PT Goals (Current goals can be found in the Care Plan section)  Acute Rehab PT Goals Patient Stated Goal: to return home, breathe better PT Goal Formulation: With patient Time For Goal Achievement: 08/11/19 Potential to Achieve Goals: Good    Frequency Min 2X/week   Barriers to discharge Decreased caregiver support      Co-evaluation               AM-PAC PT "6 Clicks" Mobility  Outcome Measure Help needed turning from your back to your side while in a flat bed without using bedrails?: None Help needed moving from lying on your back to sitting on the side of a flat bed without using bedrails?: None Help needed moving to and from a bed to a chair (including a wheelchair)?: A Little Help needed standing up from a chair using your arms (e.g., wheelchair or bedside chair)?: A Little Help needed to walk in hospital room?: A Little Help needed climbing 3-5 steps with a railing? : A Little 6 Click Score: 20    End of Session Equipment Utilized During Treatment: Gait belt Activity Tolerance: Patient tolerated treatment well Patient left: in chair;with call bell/phone within reach;with chair alarm set Nurse Communication: Mobility status PT Visit Diagnosis: Muscle weakness (generalized) (M62.81);Difficulty in walking, not elsewhere classified (R26.2)    Time: [id number] PT Time Calculation (min) (ACUTE ONLY): 16 min   Charges:   PT Evaluation $PT Eval Moderate Complexity: 1 Mod PT  Treatments $Therapeutic Activity: 8-22 mins        Elizet Kaplan H. Owens Shark, PT, DPT, NCS 07/28/19, 1:53 PM [phone]

## 2019-07-28 NOTE — Progress Notes (Signed)
Initial Nutrition Assessment  DOCUMENTATION CODES:   Severe malnutrition in context of chronic illness  INTERVENTION:   MVI daily   Liberalize diet   Pt declines all oral nutrition supplements  NUTRITION DIAGNOSIS:   Severe Malnutrition related to chronic illness(COPD, breast cancer) as evidenced by severe muscle depletion, severe fat depletion.  GOAL:   Patient will meet greater than or equal to 90% of their needs  MONITOR:   PO intake, Supplement acceptance, Labs, Weight trends, Skin, I & O's  REASON FOR ASSESSMENT:   Consult Assessment of nutrition requirement/status  ASSESSMENT:   68 y.o. female with pertinent past medical history of right breast cancer, COPD, tobacco abuse, diabetes mellitus, hypertension, and osteoporosis presenting to the ED with chief complaints of worsening shortness of breath and found to have COPD exacerbation   Pt reports good appetite and oral intake at baseline. Pt reports that she normally eats toast with peanut butter or a bacon, egg and cheese sandwich at breakfast and a piece or chicken or steak with vegetables at lunch and dinner. Pt does not drink supplements as she reports that they are too sweet. RD offered Ensure Max in hospital but patient declined. Pt ate 60% of her breakfast this morning; pt is upset about the restrictions of her diet. RD will liberalize pt's diet as pt is not eating enough to exceed any nutrient limits at baseline.   Per chart, pt is weight stable pta.   Medications reviewed and include: lovenox, insulin, nicotine, prednisone  Labs reviewed: Na 133(L) cbgs- 144, 153, 123 x 24 hrs AIC 6.0(H)- 10/27  NUTRITION - FOCUSED PHYSICAL EXAM:    Most Recent Value  Orbital Region  Moderate depletion  Upper Arm Region  Severe depletion  Thoracic and Lumbar Region  Severe depletion  Buccal Region  Moderate depletion  Temple Region  Severe depletion  Clavicle Bone Region  Severe depletion  Clavicle and Acromion Bone  Region  Severe depletion  Scapular Bone Region  Severe depletion  Dorsal Hand  Severe depletion  Patellar Region  Severe depletion  Anterior Thigh Region  Severe depletion  Posterior Calf Region  Severe depletion  Edema (RD Assessment)  None  Hair  Reviewed  Eyes  Reviewed  Mouth  Reviewed  Skin  Reviewed  Nails  Reviewed     Diet Order:   Diet Order            Diet regular Room service appropriate? Yes; Fluid consistency: Thin  Diet effective now             EDUCATION NEEDS:   Education needs have been addressed  Skin:  Skin Assessment: Reviewed RN Assessment  Last BM:  10/26  Height:   Ht Readings from Last 1 Encounters:  07/27/19 5\' 4"  (1.626 m)    Weight:   Wt Readings from Last 1 Encounters:  07/27/19 31.8 kg    Ideal Body Weight:  54.5 kg  BMI:  Body mass index is 12.02 kg/m.  Estimated Nutritional Needs:   Kcal:  1200-1400kcal/day  Protein:  60-70g/day  Fluid:  800-1068ml/day  Koleen Distance MS, RD, LDN Pager #- [phone] Office#- [phone] After Hours Pager: [phone]

## 2019-07-28 NOTE — Progress Notes (Signed)
PHARMACIST - PHYSICIAN COMMUNICATION  CONCERNING:  Enoxaparin (Lovenox) for DVT Prophylaxis    RECOMMENDATION: Patient was prescribed enoxaprin 40mg  q24 hours for VTE prophylaxis.   Filed Weights   07/27/19 2021  Weight: 70 lb (31.8 kg)    Body mass index is 12.02 kg/m.  Estimated Creatinine Clearance: 34.3 mL/min (by C-G formula based on SCr of 0.55 mg/dL).  Patient is candidate for enoxaparin 30mg  every 24 hours based on CrCl <55ml/min or Weight less then 45kg   DESCRIPTION: Pharmacy has adjusted enoxaparin dose per [facility] policy.  Patient is now receiving enoxaparin 30mg  every 24 hours.  Ena Dawley, PharmD Clinical Pharmacist  07/28/2019 1:14 AM

## 2019-07-28 NOTE — Progress Notes (Signed)
[place] at [facility]                                                                                                                                                                                  Patient Demographics   Lindsey Nguyen, is a 68 y.o. female, DOB - Jan 16, 1951, [id number]  Admit date - 07/27/2019   Admitting Physician Lang Snow, NP  Outpatient Primary MD for the patient is Juline Patch, MD   LOS - 1  Subjective: Patient continues to be short of breath.  She states that she has not seen her pulmonologist since the pandemic. To note she had a CT scan of the chest in January which showed a spiculated mass which patient was supposed to follow-up with radiation oncology which she did not   Review of Systems:   CONSTITUTIONAL: No documented fever. No fatigue, weakness. No weight gain, no weight loss.  EYES: No blurry or double vision.  ENT: No tinnitus. No postnasal drip. No redness of the oropharynx.  RESPIRATORY: No cough, no wheeze, no hemoptysis.  Positive dyspnea.  CARDIOVASCULAR: No chest pain. No orthopnea. No palpitations. No syncope.  GASTROINTESTINAL: No nausea, no vomiting or diarrhea. No abdominal pain. No melena or hematochezia.  GENITOURINARY: No dysuria or hematuria.  ENDOCRINE: No polyuria or nocturia. No heat or cold intolerance.  HEMATOLOGY: No anemia. No bruising. No bleeding.  INTEGUMENTARY: No rashes. No lesions.  MUSCULOSKELETAL: No arthritis. No swelling. No gout.  NEUROLOGIC: No numbness, tingling, or ataxia. No seizure-type activity.  PSYCHIATRIC: No anxiety. No insomnia. No ADD.    Vitals:   Vitals:   07/28/19 0043 07/28/19 0231 07/28/19 0735 07/28/19 0750  BP: 123/71   (!) 128/116  Pulse: 98  91 97  Resp: (!) 22  18 17   Temp:    98.3 F (36.8 C)  TempSrc:      SpO2: 93% 94% 96% 97%  Weight:      Height:        Wt Readings from Last 3 Encounters:  07/27/19 31.8 kg  10/23/18 32.7 kg   10/10/18 33.1 kg     Intake/Output Summary (Last 24 hours) at 07/28/2019 1138 Last data filed at 07/28/2019 0954 Gross per 24 hour  Intake 500.86 ml  Output 500 ml  Net 0.86 ml    Physical Exam:   GENERAL: Pleasant-appearing in no apparent distress.  HEAD, EYES, EARS, NOSE AND THROAT: Atraumatic, normocephalic. Extraocular muscles are intact. Pupils equal and reactive to light. Sclerae anicteric. No conjunctival injection. No oro-pharyngeal erythema.  NECK: Supple. There is no jugular venous distention. No bruits, no lymphadenopathy, no thyromegaly.  HEART: Regular rate  and rhythm,. No murmurs, no rubs, no clicks.  LUNGS: Decreased breath sounds ABDOMEN: Soft, flat, nontender, nondistended. Has good bowel sounds. No hepatosplenomegaly appreciated.  EXTREMITIES: No evidence of any cyanosis, clubbing, or peripheral edema.  +2 pedal and radial pulses bilaterally.  NEUROLOGIC: The patient is alert, awake, and oriented x3 with no focal motor or sensory deficits appreciated bilaterally.  SKIN: Moist and warm with no rashes appreciated.  Psych: Not anxious, depressed LN: No inguinal LN enlargement    Antibiotics   Anti-infectives (From admission, onward)   Start     Dose/Rate Route Frequency Ordered Stop   07/28/19 2100  azithromycin (ZITHROMAX) tablet 500 mg     500 mg Oral Daily 07/27/19 2313 08/01/19 0959   07/27/19 2345  azithromycin (ZITHROMAX) 500 mg in sodium chloride 0.9 % 250 mL IVPB     500 mg 250 mL/hr over 60 Minutes Intravenous Every 24 hours 07/27/19 2313 07/28/19 0331      Medications   Scheduled Meds: . azithromycin  500 mg Oral Daily  . enoxaparin (LOVENOX) injection  30 mg Subcutaneous Q24H  . fluticasone furoate-vilanterol  1 puff Inhalation Daily  . [START ON 07/29/2019] influenza vaccine adjuvanted  0.5 mL Intramuscular Tomorrow-1000  . insulin aspart  0-5 Units Subcutaneous QHS  . insulin aspart  0-9 Units Subcutaneous TID WC  . ipratropium-albuterol   3 mL Nebulization TID  . lisinopril  10 mg Oral Daily  . loratadine  10 mg Oral Daily  . nicotine  21 mg Transdermal Daily  . predniSONE  40 mg Oral Q breakfast   Continuous Infusions: PRN Meds:.albuterol   Data Review:   Micro Results Recent Results (from the past 240 hour(s))  SARS Coronavirus 2 by RT PCR (hospital order, performed in [facility] hospital lab) Nasopharyngeal Nasopharyngeal Swab     Status: None   Collection Time: 07/27/19  9:38 PM   Specimen: Nasopharyngeal Swab  Result Value Ref Range Status   SARS Coronavirus 2 NEGATIVE NEGATIVE Final    Comment: (NOTE) If result is NEGATIVE SARS-CoV-2 target nucleic acids are NOT DETECTED. The SARS-CoV-2 RNA is generally detectable in upper and lower  respiratory specimens during the acute phase of infection. The lowest  concentration of SARS-CoV-2 viral copies this assay can detect is 250  copies / mL. A negative result does not preclude SARS-CoV-2 infection  and should not be used as the sole basis for treatment or other  patient management decisions.  A negative result may occur with  improper specimen collection / handling, submission of specimen other  than nasopharyngeal swab, presence of viral mutation(s) within the  areas targeted by this assay, and inadequate number of viral copies  (<250 copies / mL). A negative result must be combined with clinical  observations, patient history, and epidemiological information. If result is POSITIVE SARS-CoV-2 target nucleic acids are DETECTED. The SARS-CoV-2 RNA is generally detectable in upper and lower  respiratory specimens dur ing the acute phase of infection.  Positive  results are indicative of active infection with SARS-CoV-2.  Clinical  correlation with patient history and other diagnostic information is  necessary to determine patient infection status.  Positive results do  not rule out bacterial infection or co-infection with other viruses. If result is  PRESUMPTIVE POSTIVE SARS-CoV-2 nucleic acids MAY BE PRESENT.   A presumptive positive result was obtained on the submitted specimen  and confirmed on repeat testing.  While 2019 novel coronavirus  (SARS-CoV-2) nucleic acids may be present in the  submitted sample  additional confirmatory testing may be necessary for epidemiological  and / or clinical management purposes  to differentiate between  SARS-CoV-2 and other Sarbecovirus currently known to infect humans.  If clinically indicated additional testing with an alternate test  methodology [phone]) is advised. The SARS-CoV-2 RNA is generally  detectable in upper and lower respiratory sp ecimens during the acute  phase of infection. The expected result is Negative. Fact Sheet for Patients:  StrictlyIdeas.no Fact Sheet for Healthcare Providers: BankingDealers.co.za This test is not yet approved or cleared by the [place] FDA and has been authorized for detection and/or diagnosis of SARS-CoV-2 by FDA under an Emergency Use Authorization (EUA).  This EUA will remain in effect (meaning this test can be used) for the duration of the COVID-19 declaration under Section 564(b)(1) of the Act, 21 U.S.C. section 360bbb-3(b)(1), unless the authorization is terminated or revoked sooner. Performed at [facility], [address], [place], [place] [address]     Radiology Reports Dg Chest Portable 1 View  Result Date: 07/27/2019 CLINICAL DATA:  Shortness of breath for 2 days EXAM: PORTABLE CHEST 1 VIEW COMPARISON:  04/08/2018 FINDINGS: Cardiac shadows within normal limits. The lungs are hyperinflated consistent with COPD. No focal infiltrate or sizable effusion is noted. No bony abnormality is seen. IMPRESSION: COPD without acute abnormality. Electronically Signed   By: Inez Catalina M.D.   On: 07/27/2019 21:09     CBC Recent Labs  Lab 07/27/19 2045 07/28/19 0441  WBC 9.8 5.8  HGB  15.0 14.2  HCT 44.4 41.6  PLT 269 257  MCV 94.3 92.9  MCH 31.8 31.7  MCHC 33.8 34.1  RDW 12.7 12.3  LYMPHSABS 2.6  --   MONOABS 1.0  --   EOSABS 0.1  --   BASOSABS 0.1  --     Chemistries  Recent Labs  Lab 07/27/19 2126 07/28/19 0441  NA 132* 133*  K 4.0 4.1  CL 90* 95*  CO2 25 25  GLUCOSE 149* 172*  BUN 22 20  CREATININE 0.55 0.44  CALCIUM 10.2 9.5   ------------------------------------------------------------------------------------------------------------------ estimated creatinine clearance is 34.3 mL/min (by C-G formula based on SCr of 0.44 mg/dL). ------------------------------------------------------------------------------------------------------------------ Recent Labs    07/28/19 0441  HGBA1C 6.0*   ------------------------------------------------------------------------------------------------------------------ No results for input(s): CHOL, HDL, LDLCALC, TRIG, CHOLHDL, LDLDIRECT in the last 72 hours. ------------------------------------------------------------------------------------------------------------------ No results for input(s): TSH, T4TOTAL, T3FREE, THYROIDAB in the last 72 hours.  Invalid input(s): FREET3 ------------------------------------------------------------------------------------------------------------------ No results for input(s): VITAMINB12, FOLATE, FERRITIN, TIBC, IRON, RETICCTPCT in the last 72 hours.  Coagulation profile No results for input(s): INR, PROTIME in the last 168 hours.  No results for input(s): DDIMER in the last 72 hours.  Cardiac Enzymes No results for input(s): CKMB, TROPONINI, MYOGLOBIN in the last 168 hours.  Invalid input(s): CK ------------------------------------------------------------------------------------------------------------------ Invalid input(s): POCBNP    Assessment & Plan   68 y.o. female ith pertinent past medical history of right breast cancer, COPD, tobacco abuse, diabetes mellitus,  hypertension, and osteoporosis presenting to the ED with chief complaints of worsening shortness of breath.  1. Acuteon chronicCOPD Exacerbation- Limited air entry Continue nebs Continue prednisone Due to history of spiculated mass I will have pulmonary to see the patient I will obtain a CT scan of the chest   2. Elevated troponin- Likely demand ischemia. Denies chest pain - Trend troponin  3. Diabetes mellitus -Continue sliding scale insulin   4. Hypertension - Continue lisinopril  5. Tobacco abuse -smoking cessation counseling provided -  Nicotine patch offered  6. DVT prophylaxis - Enoxaparin SubQ       Code Status Orders  (From admission, onward)         Start     Ordered   07/27/19 2310  Full code  Continuous     07/27/19 2313        Code Status History    Date Active Date Inactive Code Status Order ID Comments User Context   10/28/2017 2052 10/31/2017 1734 Full Code [id number]  Henreitta Leber, MD Inpatient   04/10/2016 1217 04/10/2016 1935 Full Code [id number]  Delana Meyer Dolores Lory, MD Inpatient   Advance Care Planning Activity           Consults pulmonary  DVT Prophylaxis  Lovenox  Lab Results  Component Value Date   PLT 257 07/28/2019     Time Spent in minutes 34min Greater than 50% of time spent in care coordination and counseling patient regarding the condition and plan of care.   Dustin Flock M.D on 07/28/2019 at 11:38 AM  Between 7am to 6pm - Pager - [phone]  After 6pm go to [email or web address] - Proofreader  [employer]   Office  [phone]

## 2019-07-28 NOTE — Evaluation (Signed)
Occupational Therapy Evaluation Patient Details Name: Lindsey Nguyen MRN: [id number] DOB: 1950-10-20 Today's Date: 07/28/2019    History of Present Illness 68yo female pt presented to ER secondary to progressive SOB x2 days; admitted for management of acute/chronic COPD exacerbation, noted with mild elevation in troponin (demand), covid negative.   Clinical Impression   Pt seen for OT evaluation this date. Pt was independent in all ADL and mobility, living in a 1 story home with a roommate who pt reports is becoming increasingly difficult to live with and who is unable/unwilling to assist pt if needed. (LCSW notified of questionable living situation). 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 Since then has been working from home which she does not enjoy 2/2 her roommate. Pt also endorses recently becoming easily fatigued or out of breath with minimize exertion. Pt currently requires CGA for functional mobility and sit<>stand ADL tasks due to poor activity tolerance and mild balance deficits. Pt educated in energy conservation conservation strategies including pursed lip breathing, activity pacing, home/routines modifications, work simplification, AE/DME, prioritizing of meaningful occupations, and falls prevention. Handout provided. Pt verbalized understanding but would benefit from additional skilled OT services to maximize recall and carryover of learned techniques and facilitate implementation of learned techniques into daily routines. Upon discharge, recommend [place] services. LCSW notified.     Follow Up Recommendations  Home health OT;Other (comment)(SW to assist with Medicare application, housing situation)    Equipment Recommendations  None recommended by OT    Recommendations for Other Services       Precautions / Restrictions Precautions Precautions: Fall Restrictions Weight Bearing  Restrictions: No      Mobility Bed Mobility Overal bed mobility: Modified Independent                Transfers Overall transfer level: Needs assistance Equipment used: None Transfers: Sit to/from Stand Sit to Stand: Min guard;Supervision         General transfer comment: fair/good LE strength and power, limited UE assist required    Balance Overall balance assessment: Needs assistance Sitting-balance support: No upper extremity supported;Feet supported Sitting balance-Leahy Scale: Good     Standing balance support: No upper extremity supported Standing balance-Leahy Scale: Fair                             ADL either performed or assessed with clinical judgement   ADL                                         General ADL Comments: CGA for STS ADL tasks and functional ADL transfers/mobility     Vision Baseline Vision/History: Wears glasses Wears Glasses: At all times Patient Visual Report: No change from baseline       Perception     Praxis      Pertinent Vitals/Pain Pain Assessment: No/denies pain     Hand Dominance     Extremity/Trunk Assessment Upper Extremity Assessment Upper Extremity Assessment: Overall WFL for tasks assessed   Lower Extremity Assessment Lower Extremity Assessment: Overall WFL for tasks assessed   Cervical / Trunk Assessment Cervical / Trunk Assessment: Normal   Communication Communication Communication: No difficulties   Cognition Arousal/Alertness: Awake/alert Behavior During Therapy: WFL for tasks assessed/performed Overall Cognitive Status: Within  Functional Limits for tasks assessed                                     General Comments       Exercises Other Exercises Other Exercises: Bed/chair transfer without assist device, cga/close sup; easily negotiates turns and room environment Other Exercises: Pt instructed in ECS with handout provided Other Exercises:  Emotional support and active listening provided with problem solving facilitation for pt regarding questionable/difficult roommate situation to improve her overall safety, health, and quality of life   Shoulder Instructions      Home Living Family/patient expects to be discharged to:: Private residence Living Arrangements: Non-relatives/Friends(questionable roommate situation - unable/unwilling to help pt) Available Help at Discharge: Family;Available PRN/intermittently(niece and her family could help but live in [place]) Type of Home: House Home Access: Stairs to enter [employer] of Steps: 3 Entrance Stairs-Rails: Right Home Layout: One level     Bathroom Shower/Tub: Teacher, early years/pre: Standard     Home Equipment: Shower seat          Prior Functioning/Environment Level of Independence: Independent        Comments: Indep with ADLs, light meal prep, household and limited community mobilization without assist device; no home O2.  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  Denies fall history.        OT Problem List: Decreased activity tolerance;Impaired balance (sitting and/or standing);Decreased knowledge of use of DME or AE      OT Treatment/Interventions: Self-care/ADL training;Therapeutic exercise;Therapeutic activities;Energy conservation;DME and/or AE instruction;Patient/family education;Balance training    OT Goals(Current goals can be found in the care plan section) Acute Rehab OT Goals Patient Stated Goal: to return home, breathe better, and figure out what I'm going to do next OT Goal Formulation: With patient Time For Goal Achievement: 08/11/19 Potential to Achieve Goals: Good ADL Goals Pt Will Perform Lower Body Dressing: with supervision;sit to/from stand;with adaptive equipment Pt Will Transfer to Toilet: with supervision;ambulating(LRAD for amb) Additional ADL Goal #1: Pt will verbalize plan to implement at least 2  learned energy conservation strategies.  OT Frequency: Min 1X/week   Barriers to D/C: Decreased caregiver support          Co-evaluation              AM-PAC OT "6 Clicks" Daily Activity     Outcome Measure Help from another person eating meals?: None Help from another person taking care of personal grooming?: None Help from another person toileting, which includes using toliet, bedpan, or urinal?: A Little Help from another person bathing (including washing, rinsing, drying)?: A Little Help from another person to put on and taking off regular upper body clothing?: None Help from another person to put on and taking off regular lower body clothing?: A Little 6 Click Score: 21   End of Session Nurse Communication: Other (comment)(reported home situation to LCSW)  Activity Tolerance: Patient tolerated treatment well Patient left: in chair;with call bell/phone within reach;with chair alarm set  OT Visit Diagnosis: Other abnormalities of gait and mobility (R26.89)                Time: [id number] OT Time Calculation (min): 45 min Charges:  OT General Charges $OT Visit: 1 Visit OT Evaluation $OT Eval Low Complexity: 1 Low OT Treatments $Self Care/Home Management : 38-52 mins  Jeni Salles, MPH, MS, OTR/L ascom [phone] 07/28/19, 4:28 PM
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
## 2019-07-28 NOTE — Consult Note (Signed)
Pulmonary Medicine          Date: 07/28/2019,   MRN# [id number] KENSLIE LOMBARDI 1951/05/28     AdmissionWeight: 31.8 kg                 CurrentWeight: 31.8 kg      CHIEF COMPLAINT:   Acute respiratory distress due to severe COPD exacerbation   HISTORY OF PRESENT ILLNESS   Is a pleasant 68 year old female with a history of right breast CA, advanced COPD, diabetes, essential hypertension, cachexia, she reports that over the last few weeks she had progressively been getting more short of breath.  She is reporting worsening to the point where she is only able to walk 1 rooms length at the house.  Previously at her baseline she is still working and high functioning able to take care of all activities of daily living.  She does have a lifelong smoking history and is still actively smoking 1 pack daily.  She reports that over the last few weeks she felt more fatigued and deconditioned this is likely due to Covid precautions as she has not been able to go to office for work and has had to be at home working which has caused her to smoke more and be more deconditioned additionally she reports that her roommate smokes up to 3 packs daily and the entire house is full of smoke causing her to be exposed to passive smoke throughout the day.  Pulmonary consultation was placed by hospitalist service Dr. Posey Pronto due to previous CT chest showing spiculated lesions concerning for possible malignancy with worsening respiratory status.  She had CT chest done which we reviewed together showing severe advanced bullous and centrilobular emphysema with multiple bilateral nodules some of which are over 1 cm in size.  Patient does report significant weight loss and currently is with a BMI that is 12.  She denies having hemoptysis, lumps in the axillary or groin region, night sweats fevers.   PAST MEDICAL HISTORY   Past Medical History:  Diagnosis Date   Cancer [facility])    right breast cancer with  lumpectomy and rad tx   COPD (chronic obstructive pulmonary disease) ([place])    Diabetes mellitus without complication ([place])    Hypertension    Osteoporosis      SURGICAL HISTORY   Past Surgical History:  Procedure Laterality Date   BREAST LUMPECTOMY     x 2   BUNIONECTOMY Right    COLONOSCOPY     normal   PERIPHERAL VASCULAR CATHETERIZATION N/A 04/10/2016   Procedure: Lower Extremity Angiography;  Surgeon: Katha Cabal, MD;  Location: [place] [facility];  Service: Cardiovascular;  Laterality: N/A;   PERIPHERAL VASCULAR CATHETERIZATION  04/10/2016   Procedure: Lower Extremity Intervention;  Surgeon: Katha Cabal, MD;  Location: [place] [facility];  Service: Cardiovascular;;   TUBAL LIGATION       FAMILY HISTORY   Family History  Problem Relation Age of Onset   Diabetes Father    Kidney cancer Father    Cancer Brother    Diabetes Paternal Aunt    Diabetes Paternal Uncle    Kidney failure Mother      SOCIAL HISTORY   Social History   Tobacco Use   Smoking status: Current Every Day Smoker    Packs/day: 1.00    Years: 50.00    Pack years: 50.00    Types: Cigarettes   Smokeless tobacco: Never Used  Substance Use Topics  Alcohol use: Yes    Alcohol/week: 21.0 standard drinks    Types: 14 Glasses of wine, 7 Cans of beer per week   Drug use: No     MEDICATIONS    Home Medication:    Current Medication:  Current Facility-Administered Medications:    albuterol (PROVENTIL) (2.5 MG/3ML) 0.083% nebulizer solution 2.5 mg, 2.5 mg, Nebulization, Q4H PRN, Dustin Flock, MD   [COMPLETED] azithromycin (ZITHROMAX) 500 mg in sodium chloride 0.9 % 250 mL IVPB, 500 mg, Intravenous, Q24H, Stopped at 07/28/19 0331 **FOLLOWED BY** azithromycin (ZITHROMAX) tablet 500 mg, 500 mg, Oral, Daily, Ouma, Bing Neighbors, NP   enoxaparin (LOVENOX) injection 30 mg, 30 mg, Subcutaneous, Q24H, Ouma, Bing Neighbors, NP, 30 mg at 07/28/19 0848    fluticasone furoate-vilanterol (BREO ELLIPTA) 200-25 MCG/INH 1 puff, 1 puff, Inhalation, Daily, Ouma, Bing Neighbors, NP, 1 puff at 07/28/19 0849   [START ON 07/29/2019] influenza vaccine adjuvanted (FLUAD) injection 0.5 mL, 0.5 mL, Intramuscular, Tomorrow-1000, Ouma, Bing Neighbors, NP   insulin aspart (novoLOG) injection 0-5 Units, 0-5 Units, Subcutaneous, QHS, Ouma, Bing Neighbors, NP   insulin aspart (novoLOG) injection 0-9 Units, 0-9 Units, Subcutaneous, TID WC, Ouma, Bing Neighbors, NP, 2 Units at 07/28/19 1641   ipratropium-albuterol (DUONEB) 0.5-2.5 (3) MG/3ML nebulizer solution 3 mL, 3 mL, Nebulization, TID, Dustin Flock, MD, 3 mL at 07/28/19 1325   lisinopril (ZESTRIL) tablet 10 mg, 10 mg, Oral, Daily, Ouma, Bing Neighbors, NP, 10 mg at 07/28/19 0847   loratadine (CLARITIN) tablet 10 mg, 10 mg, Oral, Daily, Ouma, Bing Neighbors, NP, 10 mg at 07/28/19 0847   [START ON 07/29/2019] multivitamin with minerals tablet 1 tablet, 1 tablet, Oral, Daily, Dustin Flock, MD   nicotine (NICODERM CQ - dosed in mg/24 hours) patch 21 mg, 21 mg, Transdermal, Daily, Dustin Flock, MD, 21 mg at 07/28/19 [id number]   predniSONE (DELTASONE) tablet 40 mg, 40 mg, Oral, Q breakfast, Ouma, Bing Neighbors, NP, 40 mg at 07/28/19 0847    ALLERGIES   Tetanus toxoids     REVIEW OF SYSTEMS    Review of Systems:  Gen:  Denies  fever, sweats, chills weigh loss  HEENT: Denies blurred vision, double vision, ear pain, eye pain, hearing loss, nose bleeds, sore throat Cardiac:  No dizziness, chest pain or heaviness, chest tightness,edema Resp:   Denies cough or sputum porduction, shortness of breath,wheezing, hemoptysis,  Gi: Denies swallowing difficulty, stomach pain, nausea or vomiting, diarrhea, constipation, bowel incontinence Gu:  Denies bladder incontinence, burning urine Ext:   Denies Joint pain, stiffness or swelling Skin: Denies  skin rash, easy bruising or bleeding or  hives Endoc:  Denies polyuria, polydipsia , polyphagia or weight change Psych:   Denies depression, insomnia or hallucinations   Other:  All other systems negative   VS: BP 115/70    Pulse 98    Temp 98 F (36.7 C) (Oral)    Resp 20    Ht 5\' 4"  (1.626 m)    Wt 31.8 kg    SpO2 96%    BMI 12.02 kg/m      PHYSICAL EXAM    GENERAL:NAD, no fevers, chills, no weakness no fatigue HEAD: Normocephalic, atraumatic.  EYES: Pupils equal, round, reactive to light. Extraocular muscles intact. No scleral icterus.  MOUTH: Moist mucosal membrane. Dentition intact. No abscess noted.  EAR, NOSE, THROAT: Clear without exudates. No external lesions.  NECK: Supple. No thyromegaly. No nodules. No JVD.  PULMONARY: Mild rhonchorous breath sounds bilaterally CARDIOVASCULAR: S1 and S2. Regular rate and  rhythm. No murmurs, rubs, or gallops. No edema. Pedal pulses 2+ bilaterally.  GASTROINTESTINAL: Soft, nontender, nondistended. No masses. Positive bowel sounds. No hepatosplenomegaly.  MUSCULOSKELETAL: No swelling, clubbing, or edema. Range of motion full in all extremities.  NEUROLOGIC: Cranial nerves II through XII are intact. No gross focal neurological deficits. Sensation intact. Reflexes intact.  SKIN: No ulceration, lesions, rashes, or cyanosis. Skin warm and dry. Turgor intact.  PSYCHIATRIC: Mood, affect within normal limits. The patient is awake, alert and oriented x 3. Insight, judgment intact.       IMAGING    Ct Chest W Contrast  Result Date: 07/28/2019 CLINICAL DATA:  Shortness of breath, acute on chronic COPD exacerbation. EXAM: CT CHEST WITH CONTRAST TECHNIQUE: Multidetector CT imaging of the chest was performed during intravenous contrast administration. CONTRAST:  84mL OMNIPAQUE IOHEXOL 300 MG/ML  SOLN COMPARISON:  10/17/2018 FINDINGS: Cardiovascular: Heart is normal size. Aorta normal caliber with moderate atherosclerosis. Mediastinum/Nodes: No mediastinal, hilar, or axillary adenopathy.  Trachea and esophagus are unremarkable. Thyroid unremarkable. Lungs/Pleura: Severe centrilobular emphysema. Previously seen suspicious 8 x 7 mm right upper lobe nodule has improved with maximum diameter currently 4 mm. New triangular nodule in the posterior right upper lobe measures 10 x 7 mm on image 42 of series 3. Enlarging left upper lobe nodule currently measures 9 x 8 mm compared to 5 mm previously. New left upper lobe nodule measures 4 mm on image 39. No effusions or confluent airspace opacities. Upper Abdomen: Imaging into the upper abdomen shows no acute findings. Musculoskeletal: Chest wall soft tissues are unremarkable. No acute bony abnormality. IMPRESSION: Decreasing size of the previously seen right upper lobe nodule, now 4 mm compared 8 mm previously. The previously seen 5 mm left upper lobe nodule has enlarged, now measuring up to 9 mm and a 2nd right upper lobe nodule is noted, measuring up to 10 mm. This superimposed on a background of severe centrilobular emphysema. Additional small 4 mm left upper lobe nodule. Cannot exclude primary lung cancer or metastases. Recommend close interval follow-up with repeat CT in 3-6 months or further evaluation with PET CT. Aortic Atherosclerosis (ICD10-I70.0) and Emphysema (ICD10-J43.9). Electronically Signed   By: Rolm Baptise M.D.   On: 07/28/2019 13:42   Dg Chest Portable 1 View  Result Date: 07/27/2019 CLINICAL DATA:  Shortness of breath for 2 days EXAM: PORTABLE CHEST 1 VIEW COMPARISON:  04/08/2018 FINDINGS: Cardiac shadows within normal limits. The lungs are hyperinflated consistent with COPD. No focal infiltrate or sizable effusion is noted. No bony abnormality is seen. IMPRESSION: COPD without acute abnormality. Electronically Signed   By: Inez Catalina M.D.   On: 07/27/2019 21:09      IMPRESSION: Decreasing size of the previously seen right upper lobe nodule, now 4 mm compared 8 mm previously. The previously seen 5 mm left upper lobe nodule  has enlarged, now measuring up to 9 mm and a 2nd right upper lobe nodule is noted, measuring up to 10 mm. This superimposed on a background of severe centrilobular emphysema. Additional small 4 mm left upper lobe nodule. Cannot exclude primary lung cancer or metastases. Recommend close interval follow-up with repeat CT in 3-6 months or further evaluation with PET CT.  Aortic Atherosclerosis (ICD10-I70.0) and Emphysema (ICD10-J43.9).   Electronically Signed   ASSESSMENT/PLAN   Severe acute COPD exacerbation  -Patient has clinically improved  -Continue COPD care   -Agree with prednisone taper   -Patient will need pulmonary rehab   -Outpatient pulmonary evaluation post discharge  Enlarging pulmonary nodules  -10 mm right upper lobe and 9 mm left upper lobe on current CT chest  -High risk for malignancy  -Likely need PET/CT with additional discussion regarding risks of biopsy.   Tobacco abuse  -Currently smoking at least 1 pack daily  -Smoking cessation counseling provided today for over 10 minutes     Thank you Dr. Posey Pronto for allowing me to participate in the care of this patient.  Patient/Family are satisfied with care plan and all questions have been answered.  This document was prepared using Dragon voice recognition software and may include unintentional dictation errors.     Ottie Glazier, M.D.  Division of [place]

## 2019-07-28 NOTE — Progress Notes (Signed)
Advanced care plan.  Purpose of the Encounter: CODE STATUS  Parties in Attendance: Patient herself  Patient's Decision Capacity: Intact  Subjective/Patient's story:  Patient is 68 year old with history of severe COPD, essential hypertension, diabetes type 2, COPD presenting with worsening shortness of breath  Objective/Medical story I discussed with the patient regarding her desires for cardiac and pulmonary resuscitation   Goals of care determination:   Patient states that she is not sure.  She wants to think about her decision.  Wants to be a full code for now  CODE STATUS: Full code   Time spent discussing advanced care planning: 16 minutes

## 2019-07-29 DIAGNOSIS — E43 Unspecified severe protein-calorie malnutrition: Secondary | ICD-10-CM | POA: Insufficient documentation

## 2019-07-29 LAB — GLUCOSE, CAPILLARY
Glucose-Capillary: 93 mg/dL (ref 70–99)
Glucose-Capillary: 125 mg/dL — ABNORMAL HIGH (ref 70–99)

## 2019-07-29 MED ORDER — PREDNISONE 10 MG (21) PO TBPK: ORAL_TABLET | ORAL | 0 refills | Status: DC

## 2019-07-29 MED ORDER — TIOTROPIUM BROMIDE MONOHYDRATE 18 MCG IN CAPS
18.0000 ug | ORAL_CAPSULE | Freq: Every morning | RESPIRATORY_TRACT | Status: DC
  Administered 2019-07-29: 18 ug via RESPIRATORY_TRACT
  Filled 2019-07-29: qty 5

## 2019-07-29 MED ORDER — AZITHROMYCIN 500 MG PO TABS: 500.0000 mg | ORAL_TABLET | Freq: Every day | ORAL | 0 refills | Status: AC

## 2019-07-29 NOTE — Progress Notes (Signed)
Pulmonary Medicine          Date: 07/29/2019,   MRN# [id number] Lindsey Nguyen [birth year]     AdmissionWeight: 31.8 kg                 CurrentWeight: 31.8 kg      CHIEF COMPLAINT:   Acute respiratory distress due to severe COPD exacerbation   SUBJECTIVE    Patient is close to baseline. OK for dc home and close follow up on outpatient with [facility] pulmonary.    PAST MEDICAL HISTORY   Past Medical History:  Diagnosis Date   Cancer [facility])    right breast cancer with lumpectomy and rad tx   COPD (chronic obstructive pulmonary disease) ([place])    Diabetes mellitus without complication ([place])    Hypertension    Osteoporosis      SURGICAL HISTORY   Past Surgical History:  Procedure Laterality Date   BREAST LUMPECTOMY     x 2   BUNIONECTOMY Right    COLONOSCOPY     normal   PERIPHERAL VASCULAR CATHETERIZATION N/A 04/10/2016   Procedure: Lower Extremity Angiography;  Surgeon: Katha Cabal, MD;  Location: [place] [facility];  Service: Cardiovascular;  Laterality: N/A;   PERIPHERAL VASCULAR CATHETERIZATION  04/10/2016   Procedure: Lower Extremity Intervention;  Surgeon: Katha Cabal, MD;  Location: [place] [facility];  Service: Cardiovascular;;   TUBAL LIGATION       FAMILY HISTORY   Family History  Problem Relation Age of Onset   Diabetes Father    Kidney cancer Father    Cancer Brother    Diabetes Paternal Aunt    Diabetes Paternal Uncle    Kidney failure Mother      SOCIAL HISTORY   Social History   Tobacco Use   Smoking status: Current Every Day Smoker    Packs/day: 1.00    Years: 50.00    Pack years: 50.00    Types: Cigarettes   Smokeless tobacco: Never Used  Substance Use Topics   Alcohol use: Yes    Alcohol/week: 21.0 standard drinks    Types: 14 Glasses of wine, 7 Cans of beer per week   Drug use: No     MEDICATIONS    Home Medication:    Current Medication:  Current Facility-Administered  Medications:    albuterol (PROVENTIL) (2.5 MG/3ML) 0.083% nebulizer solution 2.5 mg, 2.5 mg, Nebulization, Q4H PRN, Dustin Flock, MD   [COMPLETED] azithromycin (ZITHROMAX) 500 mg in sodium chloride 0.9 % 250 mL IVPB, 500 mg, Intravenous, Q24H, Stopped at 07/28/19 0331 **FOLLOWED BY** azithromycin (ZITHROMAX) tablet 500 mg, 500 mg, Oral, Daily, Ouma, Bing Neighbors, NP, 500 mg at 07/29/19 0949   enoxaparin (LOVENOX) injection 30 mg, 30 mg, Subcutaneous, Q24H, Ouma, Bing Neighbors, NP, 30 mg at 07/29/19 0949   fluticasone furoate-vilanterol (BREO ELLIPTA) 200-25 MCG/INH 1 puff, 1 puff, Inhalation, Daily, Ouma, Bing Neighbors, NP, 1 puff at 07/29/19 0950   influenza vaccine adjuvanted (FLUAD) injection 0.5 mL, 0.5 mL, Intramuscular, Tomorrow-1000, Ouma, Bing Neighbors, NP   insulin aspart (novoLOG) injection 0-5 Units, 0-5 Units, Subcutaneous, QHS, Ouma, Bing Neighbors, NP   insulin aspart (novoLOG) injection 0-9 Units, 0-9 Units, Subcutaneous, TID WC, Ouma, Bing Neighbors, NP, 2 Units at 07/28/19 1641   lisinopril (ZESTRIL) tablet 10 mg, 10 mg, Oral, Daily, Ouma, Bing Neighbors, NP, 10 mg at 07/29/19 0949   loratadine (CLARITIN) tablet 10 mg, 10 mg, Oral, Daily, Ouma, Bing Neighbors, NP, 10 mg  at 07/29/19 0949   multivitamin with minerals tablet 1 tablet, 1 tablet, Oral, Daily, Dustin Flock, MD, 1 tablet at 07/29/19 [id number]   nicotine (NICODERM CQ - dosed in mg/24 hours) patch 21 mg, 21 mg, Transdermal, Daily, Dustin Flock, MD, 21 mg at 07/29/19 [id number]   predniSONE (DELTASONE) tablet 40 mg, 40 mg, Oral, Q breakfast, Ouma, Bing Neighbors, NP, 40 mg at 07/29/19 0949   tiotropium [facility]) inhalation capsule ([facility] use ONLY) 18 mcg, 18 mcg, Inhalation, q morning - 10a, Sudini, Srikar, MD, 18 mcg at 07/29/19 1124    ALLERGIES   Tetanus toxoids     REVIEW OF SYSTEMS    Review of Systems:  Gen:  Denies  fever, sweats, chills weigh loss  HEENT: Denies  blurred vision, double vision, ear pain, eye pain, hearing loss, nose bleeds, sore throat Cardiac:  No dizziness, chest pain or heaviness, chest tightness,edema Resp:   Denies cough or sputum porduction, shortness of breath,wheezing, hemoptysis,  Gi: Denies swallowing difficulty, stomach pain, nausea or vomiting, diarrhea, constipation, bowel incontinence Gu:  Denies bladder incontinence, burning urine Ext:   Denies Joint pain, stiffness or swelling Skin: Denies  skin rash, easy bruising or bleeding or hives Endoc:  Denies polyuria, polydipsia , polyphagia or weight change Psych:   Denies depression, insomnia or hallucinations   Other:  All other systems negative   VS: BP (!) 156/98 (BP Location: Left Arm)    Pulse (!) 107    Temp 98.5 F (36.9 C)    Resp 18    Ht 5\' 4"  (1.626 m)    Wt 31.8 kg    SpO2 96% Comment: walking   BMI 12.02 kg/m      PHYSICAL EXAM    GENERAL:NAD, no fevers, chills, no weakness no fatigue HEAD: Normocephalic, atraumatic.  EYES: Pupils equal, round, reactive to light. Extraocular muscles intact. No scleral icterus.  MOUTH: Moist mucosal membrane. Dentition intact. No abscess noted.  EAR, NOSE, THROAT: Clear without exudates. No external lesions.  NECK: Supple. No thyromegaly. No nodules. No JVD.  PULMONARY: Mild rhonchorous breath sounds bilaterally CARDIOVASCULAR: S1 and S2. Regular rate and rhythm. No murmurs, rubs, or gallops. No edema. Pedal pulses 2+ bilaterally.  GASTROINTESTINAL: Soft, nontender, nondistended. No masses. Positive bowel sounds. No hepatosplenomegaly.  MUSCULOSKELETAL: No swelling, clubbing, or edema. Range of motion full in all extremities.  NEUROLOGIC: Cranial nerves II through XII are intact. No gross focal neurological deficits. Sensation intact. Reflexes intact.  SKIN: No ulceration, lesions, rashes, or cyanosis. Skin warm and dry. Turgor intact.  PSYCHIATRIC: Mood, affect within normal limits. The patient is awake, alert and  oriented x 3. Insight, judgment intact.       IMAGING    Ct Chest W Contrast  Result Date: 07/28/2019 CLINICAL DATA:  Shortness of breath, acute on chronic COPD exacerbation. EXAM: CT CHEST WITH CONTRAST TECHNIQUE: Multidetector CT imaging of the chest was performed during intravenous contrast administration. CONTRAST:  69mL OMNIPAQUE IOHEXOL 300 MG/ML  SOLN COMPARISON:  10/17/2018 FINDINGS: Cardiovascular: Heart is normal size. Aorta normal caliber with moderate atherosclerosis. Mediastinum/Nodes: No mediastinal, hilar, or axillary adenopathy. Trachea and esophagus are unremarkable. Thyroid unremarkable. Lungs/Pleura: Severe centrilobular emphysema. Previously seen suspicious 8 x 7 mm right upper lobe nodule has improved with maximum diameter currently 4 mm. New triangular nodule in the posterior right upper lobe measures 10 x 7 mm on image 42 of series 3. Enlarging left upper lobe nodule currently measures 9 x 8 mm compared to 5 mm previously.  New left upper lobe nodule measures 4 mm on image 39. No effusions or confluent airspace opacities. Upper Abdomen: Imaging into the upper abdomen shows no acute findings. Musculoskeletal: Chest wall soft tissues are unremarkable. No acute bony abnormality. IMPRESSION: Decreasing size of the previously seen right upper lobe nodule, now 4 mm compared 8 mm previously. The previously seen 5 mm left upper lobe nodule has enlarged, now measuring up to 9 mm and a 2nd right upper lobe nodule is noted, measuring up to 10 mm. This superimposed on a background of severe centrilobular emphysema. Additional small 4 mm left upper lobe nodule. Cannot exclude primary lung cancer or metastases. Recommend close interval follow-up with repeat CT in 3-6 months or further evaluation with PET CT. Aortic Atherosclerosis (ICD10-I70.0) and Emphysema (ICD10-J43.9). Electronically Signed   By: Rolm Baptise M.D.   On: 07/28/2019 13:42   Dg Chest Portable 1 View  Result Date:  07/27/2019 CLINICAL DATA:  Shortness of breath for 2 days EXAM: PORTABLE CHEST 1 VIEW COMPARISON:  04/08/2018 FINDINGS: Cardiac shadows within normal limits. The lungs are hyperinflated consistent with COPD. No focal infiltrate or sizable effusion is noted. No bony abnormality is seen. IMPRESSION: COPD without acute abnormality. Electronically Signed   By: Inez Catalina M.D.   On: 07/27/2019 21:09      IMPRESSION: Decreasing size of the previously seen right upper lobe nodule, now 4 mm compared 8 mm previously. The previously seen 5 mm left upper lobe nodule has enlarged, now measuring up to 9 mm and a 2nd right upper lobe nodule is noted, measuring up to 10 mm. This superimposed on a background of severe centrilobular emphysema. Additional small 4 mm left upper lobe nodule. Cannot exclude primary lung cancer or metastases. Recommend close interval follow-up with repeat CT in 3-6 months or further evaluation with PET CT.  Aortic Atherosclerosis (ICD10-I70.0) and Emphysema (ICD10-J43.9).   Electronically Signed   ASSESSMENT/PLAN   Severe acute COPD exacerbation  -Patient has clinically improved  -Continue COPD care   -Agree with prednisone taper   -Patient will need pulmonary rehab   -Outpatient pulmonary evaluation post discharge  Enlarging pulmonary nodules  -10 mm right upper lobe and 9 mm left upper lobe on current CT chest  -High risk for malignancy  -Likely need PET/CT with additional discussion regarding risks of biopsy.   Tobacco abuse  -Currently smoking at least 1 pack daily  -Smoking cessation counseling provided today for over 10 minutes     Thank you Dr. Posey Pronto for allowing me to participate in the care of this patient.  Patient/Family are satisfied with care plan and all questions have been answered.  This document was prepared using Dragon voice recognition software and may include unintentional dictation errors.     Ottie Glazier, M.D.  Division of  [place]

## 2019-07-29 NOTE — Discharge Instructions (Signed)
Resume diet and activity as before ° ° °

## 2019-07-29 NOTE — Progress Notes (Signed)
Discharge order received. Patient mental status is at baseline. Vital signs stable . No signs of acute distress. Discharge instructions given. Patient verbalized understanding. No other issues noted at this time.   

## 2019-08-01 NOTE — Discharge Summary (Signed)
[facility] [place] NAME: Lindsey Nguyen    MR#:  [id number]  DATE OF BIRTH:  1951/09/24  DATE OF ADMISSION:  07/27/2019 ADMITTING PHYSICIAN: Lang Snow, NP  DATE OF DISCHARGE: 07/29/2019  2:40 PM  PRIMARY CARE PHYSICIAN: Juline Patch, MD   ADMISSION DIAGNOSIS:  COPD exacerbation ([place]) [J44.1] COPD with acute exacerbation ([place]) [J44.1]  DISCHARGE DIAGNOSIS:  Active Problems:   COPD with acute exacerbation ([place])   Protein-calorie malnutrition, severe   SECONDARY DIAGNOSIS:   Past Medical History:  Diagnosis Date  . Cancer [facility])    right breast cancer with lumpectomy and rad tx  . COPD (chronic obstructive pulmonary disease) ([place])   . Diabetes mellitus without complication ([place])   . Hypertension   . Osteoporosis      ADMITTING HISTORY  HISTORY OF PRESENT ILLNESS:  68 y.o. female with pertinent past medical history of right breast cancer, COPD, tobacco abuse, diabetes mellitus, hypertension, and osteoporosis presenting to the ED with chief complaints of worsening shortness of breath.  Patient report onset of shortness of breath since 2 days ago associated with wheezing and productive cough.  She does admit to smoking 10 to 12 cigarettes today prior to onset of symptoms.  Patient states that she does not use oxygen at home.  Denies associated symptoms of fevers or chills, chest pain, diaphoresis, nausea vomiting or recent sick contacts.   On arrival to the ED, she was afebrile with blood pressure 172/100 mm Hg and pulse rate 115 beats/min. There were no focal neurological deficits; she was alert and oriented x4, with significant increased work of breathing.  Initial labs revealed unremarkable CBC, sodium 132, glucose 149, anion gap 17, troponin 29, repeat 43, Covid 19 negative. Chest xray shows hyperinflated lungs consistent with COPD. Patient received steroids and Duonebs in the ED with significant improvement in her  breathing noted.   HOSPITAL COURSE:   68 y.o.femaleith pertinent past medical history of right breast cancer, COPD, tobacco abuse, diabetes mellitus, hypertension, and osteoporosis presenting to the ED with chief complaints of worsening shortness of breath.  1.Acuteon chronicCOPD Exacerbation- Treated with IV steroids and nebulizers.  CT scan of the chest was done which shows large nodules.  Follow-up as outpatient with pulmonary set up for further treatment and possible PET scan.   2. Elevated troponin- Likely demand ischemia. Denies chest pain - Trend troponin  3.Diabetes mellitus -Continue sliding scale insulin   4.Hypertension -Continue lisinopril  5.Tobacco abuse-smoking cessation counseling provided -Nicotine patch offered  6.DVT prophylaxis - Enoxaparin SubQin the hospital  Stable for discharge home  CONSULTS OBTAINED:  Treatment Team:  Ottie Glazier, MD  DRUG ALLERGIES:   Allergies  Allergen Reactions  . Tetanus Toxoids Swelling    DISCHARGE MEDICATIONS:   Allergies as of 07/29/2019      Reactions   Tetanus Toxoids Swelling      Medication List    TAKE these medications   albuterol 108 (90 Base) MCG/ACT inhaler Commonly known as: VENTOLIN HFA Inhale 2 puffs into the lungs every 6 (six) hours as needed for wheezing or shortness of breath.   azithromycin 500 MG tablet Commonly known as: Zithromax Take 1 tablet (500 mg total) by mouth daily for 4 days. Take 1 tablet daily for 3 days.   Breo Ellipta 200-25 MCG/INH Aepb Generic drug: fluticasone furoate-vilanterol Inhale 1 puff into the lungs daily.   lisinopril 10 MG tablet Commonly known as: ZESTRIL Take 1 tablet (10  mg total) by mouth daily.   loratadine 10 MG tablet Commonly known as: CLARITIN Take 1 tablet (10 mg total) by mouth daily.   metFORMIN 500 MG tablet Commonly known as: GLUCOPHAGE Take 1 tablet (500 mg total) by mouth daily.   predniSONE 10 MG (21) Tbpk  tablet Commonly known as: STERAPRED UNI-PAK 21 TAB 6 tabs day 1 and taper 10 mg a day - 6 days   Spiriva HandiHaler 18 MCG inhalation capsule Generic drug: tiotropium Place 18 mcg into inhaler and inhale daily. pulm       Today   VITAL SIGNS:  Blood pressure (!) 156/98, pulse (!) 107, temperature 98.5 F (36.9 C), resp. rate 18, height 5\' 4"  (1.626 m), weight 31.8 kg, SpO2 96 %.  I/O:  No intake or output data in the 24 hours ending 08/01/19 1257  PHYSICAL EXAMINATION:  Physical Exam  GENERAL:  68 y.o.-year-old patient lying in the bed with no acute distress.  LUNGS: Normal breath sounds bilaterally, no wheezing, rales,rhonchi or crepitation. No use of accessory muscles of respiration.  CARDIOVASCULAR: S1, S2 normal. No murmurs, rubs, or gallops.  ABDOMEN: Soft, non-tender, non-distended. Bowel sounds present. No organomegaly or mass.  NEUROLOGIC: Moves all 4 extremities. PSYCHIATRIC: The patient is alert and oriented x 3.  SKIN: No obvious rash, lesion, or ulcer.   DATA REVIEW:   CBC Recent Labs  Lab 07/28/19 0441  WBC 5.8  HGB 14.2  HCT 41.6  PLT 257    Chemistries  Recent Labs  Lab 07/28/19 0441  NA 133*  K 4.1  CL 95*  CO2 25  GLUCOSE 172*  BUN 20  CREATININE 0.44  CALCIUM 9.5    Cardiac Enzymes No results for input(s): TROPONINI in the last 168 hours.  Microbiology Results  Results for orders placed or performed during the hospital encounter of 07/27/19  SARS Coronavirus 2 by RT PCR (hospital order, performed in [facility] hospital lab) Nasopharyngeal Nasopharyngeal Swab     Status: None   Collection Time: 07/27/19  9:38 PM   Specimen: Nasopharyngeal Swab  Result Value Ref Range Status   SARS Coronavirus 2 NEGATIVE NEGATIVE Final    Comment: (NOTE) If result is NEGATIVE SARS-CoV-2 target nucleic acids are NOT DETECTED. The SARS-CoV-2 RNA is generally detectable in upper and lower  respiratory specimens during the acute phase of infection.  The lowest  concentration of SARS-CoV-2 viral copies this assay can detect is 250  copies / mL. A negative result does not preclude SARS-CoV-2 infection  and should not be used as the sole basis for treatment or other  patient management decisions.  A negative result may occur with  improper specimen collection / handling, submission of specimen other  than nasopharyngeal swab, presence of viral mutation(s) within the  areas targeted by this assay, and inadequate number of viral copies  (<250 copies / mL). A negative result must be combined with clinical  observations, patient history, and epidemiological information. If result is POSITIVE SARS-CoV-2 target nucleic acids are DETECTED. The SARS-CoV-2 RNA is generally detectable in upper and lower  respiratory specimens dur ing the acute phase of infection.  Positive  results are indicative of active infection with SARS-CoV-2.  Clinical  correlation with patient history and other diagnostic information is  necessary to determine patient infection status.  Positive results do  not rule out bacterial infection or co-infection with other viruses. If result is PRESUMPTIVE POSTIVE SARS-CoV-2 nucleic acids MAY BE PRESENT.   A presumptive positive  result was obtained on the submitted specimen  and confirmed on repeat testing.  While 2019 novel coronavirus  (SARS-CoV-2) nucleic acids may be present in the submitted sample  additional confirmatory testing may be necessary for epidemiological  and / or clinical management purposes  to differentiate between  SARS-CoV-2 and other Sarbecovirus currently known to infect humans.  If clinically indicated additional testing with an alternate test  methodology [phone]) is advised. The SARS-CoV-2 RNA is generally  detectable in upper and lower respiratory sp ecimens during the acute  phase of infection. The expected result is Negative. Fact Sheet for Patients:   StrictlyIdeas.no Fact Sheet for Healthcare Providers: BankingDealers.co.za This test is not yet approved or cleared by the [place] FDA and has been authorized for detection and/or diagnosis of SARS-CoV-2 by FDA under an Emergency Use Authorization (EUA).  This EUA will remain in effect (meaning this test can be used) for the duration of the COVID-19 declaration under Section 564(b)(1) of the Act, 21 U.S.C. section 360bbb-3(b)(1), unless the authorization is terminated or revoked sooner. Performed at [facility], [address] [place], [place] [address]     RADIOLOGY:  No results found.  Follow up with PCP in 1 week.  Management plans discussed with the patient, family and they are in agreement.  CODE STATUS:  Code Status History    Date Active Date Inactive Code Status Order ID Comments User Context   07/27/2019 2313 07/29/2019 1740 Full Code [id number]  Lang Snow, NP ED   10/28/2017 2052 10/31/2017 1734 Full Code [id number]  Henreitta Leber, MD Inpatient   04/10/2016 1217 04/10/2016 1935 Full Code [id number]  Schnier, Dolores Lory, MD Inpatient   Advance Care Planning Activity      TOTAL TIME TAKING CARE OF THIS PATIENT ON DAY OF DISCHARGE: more than 30 minutes.   Leia Alf Kateleen Encarnacion M.D on 08/01/2019 at 12:57 PM  Between 7am to 6pm - Pager - [phone]  After 6pm go to [email or web address] - password [email or web address] [facility]  Office  [phone]  CC: Primary care physician; Juline Patch, MD  Note: This dictation was prepared with Dragon dictation along with smaller phrase technology. Any transcriptional errors that result from this process are unintentional.

## 2019-09-01 ENCOUNTER — Other Ambulatory Visit: Payer: Self-pay | Admitting: Specialist

## 2019-09-01 DIAGNOSIS — R918 Other nonspecific abnormal finding of lung field: Secondary | ICD-10-CM

## 2019-09-09 ENCOUNTER — Encounter
Admission: RE | Admit: 2019-09-09 | Discharge: 2019-09-09 | Disposition: A | Discharge status: Home / Self Care | Payer: 59 | Source: Ambulatory Visit | Attending: Specialist | Admitting: Specialist

## 2019-09-09 ENCOUNTER — Other Ambulatory Visit: Payer: Self-pay

## 2019-09-09 DIAGNOSIS — R918 Other nonspecific abnormal finding of lung field: Secondary | ICD-10-CM

## 2019-09-09 LAB — GLUCOSE, CAPILLARY: Glucose-Capillary: 77 mg/dL (ref 70–99)

## 2019-09-09 MED ORDER — FLUDEOXYGLUCOSE F - 18 (FDG) INJECTION
5.2000 | Freq: Once | INTRAVENOUS | Status: AC | PRN
  Administered 2019-09-09: 12:00:00 5.44 via INTRAVENOUS

## 2019-09-29 ENCOUNTER — Encounter: Payer: Self-pay | Admitting: Family Medicine

## 2019-09-29 ENCOUNTER — Other Ambulatory Visit: Payer: Self-pay

## 2019-09-29 ENCOUNTER — Ambulatory Visit (INDEPENDENT_AMBULATORY_CARE_PROVIDER_SITE_OTHER): Payer: 59 | Admitting: Family Medicine

## 2019-09-29 VITALS — BP 150/80 | HR 100 | Wt <= 1120 oz

## 2019-09-29 DIAGNOSIS — I872 Venous insufficiency (chronic) (peripheral): Secondary | ICD-10-CM | POA: Diagnosis not present

## 2019-09-29 DIAGNOSIS — I10 Essential (primary) hypertension: Secondary | ICD-10-CM | POA: Diagnosis not present

## 2019-09-29 DIAGNOSIS — R7303 Prediabetes: Secondary | ICD-10-CM

## 2019-09-29 DIAGNOSIS — J432 Centrilobular emphysema: Secondary | ICD-10-CM | POA: Diagnosis not present

## 2019-09-29 DIAGNOSIS — F17213 Nicotine dependence, cigarettes, with withdrawal: Secondary | ICD-10-CM | POA: Diagnosis not present

## 2019-09-29 DIAGNOSIS — Z23 Encounter for immunization: Secondary | ICD-10-CM | POA: Diagnosis not present

## 2019-09-29 MED ORDER — ALBUTEROL SULFATE HFA 108 (90 BASE) MCG/ACT IN AERS: 2.0000 | INHALATION_SPRAY | Freq: Four times a day (QID) | RESPIRATORY_TRACT | 3 refills | Status: DC | PRN

## 2019-09-29 MED ORDER — METFORMIN HCL 500 MG PO TABS: 500.0000 mg | ORAL_TABLET | Freq: Every day | ORAL | 1 refills | Status: DC

## 2019-09-29 MED ORDER — PREDNISONE 10 MG PO TABS: 10.0000 mg | ORAL_TABLET | Freq: Every day | ORAL | 5 refills | Status: DC

## 2019-09-29 MED ORDER — LISINOPRIL 10 MG PO TABS: 10.0000 mg | ORAL_TABLET | Freq: Two times a day (BID) | ORAL | 1 refills | Status: DC

## 2019-09-29 NOTE — Progress Notes (Signed)
Date:  09/29/2019   Name:  Lindsey Nguyen   DOB:  Mar 10, 1951   MRN:  [id number]   Chief Complaint: Hypertension, Joint Swelling (L) ankle swelling- sitting alot for work- down in the am and swells by the end of the day), and Diabetes  Patient having left ankle swelling in evening. Relieved with elevation.  Hypertension This is a chronic problem. The current episode started more than 1 year ago. The problem has been waxing and waning since onset. The problem is uncontrolled. Pertinent negatives include no anxiety, blurred vision, chest pain, headaches, malaise/fatigue, neck pain, orthopnea, palpitations, peripheral edema, PND, shortness of breath or sweats. There are no associated agents to hypertension. Risk factors for coronary artery disease include smoking/tobacco exposure. Past treatments include ACE inhibitors. The current treatment provides moderate improvement. There are no compliance problems.  There is no history of angina, kidney disease, CAD/MI, CVA, heart failure, left ventricular hypertrophy, PVD or retinopathy.  Diabetes She presents for her follow-up diabetic visit. She has type 2 diabetes mellitus. Her disease course has been stable. Pertinent negatives for hypoglycemia include no dizziness, headaches, nervousness/anxiousness or sweats. Associated symptoms include fatigue and weight loss. Pertinent negatives for diabetes include no blurred vision, no chest pain, no foot paresthesias, no foot ulcerations, no polydipsia, no polyphagia, no polyuria, no visual change and no weakness. There are no hypoglycemic complications. Symptoms are stable. Pertinent negatives for diabetic complications include no autonomic neuropathy, CVA, heart disease, impotence, nephropathy, peripheral neuropathy, PVD or retinopathy. Current diabetic treatment includes oral agent (monotherapy). Diabetic current diet: tea and coffee/peanut butter on toast. Exercise: cannot. Home blood sugar record trend: does not  check. An ACE inhibitor/angiotensin II receptor blocker is being taken.  Shortness of Breath This is a chronic (for copd) problem. The current episode started more than 1 year ago. The problem occurs constantly. Pertinent negatives include no abdominal pain, chest pain, claudication, ear pain, fever, headaches, leg pain, leg swelling, neck pain, orthopnea, PND, rash, sore throat or wheezing. The symptoms are aggravated by any activity. She has tried beta agonist inhalers and oral steroids for the symptoms. There is no history of a heart failure.    Lab Results  Component Value Date   CREATININE 0.44 07/28/2019   BUN 20 07/28/2019   NA 133 (L) 07/28/2019   K 4.1 07/28/2019   CL 95 (L) 07/28/2019   CO2 25 07/28/2019   Lab Results  Component Value Date   CHOL 289 (H) 11/18/2015   HDL 109 11/18/2015   LDLCALC 142 (H) 11/18/2015   TRIG 192 (H) 11/18/2015   CHOLHDL 2.7 11/18/2015   Lab Results  Component Value Date   TSH 0.986 09/26/2017   Lab Results  Component Value Date   HGBA1C 6.0 (H) 07/28/2019     Review of Systems  Constitutional: Positive for fatigue and weight loss. Negative for chills, fever and malaise/fatigue.  HENT: Negative for drooling, ear discharge, ear pain and sore throat.   Eyes: Negative for blurred vision.  Respiratory: Negative for cough, chest tightness, shortness of breath and wheezing.   Cardiovascular: Negative for chest pain, palpitations, orthopnea, claudication, leg swelling and PND.  Gastrointestinal: Negative for abdominal pain, blood in stool, constipation, diarrhea and nausea.  Endocrine: Negative for polydipsia, polyphagia and polyuria.  Genitourinary: Negative for dysuria, frequency, hematuria, impotence and urgency.  Musculoskeletal: Negative for back pain, myalgias and neck pain.  Skin: Negative for rash.  Allergic/Immunologic: Negative for environmental allergies.  Neurological: Negative for dizziness, weakness  and headaches.    Hematological: Does not bruise/bleed easily.  Psychiatric/Behavioral: Negative for suicidal ideas. The patient is not nervous/anxious.     Patient Active Problem List   Diagnosis Date Noted  . Protein-calorie malnutrition, severe 07/29/2019  . COPD with acute exacerbation ([place]) 07/27/2019  . Acute pansinusitis 04/02/2018  . Chronic bronchitis with acute exacerbation ([place]) 04/02/2018  . Acute on chronic respiratory failure with hypoxia ([place]) 10/28/2017  . Diabetes mellitus without complication ([place]) [id number]  . Aortic atherosclerosis ([place]) 05/08/2016  . Allergic rhinitis due to pollen 05/08/2016  . Excessive weight loss 02/28/2016  . Essential hypertension 05/19/2015  . Prediabetes 05/19/2015  . Osteoporosis 05/19/2015  . COLD (chronic obstructive lung disease) ([place]) 05/19/2015    Allergies  Allergen Reactions  . Tetanus Toxoids Swelling    Past Surgical History:  Procedure Laterality Date  . BREAST LUMPECTOMY     x 2  . BUNIONECTOMY Right   . COLONOSCOPY     normal  . PERIPHERAL VASCULAR CATHETERIZATION N/A 04/10/2016   Procedure: Lower Extremity Angiography;  Surgeon: Katha Cabal, MD;  Location: [facility];  Service: Cardiovascular;  Laterality: N/A;  . PERIPHERAL VASCULAR CATHETERIZATION  04/10/2016   Procedure: Lower Extremity Intervention;  Surgeon: Katha Cabal, MD;  Location: [facility];  Service: Cardiovascular;;  . TUBAL LIGATION      Social History   Tobacco Use  . Smoking status: Current Every Day Smoker    Packs/day: 1.00    Years: 50.00    Pack years: 50.00    Types: Cigarettes  . Smokeless tobacco: Never Used  Substance Use Topics  . Alcohol use: Yes    Alcohol/week: 21.0 standard drinks    Types: 14 Glasses of wine, 7 Cans of beer per week  . Drug use: No     Medication list has been reviewed and updated.  Current Meds  Medication Sig  . albuterol (PROVENTIL HFA;VENTOLIN HFA) 108 (90 Base) MCG/ACT inhaler  Inhale 2 puffs into the lungs every 6 (six) hours as needed for wheezing or shortness of breath.  . fluticasone furoate-vilanterol (BREO ELLIPTA) 200-25 MCG/INH AEPB Inhale 1 puff into the lungs daily.  Marland Kitchen lisinopril (ZESTRIL) 10 MG tablet Take 1 tablet (10 mg total) by mouth daily.  Marland Kitchen loratadine (CLARITIN) 10 MG tablet Take 1 tablet (10 mg total) by mouth daily.  . metFORMIN (GLUCOPHAGE) 500 MG tablet Take 1 tablet (500 mg total) by mouth daily.  Marland Kitchen tiotropium (SPIRIVA HANDIHALER) 18 MCG inhalation capsule Place 18 mcg into inhaler and inhale daily. pulm    PHQ 2/9 Scores 09/29/2019 04/30/2019 07/23/2017 11/14/2015  PHQ - 2 Score 0 0 0 1  PHQ- 9 Score 0 0 0 -    BP Readings from Last 3 Encounters:  09/29/19 (!) 150/80  07/29/19 (!) 156/98  10/23/18 130/80    Physical Exam Vitals and nursing note reviewed.  Constitutional:      General: She is not in acute distress.    Appearance: She is not diaphoretic.  HENT:     Head: Normocephalic and atraumatic.     Right Ear: External ear normal.     Left Ear: External ear normal.     Nose: Nose normal.  Eyes:     General:        Right eye: No discharge.        Left eye: No discharge.     Conjunctiva/sclera: Conjunctivae normal.     Pupils: Pupils are equal, round,  and reactive to light.  Neck:     Thyroid: No thyromegaly.     Vascular: No JVD.  Cardiovascular:     Rate and Rhythm: Normal rate and regular rhythm.     Pulses:          Dorsalis pedis pulses are 1+ on the right side and 1+ on the left side.     Heart sounds: Normal heart sounds. No murmur. No friction rub. No gallop.   Pulmonary:     Effort: Pulmonary effort is normal.     Breath sounds: Normal breath sounds.  Abdominal:     General: Bowel sounds are normal.     Palpations: Abdomen is soft. There is no mass.     Tenderness: There is no abdominal tenderness. There is no guarding.  Musculoskeletal:        General: Normal range of motion.     Cervical back: Normal  range of motion and neck supple.  Feet:     Right foot:     Protective Sensation: 10 sites tested. 10 sites sensed.     Skin integrity: Skin integrity normal.     Left foot:     Protective Sensation: 10 sites tested. 10 sites sensed.     Skin integrity: Skin integrity normal.  Lymphadenopathy:     Cervical: No cervical adenopathy.  Skin:    General: Skin is warm and dry.  Neurological:     Mental Status: She is alert.     Deep Tendon Reflexes: Reflexes are normal and symmetric.     Wt Readings from Last 3 Encounters:  09/29/19 68 lb (30.8 kg)  07/27/19 70 lb (31.8 kg)  10/23/18 72 lb (32.7 kg)    BP (!) 150/80   Pulse 100   Wt 68 lb (30.8 kg)   SpO2 98%   BMI 11.67 kg/m   Assessment and Plan:  1. Essential hypertension Chronic.  Controlled.  Stable.  Continue lisinopril 10 mg but will increase to 1 twice a day given that blood pressure is been elevated.  I do not want to give a larger dose at 1 time I want to split the dose over a 12-hour.  Said that we have true 24-hour coverage. - lisinopril (ZESTRIL) 10 MG tablet; Take 1 tablet (10 mg total) by mouth 2 (two) times daily before a meal.  Dispense: 180 tablet; Refill: 1 - Renal function panel  2. Centrilobular emphysema (HCC) Nephric and centrilobular emphysema with significant respiratory insufficiency due to emphysema.  Patient is on albuterol inhaler, Breo, and Spiriva which is given to her by her pulmonologist.  Patient is also had some steroid stash and has been taking prednisone on a daily basis.  Patient is adamant that she needs a steroid in order to prevent attentively this and I will refill 10 mg on a daily basis until we recheck her in a couple weeks at which time we will discuss weaning off. - albuterol (VENTOLIN HFA) 108 (90 Base) MCG/ACT inhaler; Inhale 2 puffs into the lungs every 6 (six) hours as needed for wheezing or shortness of breath.  Dispense: 8 g; Refill: 3  3. Venous insufficiency Patient has had  swelling of her left ankle but there is no calf swelling and no tenderness.  Rather doubt that she has a DVT but do suspect that she has venous insufficiency.  4. Cigarette nicotine dependence with withdrawal This is a horrible problem for the patient but she cannot stop.Patient has been advised of the  health risks of smoking and counseled concerning cessation of tobacco products. I spent over 3 minutes for discussion and to answer questions.  5. Prediabetes Patient has a history of prediabetes but this is an issue of her steroid dependency of her COPD versus her diabetes.  Patient is on Metformin 500 mg 1 tablet a day.  Patient A1c's have been in the 5.7-6 range.  We will check an A1c and microalbuminuria. - metFORMIN (GLUCOPHAGE) 500 MG tablet; Take 1 tablet (500 mg total) by mouth daily.  Dispense: 90 tablet; Refill: 1 - Hemoglobin A1c - Microalbumin, urine  6. Need for pneumococcal vaccination Discussed and administered - Pneumococcal polysaccharide vaccine 23-valent greater than or equal to 2yo subcutaneous/IM  7. Influenza vaccine needed Discussed and administered - Flu Vaccine QUAD High Dose(Fluad)  Probably the best thing to happen for this patient today states she received her pneumococcal vaccine and influenza on this visit given her lung condition.

## 2019-09-30 LAB — RENAL FUNCTION PANEL
Albumin: 5.1 g/dL — ABNORMAL HIGH (ref 3.8–4.8)
BUN/Creatinine Ratio: 30 — ABNORMAL HIGH (ref 12–28)
BUN: 21 mg/dL (ref 8–27)
CO2: 25 mmol/L (ref 20–29)
Calcium: 10.6 mg/dL — ABNORMAL HIGH (ref 8.7–10.3)
Chloride: 91 mmol/L — ABNORMAL LOW (ref 96–106)
Creatinine, Ser: 0.7 mg/dL (ref 0.57–1.00)
GFR calc Af Amer: 104 mL/min/{1.73_m2} (ref >59)
GFR calc non Af Amer: 90 mL/min/{1.73_m2} (ref >59)
Glucose: 84 mg/dL (ref 65–99)
Phosphorus: 3.8 mg/dL (ref 3.0–4.3)
Potassium: 5 mmol/L (ref 3.5–5.2)
Sodium: 133 mmol/L — ABNORMAL LOW (ref 134–144)

## 2019-09-30 LAB — HEMOGLOBIN A1C
Est. average glucose Bld gHb Est-mCnc: 123 mg/dL
Hgb A1c MFr Bld: 5.9 % — ABNORMAL HIGH (ref 4.8–5.6)

## 2019-09-30 LAB — MICROALBUMIN, URINE: Microalbumin, Urine: 190.9 ug/mL

## 2019-10-25 ENCOUNTER — Other Ambulatory Visit: Payer: Self-pay | Admitting: Family Medicine

## 2019-10-25 DIAGNOSIS — J301 Allergic rhinitis due to pollen: Secondary | ICD-10-CM

## 2019-11-04 ENCOUNTER — Other Ambulatory Visit: Payer: Self-pay

## 2019-11-04 DIAGNOSIS — J432 Centrilobular emphysema: Secondary | ICD-10-CM

## 2019-11-04 MED ORDER — BREO ELLIPTA 200-25 MCG/INH IN AEPB: 1.0000 | INHALATION_SPRAY | Freq: Every day | RESPIRATORY_TRACT | 1 refills | Status: DC

## 2020-01-21 ENCOUNTER — Other Ambulatory Visit: Payer: Self-pay | Admitting: Family Medicine

## 2020-01-21 DIAGNOSIS — J432 Centrilobular emphysema: Secondary | ICD-10-CM

## 2020-03-19 IMAGING — DX DG CHEST 1V PORT
2 series · 2 of 2 positions shown · non-contrast
Comparison: 04/08/2018

CLINICAL DATA: Shortness of breath for 2 days

EXAM:
PORTABLE CHEST 1 VIEW

[chest ap (1 of 2)]
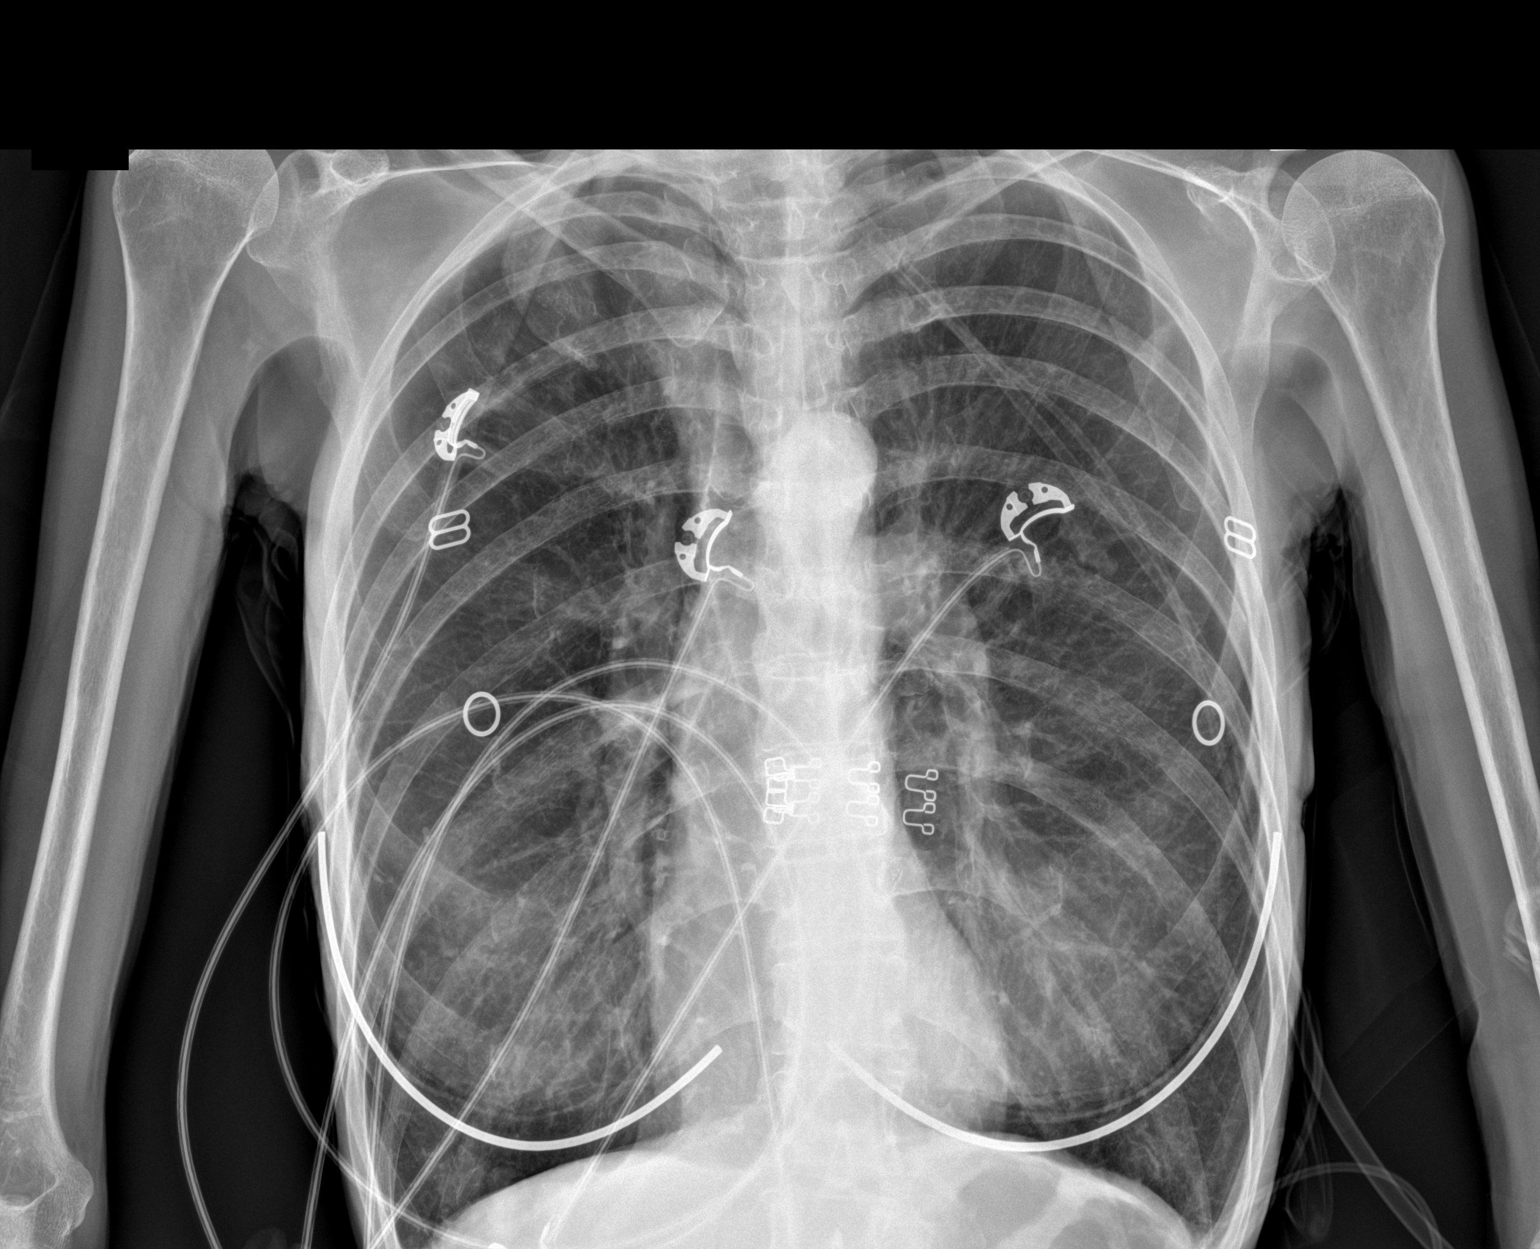

[chest ap (2 of 2)]
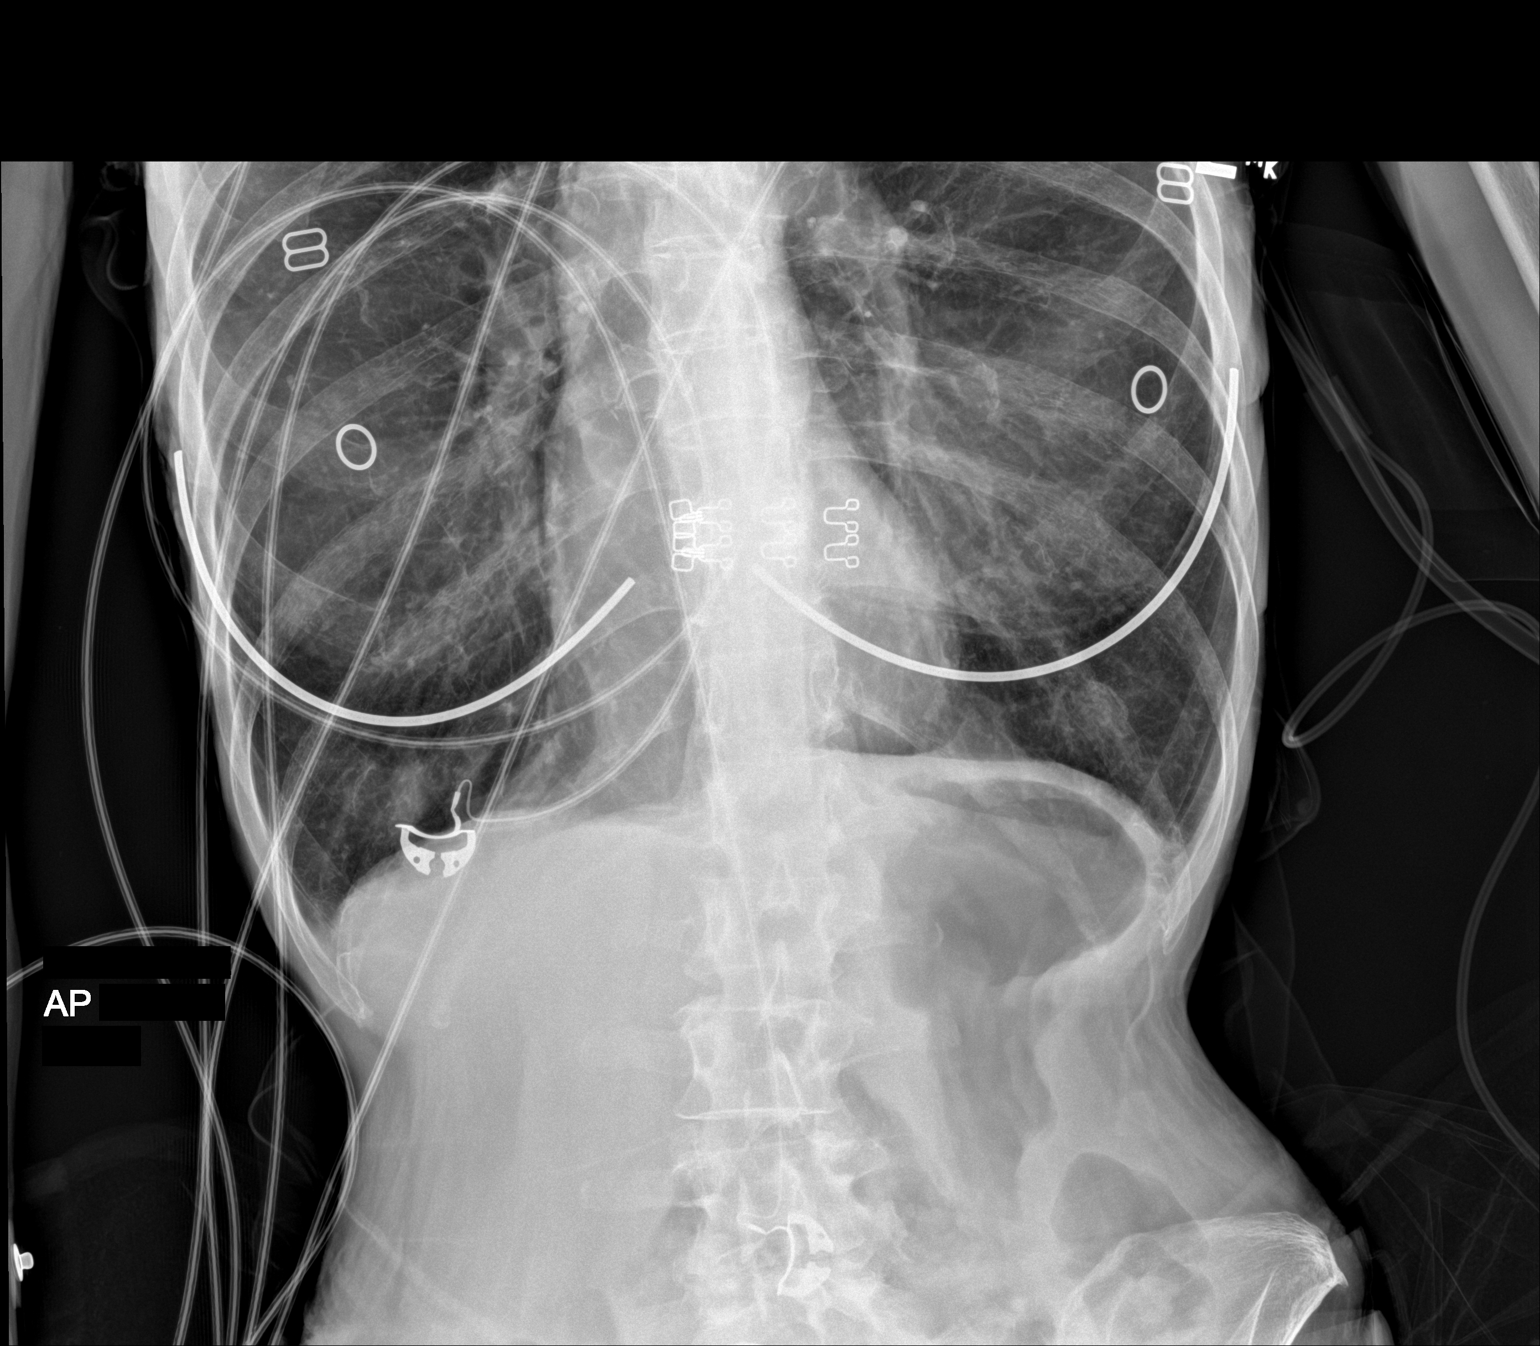

[2 of 2 positions shown; findings below may reference images not displayed]

FINDINGS: Cardiac shadows within normal limits. The lungs are hyperinflated
consistent with COPD. No focal infiltrate or sizable effusion is
noted. No bony abnormality is seen.
IMPRESSION: COPD without acute abnormality.

## 2020-03-20 ENCOUNTER — Other Ambulatory Visit: Payer: Self-pay | Admitting: Family Medicine

## 2020-03-20 NOTE — Telephone Encounter (Signed)
Requested medication (s) are due for refill today: yes  Requested medication (s) are on the active medication list: yes  Last refill:  09/29/19  Future visit scheduled: yes  Notes to clinic:  med not delegated to NT to RF   Requested Prescriptions  Pending Prescriptions Disp Refills   predniSONE (DELTASONE) 10 MG tablet [Pharmacy Med Name: PREDNISONE 10 MG TABLET] 30 tablet 5    Sig: TAKE 1 [place]      Not Delegated - Endocrinology:  Oral Corticosteroids Failed - 03/20/2020  9:39 AM      Failed - This refill cannot be delegated      Failed - Last BP in normal range    BP Readings from Last 1 Encounters:  09/29/19 (!) 150/80          Passed - Valid encounter within last 6 months    Recent Outpatient Visits           5 months ago Essential hypertension   [place], MD   10 months ago Essential hypertension   [place], Deanna C, MD   1 year ago Abnormal CT of the chest   [facility] Juline Patch, MD   1 year ago Prediabetes   [facility] Juline Patch, MD   1 year ago Acute pansinusitis, recurrence not specified   [facility] Juline Patch, MD       Future Appointments             In 1 week Juline Patch, MD [facility], [facility]

## 2020-03-22 ENCOUNTER — Other Ambulatory Visit: Payer: Self-pay | Admitting: Family Medicine

## 2020-03-22 DIAGNOSIS — J432 Centrilobular emphysema: Secondary | ICD-10-CM

## 2020-03-22 NOTE — Telephone Encounter (Signed)
Requested Prescriptions  Pending Prescriptions Disp Refills  . albuterol (VENTOLIN HFA) 108 (90 Base) MCG/ACT inhaler [Pharmacy Med Name: ALBUTEROL HFA (PROAIR) INHALER] 8.5 g 1    Sig: TAKE 2 PUFFS BY MOUTH EVERY 6 HOURS AS NEEDED FOR WHEEZE OR SHORTNESS OF BREATH     Pulmonology:  Beta Agonists Failed - 03/22/2020  2:01 AM      Failed - One inhaler should last at least one month. If the patient is requesting refills earlier, contact the patient to check for uncontrolled symptoms.      Passed - Valid encounter within last 12 months    Recent Outpatient Visits          5 months ago Essential hypertension   [place], MD   10 months ago Essential hypertension   [place], Deanna C, MD   1 year ago Abnormal CT of the chest   [facility] Juline Patch, MD   1 year ago Prediabetes   [facility] Juline Patch, MD   1 year ago Acute pansinusitis, recurrence not specified   [facility] Juline Patch, MD      Future Appointments            In 1 week Juline Patch, MD [facility], [facility]

## 2020-03-31 ENCOUNTER — Ambulatory Visit: Payer: 59 | Admitting: Family Medicine

## 2020-04-17 ENCOUNTER — Other Ambulatory Visit: Payer: Self-pay | Admitting: Family Medicine

## 2020-04-17 DIAGNOSIS — I10 Essential (primary) hypertension: Secondary | ICD-10-CM

## 2020-04-17 NOTE — Telephone Encounter (Signed)
30 day courtesy RF- advise pt to make appts for further refills Requested Prescriptions  Pending Prescriptions Disp Refills  . lisinopril (ZESTRIL) 10 MG tablet [Pharmacy Med Name: LISINOPRIL 10 MG TABLET] 60 tablet 0    Sig: TAKE 1 TABLET (10 MG TOTAL) BY MOUTH 2 (TWO) TIMES DAILY BEFORE A MEAL.     Cardiovascular:  ACE Inhibitors Failed - 04/17/2020  9:07 AM      Failed - Cr in normal range and within 180 days    Creatinine, Ser  Date Value Ref Range Status  09/29/2019 0.70 0.57 - 1.00 mg/dL Final         Failed - K in normal range and within 180 days    Potassium  Date Value Ref Range Status  09/29/2019 5.0 3.5 - 5.2 mmol/L Final         Failed - Last BP in normal range    BP Readings from Last 1 Encounters:  09/29/19 (!) 150/80         Failed - Valid encounter within last 6 months    Recent Outpatient Visits          6 months ago Essential hypertension   [facility], Deanna C, MD   11 months ago Essential hypertension   [facility] Juline Patch, MD   1 year ago Abnormal CT of the chest   [facility] Juline Patch, MD   1 year ago Prediabetes   [facility] Juline Patch, MD   2 years ago Acute pansinusitis, recurrence not specified   [facility] Juline Patch, MD             Passed - Patient is not pregnant

## 2020-04-18 ENCOUNTER — Other Ambulatory Visit: Payer: Self-pay | Admitting: Family Medicine

## 2020-04-18 DIAGNOSIS — J301 Allergic rhinitis due to pollen: Secondary | ICD-10-CM

## 2020-04-18 DIAGNOSIS — R7303 Prediabetes: Secondary | ICD-10-CM

## 2020-04-18 NOTE — Telephone Encounter (Signed)
Call to patient- medication follow up appointment scheduled. RF given per protocol.

## 2020-04-29 ENCOUNTER — Ambulatory Visit: Payer: 59 | Admitting: Family Medicine

## 2020-05-05 ENCOUNTER — Other Ambulatory Visit: Payer: Self-pay

## 2020-05-06 ENCOUNTER — Ambulatory Visit: Payer: 59 | Admitting: Family Medicine

## 2020-05-10 ENCOUNTER — Ambulatory Visit: Payer: Self-pay | Admitting: *Deleted

## 2020-05-10 ENCOUNTER — Encounter: Payer: Self-pay | Admitting: Emergency Medicine

## 2020-05-10 ENCOUNTER — Emergency Department: Payer: 59

## 2020-05-10 ENCOUNTER — Other Ambulatory Visit: Payer: Self-pay

## 2020-05-10 DIAGNOSIS — E119 Type 2 diabetes mellitus without complications: Secondary | ICD-10-CM | POA: Diagnosis present

## 2020-05-10 DIAGNOSIS — Z923 Personal history of irradiation: Secondary | ICD-10-CM

## 2020-05-10 DIAGNOSIS — J441 Chronic obstructive pulmonary disease with (acute) exacerbation: Principal | ICD-10-CM | POA: Diagnosis present

## 2020-05-10 DIAGNOSIS — Z7984 Long term (current) use of oral hypoglycemic drugs: Secondary | ICD-10-CM

## 2020-05-10 DIAGNOSIS — J9621 Acute and chronic respiratory failure with hypoxia: Secondary | ICD-10-CM | POA: Diagnosis present

## 2020-05-10 DIAGNOSIS — F1010 Alcohol abuse, uncomplicated: Secondary | ICD-10-CM | POA: Diagnosis present

## 2020-05-10 DIAGNOSIS — I700 Atherosclerosis of aorta: Secondary | ICD-10-CM | POA: Diagnosis present

## 2020-05-10 DIAGNOSIS — Z833 Family history of diabetes mellitus: Secondary | ICD-10-CM

## 2020-05-10 DIAGNOSIS — E860 Dehydration: Secondary | ICD-10-CM | POA: Diagnosis present

## 2020-05-10 DIAGNOSIS — E878 Other disorders of electrolyte and fluid balance, not elsewhere classified: Secondary | ICD-10-CM | POA: Diagnosis present

## 2020-05-10 DIAGNOSIS — E871 Hypo-osmolality and hyponatremia: Secondary | ICD-10-CM | POA: Diagnosis not present

## 2020-05-10 DIAGNOSIS — Z9851 Tubal ligation status: Secondary | ICD-10-CM

## 2020-05-10 DIAGNOSIS — F17210 Nicotine dependence, cigarettes, uncomplicated: Secondary | ICD-10-CM | POA: Diagnosis present

## 2020-05-10 DIAGNOSIS — Z7951 Long term (current) use of inhaled steroids: Secondary | ICD-10-CM

## 2020-05-10 DIAGNOSIS — E43 Unspecified severe protein-calorie malnutrition: Secondary | ICD-10-CM | POA: Diagnosis present

## 2020-05-10 DIAGNOSIS — I10 Essential (primary) hypertension: Secondary | ICD-10-CM | POA: Diagnosis present

## 2020-05-10 DIAGNOSIS — Z79899 Other long term (current) drug therapy: Secondary | ICD-10-CM

## 2020-05-10 DIAGNOSIS — Z853 Personal history of malignant neoplasm of breast: Secondary | ICD-10-CM

## 2020-05-10 DIAGNOSIS — Z7952 Long term (current) use of systemic steroids: Secondary | ICD-10-CM

## 2020-05-10 DIAGNOSIS — Z887 Allergy status to serum and vaccine status: Secondary | ICD-10-CM

## 2020-05-10 DIAGNOSIS — Z681 Body mass index (BMI) 19 or less, adult: Secondary | ICD-10-CM

## 2020-05-10 DIAGNOSIS — Z20822 Contact with and (suspected) exposure to covid-19: Secondary | ICD-10-CM | POA: Diagnosis present

## 2020-05-10 DIAGNOSIS — M810 Age-related osteoporosis without current pathological fracture: Secondary | ICD-10-CM | POA: Diagnosis present

## 2020-05-10 DIAGNOSIS — R64 Cachexia: Secondary | ICD-10-CM | POA: Diagnosis present

## 2020-05-10 LAB — CBC
HCT: 43.3 % (ref 36.0–46.0)
Hemoglobin: 14.5 g/dL (ref 12.0–15.0)
MCH: 31.5 pg (ref 26.0–34.0)
MCHC: 33.5 g/dL (ref 30.0–36.0)
MCV: 94.1 fL (ref 80.0–100.0)
Platelets: 319 10*3/uL (ref 150–400)
RBC: 4.6 MIL/uL (ref 3.87–5.11)
RDW: 12.8 % (ref 11.5–15.5)
WBC: 14.9 10*3/uL — ABNORMAL HIGH (ref 4.0–10.5)
nRBC: 0 % (ref 0.0–0.2)

## 2020-05-10 LAB — TROPONIN I (HIGH SENSITIVITY): Troponin I (High Sensitivity): 13 ng/L (ref <18)

## 2020-05-10 LAB — BASIC METABOLIC PANEL
Anion gap: 15 (ref 5–15)
BUN: 25 mg/dL — ABNORMAL HIGH (ref 8–23)
CO2: 24 mmol/L (ref 22–32)
Calcium: 9.4 mg/dL (ref 8.9–10.3)
Chloride: 87 mmol/L — ABNORMAL LOW (ref 98–111)
Creatinine, Ser: 0.69 mg/dL (ref 0.44–1.00)
GFR calc Af Amer: >60 mL/min (ref >60)
GFR calc non Af Amer: >60 mL/min (ref >60)
Glucose, Bld: 142 mg/dL — ABNORMAL HIGH (ref 70–99)
Potassium: 4.4 mmol/L (ref 3.5–5.1)
Sodium: 126 mmol/L — ABNORMAL LOW (ref 135–145)

## 2020-05-10 NOTE — ED Triage Notes (Signed)
Patient presents to the ED via EMS from home.  Patient lives at home alone.  Patient states she "always" has shortness of breath but the past few days she has been more short of breath and has been coughing up dark phlegm and red tinged phlegm and patient reports difficulty swallowing food.  Patient's is extremely thin.

## 2020-05-10 NOTE — Telephone Encounter (Signed)
I see patient is now in ED.

## 2020-05-10 NOTE — ED Triage Notes (Signed)
FIRST NURSE NOTE:  Pt arrived via [facility] from home with reports of shortness of breath, pt has hx of COPD, pt was 82% on room air on arrival of EMS, pt placed on 4L [place] sats up to 92%, pt received 2 Duonebs and 125mg  of Solumedrol.  Pt had BP 250/103 also.

## 2020-05-10 NOTE — Telephone Encounter (Signed)
C/o worsening chest congestion, SOB, difficulty breathing. C/o coughing up thick dark, blood streaked mucus since a few days ago. Symptoms worse today. Hx pneumonia , COPD. Increased SOB. Instructed pateint to go to ED now and patient doesn't have a way to ED. Instructed patient to call 911 due to symptoms worsening and difficulty with mobility.Care advise given. Patient verbalized understanding of care advise.   Reason for Disposition  [1] MODERATE difficulty breathing (e.g., speaks in phrases, SOB even at rest, pulse 100-120) AND [2] still present when not coughing  Answer Assessment - Initial Assessment Questions 1. ONSET: "When did the cough begin?"      A few days ago  2. SEVERITY: "How bad is the cough today?" "Did the blood appear after a coughing spell?"      Getting worse. Coughing up thick dark mucus with blood noted  3. SPUTUM: "Describe the color of your sputum" (none, dry cough; clear, white, yellow, green)     Dark and red 4. HEMOPTYSIS: "How much blood?" (flecks, streaks, tablespoons, etc.)     streaks 5. DIFFICULTY BREATHING: "Are you having difficulty breathing?" If Yes, ask: "How bad is it?" (e.g., mild, moderate, severe)    - MILD: No SOB at rest, mild SOB with walking, speaks normally in sentences, can lay down, no retractions, pulse < 100.    - MODERATE: SOB at rest, SOB with minimal exertion and prefers to sit, cannot lie down flat, speaks in phrases, mild retractions, audible wheezing, pulse 100-120.    - SEVERE: Very SOB at rest, speaks in single words, struggling to breathe, sitting hunched forward, retractions, pulse > 120      moderate 6. FEVER: "Do you have a fever?" If Yes, ask: "What is your temperature, how was it measured, and when did it start?"     denies 7. CARDIAC HISTORY: "Do you have any history of heart disease?" (e.g., heart attack, congestive heart failure)      na 8. LUNG HISTORY: "Do you have any history of lung disease?"  (e.g., pulmonary embolus,  asthma, emphysema)     COPD 9. PE RISK FACTORS: "Do you have a history of blood clots?" (or: recent major surgery, recent prolonged travel, bedridden)     na 10. OTHER SYMPTOMS: "Do you have any other symptoms?" (e.g., runny nose, wheezing, chest pain)       Some chest pain 11. PREGNANCY: "Is there any chance you are pregnant?" "When was your last menstrual period?"       na 12. TRAVEL: "Have you traveled out of the country in the last month?" (e.g., travel history, exposures)       na  Protocols used: COUGHING UP BLOOD-A-AH

## 2020-05-10 NOTE — Telephone Encounter (Signed)
I tried to call pt to verify that she understands she needs to go to ER. I did not get an answer when I called, nor can I leave a message. I also called Nira Conn, her niece, to see if she could call her. I did not get an answer on her phone either

## 2020-05-11 ENCOUNTER — Emergency Department: Payer: 59

## 2020-05-11 ENCOUNTER — Inpatient Hospital Stay
Admission: EM | Admit: 2020-05-11 | Discharge: 2020-05-13 | DRG: 190 | Disposition: A | Discharge status: Home / Self Care | Payer: 59 | Attending: Internal Medicine | Admitting: Internal Medicine

## 2020-05-11 DIAGNOSIS — J9621 Acute and chronic respiratory failure with hypoxia: Secondary | ICD-10-CM | POA: Diagnosis not present

## 2020-05-11 DIAGNOSIS — I10 Essential (primary) hypertension: Secondary | ICD-10-CM | POA: Diagnosis present

## 2020-05-11 DIAGNOSIS — E871 Hypo-osmolality and hyponatremia: Secondary | ICD-10-CM | POA: Diagnosis present

## 2020-05-11 DIAGNOSIS — J9601 Acute respiratory failure with hypoxia: Secondary | ICD-10-CM

## 2020-05-11 DIAGNOSIS — Z7289 Other problems related to lifestyle: Secondary | ICD-10-CM | POA: Diagnosis not present

## 2020-05-11 DIAGNOSIS — E119 Type 2 diabetes mellitus without complications: Secondary | ICD-10-CM | POA: Diagnosis not present

## 2020-05-11 DIAGNOSIS — J441 Chronic obstructive pulmonary disease with (acute) exacerbation: Secondary | ICD-10-CM | POA: Diagnosis present

## 2020-05-11 DIAGNOSIS — Z789 Other specified health status: Secondary | ICD-10-CM

## 2020-05-11 DIAGNOSIS — Z720 Tobacco use: Secondary | ICD-10-CM | POA: Diagnosis present

## 2020-05-11 LAB — PHOSPHORUS: Phosphorus: 3.7 mg/dL (ref 2.5–4.6)

## 2020-05-11 LAB — EXPECTORATED SPUTUM ASSESSMENT W GRAM STAIN, RFLX TO RESP C

## 2020-05-11 LAB — SARS CORONAVIRUS 2 BY RT PCR (HOSPITAL ORDER, PERFORMED IN ~~LOC~~ HOSPITAL LAB): SARS Coronavirus 2: NEGATIVE

## 2020-05-11 LAB — HIV ANTIBODY (ROUTINE TESTING W REFLEX): HIV Screen 4th Generation wRfx: NONREACTIVE

## 2020-05-11 LAB — BASIC METABOLIC PANEL
Anion gap: 9 (ref 5–15)
BUN: 24 mg/dL — ABNORMAL HIGH (ref 8–23)
CO2: 25 mmol/L (ref 22–32)
Calcium: 8.7 mg/dL — ABNORMAL LOW (ref 8.9–10.3)
Chloride: 95 mmol/L — ABNORMAL LOW (ref 98–111)
Creatinine, Ser: 0.86 mg/dL (ref 0.44–1.00)
GFR calc Af Amer: >60 mL/min (ref >60)
GFR calc non Af Amer: >60 mL/min (ref >60)
Glucose, Bld: 120 mg/dL — ABNORMAL HIGH (ref 70–99)
Potassium: 4.6 mmol/L (ref 3.5–5.1)
Sodium: 129 mmol/L — ABNORMAL LOW (ref 135–145)

## 2020-05-11 LAB — TROPONIN I (HIGH SENSITIVITY): Troponin I (High Sensitivity): 8 ng/L (ref <18)

## 2020-05-11 LAB — OSMOLALITY: Osmolality: 275 mOsm/kg (ref 275–295)

## 2020-05-11 LAB — MAGNESIUM: Magnesium: 1.9 mg/dL (ref 1.7–2.4)

## 2020-05-11 LAB — GLUCOSE, CAPILLARY
Glucose-Capillary: 116 mg/dL — ABNORMAL HIGH (ref 70–99)
Glucose-Capillary: 145 mg/dL — ABNORMAL HIGH (ref 70–99)

## 2020-05-11 MED ORDER — LORAZEPAM 1 MG PO TABS: 1.0000 mg | ORAL_TABLET | ORAL | Status: DC | PRN

## 2020-05-11 MED ORDER — ADULT MULTIVITAMIN W/MINERALS CH
1.0000 | ORAL_TABLET | Freq: Every day | ORAL | Status: DC
  Administered 2020-05-11 – 2020-05-13 (×3): 1 via ORAL
  Filled 2020-05-11 (×3): qty 1

## 2020-05-11 MED ORDER — INSULIN ASPART 100 UNIT/ML ~~LOC~~ SOLN
0.0000 [IU] | Freq: Every day | SUBCUTANEOUS | Status: DC
  Administered 2020-05-12: 2 [IU] via SUBCUTANEOUS
  Filled 2020-05-11: qty 1

## 2020-05-11 MED ORDER — HYDRALAZINE HCL 20 MG/ML IJ SOLN: 5.0000 mg | INTRAMUSCULAR | Status: DC | PRN

## 2020-05-11 MED ORDER — DM-GUAIFENESIN ER 30-600 MG PO TB12: 1.0000 | ORAL_TABLET | Freq: Two times a day (BID) | ORAL | Status: DC | PRN

## 2020-05-11 MED ORDER — THIAMINE HCL 100 MG PO TABS
100.0000 mg | ORAL_TABLET | Freq: Every day | ORAL | Status: DC
  Administered 2020-05-11 – 2020-05-13 (×3): 100 mg via ORAL
  Filled 2020-05-11 (×3): qty 1

## 2020-05-11 MED ORDER — METHYLPREDNISOLONE SODIUM SUCC 125 MG IJ SOLR
125.0000 mg | Freq: Once | INTRAMUSCULAR | Status: AC
  Administered 2020-05-11: 125 mg via INTRAVENOUS
  Filled 2020-05-11: qty 2

## 2020-05-11 MED ORDER — IPRATROPIUM-ALBUTEROL 0.5-2.5 (3) MG/3ML IN SOLN
3.0000 mL | Freq: Once | RESPIRATORY_TRACT | Status: AC
  Administered 2020-05-11: 3 mL via RESPIRATORY_TRACT
  Filled 2020-05-11: qty 3

## 2020-05-11 MED ORDER — LORATADINE 10 MG PO TABS: 10.0000 mg | ORAL_TABLET | Freq: Every day | ORAL | Status: DC | PRN

## 2020-05-11 MED ORDER — ACETAMINOPHEN 325 MG PO TABS: 650.0000 mg | ORAL_TABLET | Freq: Four times a day (QID) | ORAL | Status: DC | PRN

## 2020-05-11 MED ORDER — SODIUM CHLORIDE 0.9 % IV BOLUS
1000.0000 mL | Freq: Once | INTRAVENOUS | Status: AC
  Administered 2020-05-11: 1000 mL via INTRAVENOUS

## 2020-05-11 MED ORDER — FOLIC ACID 1 MG PO TABS
1.0000 mg | ORAL_TABLET | Freq: Every day | ORAL | Status: DC
  Administered 2020-05-11 – 2020-05-13 (×3): 1 mg via ORAL
  Filled 2020-05-11 (×3): qty 1

## 2020-05-11 MED ORDER — LORAZEPAM 2 MG/ML IJ SOLN: 0.0000 mg | Freq: Four times a day (QID) | INTRAMUSCULAR | Status: DC

## 2020-05-11 MED ORDER — NICOTINE 21 MG/24HR TD PT24
21.0000 mg | MEDICATED_PATCH | Freq: Every day | TRANSDERMAL | Status: DC
  Administered 2020-05-11 – 2020-05-13 (×3): 21 mg via TRANSDERMAL
  Filled 2020-05-11 (×3): qty 1

## 2020-05-11 MED ORDER — AZITHROMYCIN 500 MG PO TABS
500.0000 mg | ORAL_TABLET | Freq: Every day | ORAL | Status: AC
  Administered 2020-05-11: 500 mg via ORAL
  Filled 2020-05-11: qty 1

## 2020-05-11 MED ORDER — LORAZEPAM 2 MG/ML IJ SOLN: 1.0000 mg | INTRAMUSCULAR | Status: DC | PRN

## 2020-05-11 MED ORDER — AZITHROMYCIN 250 MG PO TABS
250.0000 mg | ORAL_TABLET | Freq: Every day | ORAL | Status: DC
  Administered 2020-05-12 – 2020-05-13 (×2): 250 mg via ORAL
  Filled 2020-05-11 (×2): qty 1

## 2020-05-11 MED ORDER — METHYLPREDNISOLONE SODIUM SUCC 40 MG IJ SOLR
20.0000 mg | Freq: Two times a day (BID) | INTRAMUSCULAR | Status: DC
  Administered 2020-05-11 – 2020-05-13 (×4): 20 mg via INTRAVENOUS
  Filled 2020-05-11 (×4): qty 1

## 2020-05-11 MED ORDER — THIAMINE HCL 100 MG/ML IJ SOLN: 100.0000 mg | Freq: Every day | INTRAMUSCULAR | Status: DC

## 2020-05-11 MED ORDER — ALBUTEROL SULFATE (2.5 MG/3ML) 0.083% IN NEBU
10.0000 mg | INHALATION_SOLUTION | Freq: Once | RESPIRATORY_TRACT | Status: AC
  Administered 2020-05-11: 10 mg via RESPIRATORY_TRACT
  Filled 2020-05-11: qty 12

## 2020-05-11 MED ORDER — ONDANSETRON HCL 4 MG/2ML IJ SOLN: 4.0000 mg | Freq: Three times a day (TID) | INTRAMUSCULAR | Status: DC | PRN

## 2020-05-11 MED ORDER — ENOXAPARIN SODIUM 30 MG/0.3ML ~~LOC~~ SOLN
30.0000 mg | SUBCUTANEOUS | Status: DC
  Administered 2020-05-12 – 2020-05-13 (×2): 30 mg via SUBCUTANEOUS
  Filled 2020-05-11 (×2): qty 0.3

## 2020-05-11 MED ORDER — IPRATROPIUM-ALBUTEROL 0.5-2.5 (3) MG/3ML IN SOLN
3.0000 mL | RESPIRATORY_TRACT | Status: DC
  Administered 2020-05-11 – 2020-05-13 (×12): 3 mL via RESPIRATORY_TRACT
  Filled 2020-05-11 (×12): qty 3

## 2020-05-11 MED ORDER — ENOXAPARIN SODIUM 40 MG/0.4ML ~~LOC~~ SOLN
40.0000 mg | SUBCUTANEOUS | Status: DC
  Administered 2020-05-11: 40 mg via SUBCUTANEOUS
  Filled 2020-05-11: qty 0.4

## 2020-05-11 MED ORDER — ALBUTEROL SULFATE (2.5 MG/3ML) 0.083% IN NEBU: 2.5000 mg | INHALATION_SOLUTION | RESPIRATORY_TRACT | Status: DC | PRN

## 2020-05-11 MED ORDER — SODIUM CHLORIDE 0.9 % IV SOLN: INTRAVENOUS | Status: DC

## 2020-05-11 MED ORDER — LORAZEPAM 2 MG/ML IJ SOLN: 0.0000 mg | Freq: Two times a day (BID) | INTRAMUSCULAR | Status: DC

## 2020-05-11 MED ORDER — INSULIN ASPART 100 UNIT/ML ~~LOC~~ SOLN
0.0000 [IU] | Freq: Three times a day (TID) | SUBCUTANEOUS | Status: DC
  Administered 2020-05-12 – 2020-05-13 (×2): 1 [IU] via SUBCUTANEOUS
  Administered 2020-05-13: 2 [IU] via SUBCUTANEOUS
  Filled 2020-05-11 (×3): qty 1

## 2020-05-11 MED ORDER — AMOXICILLIN-POT CLAVULANATE 875-125 MG PO TABS
1.0000 | ORAL_TABLET | Freq: Once | ORAL | Status: AC
  Administered 2020-05-11: 1 via ORAL
  Filled 2020-05-11: qty 1

## 2020-05-11 NOTE — ED Provider Notes (Signed)
[facility] Emergency Department Provider Note  ____________________________________________  Time seen: Approximately 3:00 AM  I have reviewed the triage vital signs and the nursing notes.   HISTORY  Chief Complaint Shortness of Breath   HPI Lindsey Nguyen is a 69 y.o. female history of right breast cancer status post lumpectomy and radiation, severe COPD, smoking, diabetes, hypertension, osteoporosis who presents for evaluation of shortness of breath.  Patient reports that over the last couple of days she has had worsening of her chronic shortness of breath, wheezing, cough productive of rusty colored sputum.  She is not sure if she had a fever.  She does not think she is been exposed to Covid.  She recently received the first Covid vaccine.  She has had no chills, sore throat, vomiting or diarrhea, chest pain.  She reports that she also feels dehydrated.  She reports that her mouth has felt very dry over the last few days and she has been unable to swallow food because of that.  She denies any dysphagia or odynophagia or any mechanical difficulty swallowing.   Past Medical History:  Diagnosis Date  . Cancer [facility])    right breast cancer with lumpectomy and rad tx  . COPD (chronic obstructive pulmonary disease) ([place])   . Diabetes mellitus without complication ([place])   . Hypertension   . Osteoporosis     Patient Active Problem List   Diagnosis Date Noted  . Protein-calorie malnutrition, severe 07/29/2019  . COPD with acute exacerbation ([place]) 07/27/2019  . Acute pansinusitis 04/02/2018  . Chronic bronchitis with acute exacerbation ([place]) 04/02/2018  . Acute on chronic respiratory failure with hypoxia ([place]) 10/28/2017  . Diabetes mellitus without complication ([place]) 78/24/2353  . Aortic atherosclerosis ([place]) 05/08/2016  . Allergic rhinitis due to pollen 05/08/2016  . Excessive weight loss 02/28/2016  . Essential hypertension 05/19/2015  . Prediabetes  05/19/2015  . Osteoporosis 05/19/2015  . COLD (chronic obstructive lung disease) ([place]) 05/19/2015    Past Surgical History:  Procedure Laterality Date  . BREAST LUMPECTOMY     x 2  . BUNIONECTOMY Right   . COLONOSCOPY     normal  . PERIPHERAL VASCULAR CATHETERIZATION N/A 04/10/2016   Procedure: Lower Extremity Angiography;  Surgeon: Katha Cabal, MD;  Location: [facility];  Service: Cardiovascular;  Laterality: N/A;  . PERIPHERAL VASCULAR CATHETERIZATION  04/10/2016   Procedure: Lower Extremity Intervention;  Surgeon: Katha Cabal, MD;  Location: [facility];  Service: Cardiovascular;;  . TUBAL LIGATION      Prior to Admission medications   Medication Sig Start Date End Date Taking? Authorizing Provider  albuterol (VENTOLIN HFA) 108 (90 Base) MCG/ACT inhaler TAKE 2 PUFFS BY MOUTH EVERY 6 HOURS AS NEEDED FOR WHEEZE OR SHORTNESS OF BREATH 03/22/20   Juline Patch, MD  fluticasone furoate-vilanterol (BREO ELLIPTA) 200-25 MCG/INH AEPB Inhale 1 puff into the lungs daily. 11/04/19   Juline Patch, MD  lisinopril (ZESTRIL) 10 MG tablet TAKE 1 TABLET (10 MG TOTAL) BY MOUTH 2 (TWO) TIMES DAILY BEFORE A MEAL. 04/17/20   Juline Patch, MD  loratadine (CLARITIN) 10 MG tablet TAKE 1 TABLET BY MOUTH EVERY DAY 04/18/20   Juline Patch, MD  metFORMIN (GLUCOPHAGE) 500 MG tablet TAKE 1 TABLET BY MOUTH EVERY DAY 04/18/20   Juline Patch, MD  tiotropium (SPIRIVA HANDIHALER) 18 MCG inhalation capsule Place 18 mcg into inhaler and inhale daily. pulm 07/19/18   [provider]  Allergies Tetanus toxoids  Family History  Problem Relation Age of Onset  . Diabetes Father   . Kidney cancer Father   . Cancer Brother   . Diabetes Paternal Aunt   . Diabetes Paternal Uncle   . Kidney failure Mother     Social History Social History   Tobacco Use  . Smoking status: Current Every Day Smoker    Packs/day: 1.00    Years: 50.00    Pack years: 50.00    Types:  Cigarettes  . Smokeless tobacco: Never Used  Substance Use Topics  . Alcohol use: Yes    Alcohol/week: 21.0 standard drinks    Types: 14 Glasses of wine, 7 Cans of beer per week  . Drug use: No    Review of Systems  Constitutional: Negative for fever. Eyes: Negative for visual changes. ENT: Negative for sore throat. Neck: No neck pain  Cardiovascular: Negative for chest pain. Respiratory: + shortness of breath and cough Gastrointestinal: Negative for abdominal pain, vomiting or diarrhea. Genitourinary: Negative for dysuria. Musculoskeletal: Negative for back pain. Skin: Negative for rash. Neurological: Negative for headaches, weakness or numbness. Psych: No SI or HI  ____________________________________________   PHYSICAL EXAM:  VITAL SIGNS: Vitals:   05/11/20 0115 05/11/20 0403  BP: 121/90   Pulse: 95   Resp: 20   Temp: 97.9 F (36.6 C)   SpO2: 95% 95%    Constitutional: Alert and oriented, cachectic, mild respiratory distress.  HEENT:      Head: Normocephalic and atraumatic.         Eyes: Conjunctivae are normal. Sclera is non-icteric.       Mouth/Throat: Mucous membranes are dry.       Neck: Supple with no signs of meningismus. Cardiovascular: Regular rate and rhythm. No murmurs, gallops, or rubs.  Respiratory: Increased work of breathing, speaking in 5 word sentences, diffuse wheezing bilaterally, not hypoxic but tachypneic Gastrointestinal: Soft, non tender. Musculoskeletal: LLE pitting edema. No edema on the right Neurologic: Normal speech and language. Face is symmetric. Moving all extremities. No gross focal neurologic deficits are appreciated. Skin: Skin is warm, dry and intact. No rash noted. Psychiatric: Mood and affect are normal. Speech and behavior are normal.  ____________________________________________   LABS (all labs ordered are listed, but only abnormal results are displayed)  Labs Reviewed  BASIC METABOLIC PANEL - Abnormal; Notable for  the following components:      Result Value   Sodium 126 (*)    Chloride 87 (*)    Glucose, Bld 142 (*)    BUN 25 (*)    All other components within normal limits  CBC - Abnormal; Notable for the following components:   WBC 14.9 (*)    All other components within normal limits  RESP PANEL BY RT PCR (RSV, FLU A&B, COVID)  SARS CORONAVIRUS 2 BY RT PCR (HOSPITAL ORDER, [place] [facility])  TROPONIN I (HIGH SENSITIVITY)  TROPONIN I (HIGH SENSITIVITY)   ____________________________________________  EKG  ED ECG REPORT I, Rudene Re, the attending physician, personally viewed and interpreted this ECG.  Sinus tachycardia, rate of 113, normal intervals, right axis deviation, occasional PVCs, no ST elevations or depressions. ____________________________________________  RADIOLOGY  I have personally reviewed the images performed during this visit and I agree with the Radiologist's read.   Interpretation by Radiologist:  DG Chest 2 View  Result Date: 05/10/2020 CLINICAL DATA:  Short of breath. EXAM: CHEST - 2 VIEW COMPARISON:  07/27/2019 FINDINGS: Cardiac silhouette is  normal in size. No mediastinal or hilar masses or evidence of adenopathy. Lungs are markedly hyperexpanded. Thickened interstitial markings. No evidence of pneumonia or pulmonary edema. No pleural effusion or pneumothorax. Skeletal structures are intact. IMPRESSION: 1. No acute cardiopulmonary disease. 2. Advanced COPD with chronic interstitial thickening. Electronically Signed   By: Lajean Manes M.D.   On: 05/10/2020 16:26   US Venous Img Lower Unilateral Left  Result Date: 05/11/2020 CLINICAL DATA:  Left lower extremity swelling for several months. EXAM: Left LOWER EXTREMITY VENOUS DOPPLER ULTRASOUND TECHNIQUE: Gray-scale sonography with compression, as well as color and duplex ultrasound, were performed to evaluate the deep venous system(s) from the level of the common femoral vein through the  popliteal and proximal calf veins. COMPARISON:  None. FINDINGS: VENOUS Normal compressibility of the common femoral, superficial femoral, and popliteal veins, as well as the visualized calf veins. Visualized portions of profunda femoral vein and great saphenous vein unremarkable. No filling defects to suggest DVT on grayscale or color Doppler imaging. Doppler waveforms show normal direction of venous flow, normal respiratory plasticity and response to augmentation. Limited views of the contralateral common femoral vein are unremarkable. OTHER None. Limitations: none IMPRESSION: Negative for DVT in the left lower extremity. Electronically Signed   By: San Morelle M.D.   On: 05/11/2020 04:29     ____________________________________________   PROCEDURES  Procedure(s) performed:yes .1-3 Lead EKG Interpretation Performed by: Rudene Re, MD Authorized by: Rudene Re, MD     Interpretation: non-specific     ECG rate assessment: tachycardic     Rhythm: sinus tachycardia     Ectopy: PVCs     Critical Care performed: yes  CRITICAL CARE Performed by: Rudene Re  ?  Total critical care time: 40 min  Critical care time was exclusive of separately billable procedures and treating other patients.  Critical care was necessary to treat or prevent imminent or life-threatening deterioration.  Critical care was time spent personally by me on the following activities: development of treatment plan with patient and/or surrogate as well as nursing, discussions with consultants, evaluation of patient's response to treatment, examination of patient, obtaining history from patient or surrogate, ordering and performing treatments and interventions, ordering and review of laboratory studies, ordering and review of radiographic studies, pulse oximetry and re-evaluation of patient's condition.  ____________________________________________   INITIAL IMPRESSION / ASSESSMENT AND  PLAN / ED COURSE  69 y.o. female history of right breast cancer status post lumpectomy and radiation, severe COPD, smoking, diabetes, hypertension, osteoporosis who presents for evaluation of shortness of breath and productive cough.  Patient emaciated, cachectic and in mild respiratory distress with tachypnea, diffuse wheezing bilaterally but not hypoxic.  Chest x-ray visualized showing no infiltrate, confirmed by radiology.  She is afebrile but does have a white count of 14.9.  Will treat as bronchitis/pneumonia with Augmentin, DuoNeb's, and Solu-Medrol.  She does have asymmetric swelling of her left lower extremity will do a Doppler study.  Chemistry panel showing mild hyponatremia and hypochloremia in the setting of pretty significant dehydration.  Will give a bolus of NS.  EKG and troponins with no evidence of ischemia.  Old medical records reviewed.  _________________________ 6:07 AM on 05/11/2020 -----------------------------------------  Patient's respiratory status improved for about an hour but she is again with severe wheezing, increased work of breathing, and just taking few steps in the room she desats to 89%.  Her oxygen is go back to normal at rest.  We will put her on an hour of  continuous and get her admitted to the hospitalist service.  Doppler studies of her lower extremity were negative for DVT.    _____________________________________________ Please note:  Patient was evaluated in Emergency Department today for the symptoms described in the history of present illness. Patient was evaluated in the context of the global COVID-19 pandemic, which necessitated consideration that the patient might be at risk for infection with the SARS-CoV-2 virus that causes COVID-19. Institutional protocols and algorithms that pertain to the evaluation of patients at risk for COVID-19 are in a state of rapid change based on information released by regulatory bodies including the CDC and federal and state  organizations. These policies and algorithms were followed during the patient's care in the ED.  Some ED evaluations and interventions may be delayed as a result of limited staffing during the pandemic.   [place] Controlled Substance Database was reviewed by me. ____________________________________________   FINAL CLINICAL IMPRESSION(S) / ED DIAGNOSES   Final diagnoses:  Acute respiratory failure with hypoxia (HCC)  COPD exacerbation ([place])  Hyponatremia      NEW MEDICATIONS STARTED DURING THIS VISIT:  ED Discharge Orders    None       Note:  This document was prepared using Dragon voice recognition software and may include unintentional dictation errors.    Alfred Levins, [place], MD 05/11/20 [phone]

## 2020-05-11 NOTE — ED Notes (Signed)
VO from MD Niu via secure message fro this RN to place healthy carb modified diet order.

## 2020-05-11 NOTE — ED Notes (Signed)
Pt provided with breakfast and fluids. Pt eating and sitting on edge of bed comfortably. Denies pain at this time.

## 2020-05-11 NOTE — Progress Notes (Signed)
PHARMACIST - PHYSICIAN COMMUNICATION  CONCERNING:  Enoxaparin (Lovenox) for DVT Prophylaxis    RECOMMENDATION: Patient was prescribed enoxaprin 40mg  q24 hours for VTE prophylaxis.   Filed Weights   05/10/20 1530  Weight: 30.8 kg (68 lb)    Body mass index is 12.05 kg/m.  Estimated Creatinine Clearance: 32.7 mL/min (by C-G formula based on SCr of 0.69 mg/dL).   Patient is candidate for enoxaparin 30mg  every 24 hours based on CrCl <18ml/min or Weight <45kg  DESCRIPTION: Pharmacy has adjusted enoxaparin dose per [facility]/[place] policy.   Patient is now receiving enoxaparin 30mg  every 24 hours.  Rowland Lathe, PharmD Clinical Pharmacist  05/11/2020 3:49 PM

## 2020-05-11 NOTE — H&P (Signed)
History and Physical    Lindsey Nguyen ENI:[id number] DOB: 1951-02-18 DOA: 05/11/2020  Referring MD/NP/PA:   PCP: Juline Patch, MD   Patient coming from:  The patient is coming from home.  At baseline, pt is independent for most of ADL.        Chief Complaint: Shortness of breath  HPI: Lindsey Nguyen is a 69 y.o. female with medical history significant of hypertension, diabetes mellitus, COPD, breast cancer (right lumpectomy and radiation therapy), tobacco abuse, alcohol use, who presents with shortness breath.  Patient states that he has shortness of breath for more than 3 days, which has been progressively worsening.  He has productive cough with rusty sputum production.  He also has wheezing.  No chest pain, no fever or chills.  Patient denies nausea, vomiting, diarrhea, abdominal pain, symptoms of UTI or unilateral weakness. She has mild left leg swelling. She received first dose of COVID-19 vaccine.  ED Course: pt was found to have WBC 14.9, trop 13 -->8, negative COVID-19 PCR, sodium 126, renal function okay, temperature 97.9, blood pressure 121/90, tachycardia, RR 22, oxygen saturation 92% on room air, which improved to 93-94% on 4 L nasal cannula oxygen.  Chest x-ray is negative for infiltration, but showed COPD.  Left lower leg venous Dopplers negative for DVT.  Patient is placed on Med[facility] [place] for observation.  Review of Systems:   General: no fevers, chills, no body weight gain, has poor appetite, has fatigue HEENT: no blurry vision, hearing changes or sore throat Respiratory: has dyspnea, coughing, wheezing CV: no chest pain, no palpitations GI: no nausea, vomiting, abdominal pain, diarrhea, constipation GU: no dysuria, burning on urination, increased urinary frequency, hematuria  Ext: no leg edema Neuro: no unilateral weakness, numbness, or tingling, no vision change or hearing loss Skin: no rash, no skin tear. MSK: No muscle spasm, no deformity, no limitation of range  of movement in spin Heme: No easy bruising.  Travel history: No recent long distant travel.  Allergy:  Allergies  Allergen Reactions  . Tetanus Toxoids Swelling    Past Medical History:  Diagnosis Date  . Cancer [facility])    right breast cancer with lumpectomy and rad tx  . COPD (chronic obstructive pulmonary disease) ([place])   . Diabetes mellitus without complication ([place])   . Hypertension   . Osteoporosis     Past Surgical History:  Procedure Laterality Date  . BREAST LUMPECTOMY     x 2  . BUNIONECTOMY Right   . COLONOSCOPY     normal  . PERIPHERAL VASCULAR CATHETERIZATION N/A 04/10/2016   Procedure: Lower Extremity Angiography;  Surgeon: Katha Cabal, MD;  Location: [place] [facility];  Service: Cardiovascular;  Laterality: N/A;  . PERIPHERAL VASCULAR CATHETERIZATION  04/10/2016   Procedure: Lower Extremity Intervention;  Surgeon: Katha Cabal, MD;  Location: [place] [facility];  Service: Cardiovascular;;  . TUBAL LIGATION      Social History:  reports that she has been smoking cigarettes. She has a 50.00 pack-year smoking history. She has never used smokeless tobacco. She reports current alcohol use of about 21.0 standard drinks of alcohol per week. She reports that she does not use drugs.  Family History:  Family History  Problem Relation Age of Onset  . Diabetes Father   . Kidney cancer Father   . Cancer Brother   . Diabetes Paternal Aunt   . Diabetes Paternal Uncle   . Kidney failure Mother      Prior  to Admission medications   Medication Sig Start Date End Date Taking? Authorizing Provider  albuterol (VENTOLIN HFA) 108 (90 Base) MCG/ACT inhaler TAKE 2 PUFFS BY MOUTH EVERY 6 HOURS AS NEEDED FOR WHEEZE OR SHORTNESS OF BREATH 03/22/20   Juline Patch, MD  fluticasone furoate-vilanterol (BREO ELLIPTA) 200-25 MCG/INH AEPB Inhale 1 puff into the lungs daily. 11/04/19   Juline Patch, MD  lisinopril (ZESTRIL) 10 MG tablet TAKE 1 TABLET (10 MG TOTAL) BY MOUTH  2 (TWO) TIMES DAILY BEFORE A MEAL. 04/17/20   Juline Patch, MD  loratadine (CLARITIN) 10 MG tablet TAKE 1 TABLET BY MOUTH EVERY DAY 04/18/20   Juline Patch, MD  metFORMIN (GLUCOPHAGE) 500 MG tablet TAKE 1 TABLET BY MOUTH EVERY DAY 04/18/20   Juline Patch, MD  tiotropium (SPIRIVA HANDIHALER) 18 MCG inhalation capsule Place 18 mcg into inhaler and inhale daily. pulm 07/19/18   [provider]    Physical Exam: Vitals:   05/11/20 0815 05/11/20 0831 05/11/20 1508 05/11/20 1516  BP: 132/70  132/74   Pulse: 97 100 94   Resp: 18  (!) 24   Temp:   98 F (36.7 C)   TempSrc:   Oral   SpO2: 100% 96% 98% 96%  Weight:      Height:       General: Not in acute distress HEENT:       Eyes: PERRL, EOMI, no scleral icterus.       ENT: No discharge from the ears and nose, no pharynx injection, no tonsillar enlargement.        Neck: No JVD, no bruit, no mass felt. Heme: No neck lymph node enlargement. Cardiac: S1/S2, RRR, No murmurs, No gallops or rubs. Respiratory: has wheezing bilaterally GI: Soft, nondistended, nontender, no rebound pain, no organomegaly, BS present. GU: No hematuria Ext: has mild left leg edema. 1+DP/PT pulse bilaterally. Musculoskeletal: No joint deformities, No joint redness or warmth, no limitation of ROM in spin. Skin: No rashes.  Neuro: Alert, oriented X3, cranial nerves II-XII grossly intact, moves all extremities normally. Psych: Patient is not psychotic, no suicidal or hemocidal ideation.  Labs on Admission: I have personally reviewed following labs and imaging studies  CBC: Recent Labs  Lab 05/10/20 1545  WBC 14.9*  HGB 14.5  HCT 43.3  MCV 94.1  PLT 177   Basic Metabolic Panel: Recent Labs  Lab 05/10/20 1545  NA 126*  K 4.4  CL 87*  CO2 24  GLUCOSE 142*  BUN 25*  CREATININE 0.69  CALCIUM 9.4   GFR: Estimated Creatinine Clearance: 32.7 mL/min (by C-G formula based on SCr of 0.69 mg/dL). Liver Function Tests: No results for  input(s): AST, ALT, ALKPHOS, BILITOT, PROT, ALBUMIN in the last 168 hours. No results for input(s): LIPASE, AMYLASE in the last 168 hours. No results for input(s): AMMONIA in the last 168 hours. Coagulation Profile: No results for input(s): INR, PROTIME in the last 168 hours. Cardiac Enzymes: No results for input(s): CKTOTAL, CKMB, CKMBINDEX, TROPONINI in the last 168 hours. BNP (last 3 results) No results for input(s): PROBNP in the last 8760 hours. HbA1C: No results for input(s): HGBA1C in the last 72 hours. CBG: No results for input(s): GLUCAP in the last 168 hours. Lipid Profile: No results for input(s): CHOL, HDL, LDLCALC, TRIG, CHOLHDL, LDLDIRECT in the last 72 hours. Thyroid Function Tests: No results for input(s): TSH, T4TOTAL, FREET4, T3FREE, THYROIDAB in the last 72 hours. Anemia Panel: No results for input(s): VITAMINB12,  FOLATE, FERRITIN, TIBC, IRON, RETICCTPCT in the last 72 hours. Urine analysis:    Component Value Date/Time   BILIRUBINUR neg 01/07/2017 1056   PROTEINUR trace 01/07/2017 1056   UROBILINOGEN 0.2 01/07/2017 1056   NITRITE pos 01/07/2017 1056   LEUKOCYTESUR large (3+) (A) 01/07/2017 1056   Sepsis Labs: @LABRCNTIP (procalcitonin:4,lacticidven:4) ) Recent Results (from the past 240 hour(s))  SARS Coronavirus 2 by RT PCR (hospital order, performed in [facility] hospital lab) Nasopharyngeal Nasopharyngeal Swab     Status: None   Collection Time: 05/11/20  6:06 AM   Specimen: Nasopharyngeal Swab  Result Value Ref Range Status   SARS Coronavirus 2 NEGATIVE NEGATIVE Final    Comment: (NOTE) SARS-CoV-2 target nucleic acids are NOT DETECTED.  The SARS-CoV-2 RNA is generally detectable in upper and lower respiratory specimens during the acute phase of infection. The lowest concentration of SARS-CoV-2 viral copies this assay can detect is 250 copies / mL. A negative result does not preclude SARS-CoV-2 infection and should not be used as the sole basis for  treatment or other patient management decisions.  A negative result may occur with improper specimen collection / handling, submission of specimen other than nasopharyngeal swab, presence of viral mutation(s) within the areas targeted by this assay, and inadequate number of viral copies (<250 copies / mL). A negative result must be combined with clinical observations, patient history, and epidemiological information.  Fact Sheet for Patients:   StrictlyIdeas.no  Fact Sheet for Healthcare Providers: BankingDealers.co.za  This test is not yet approved or  cleared by the [place] FDA and has been authorized for detection and/or diagnosis of SARS-CoV-2 by FDA under an Emergency Use Authorization (EUA).  This EUA will remain in effect (meaning this test can be used) for the duration of the COVID-19 declaration under Section 564(b)(1) of the Act, 21 U.S.C. section 360bbb-3(b)(1), unless the authorization is terminated or revoked sooner.  Performed at [facility], [address] [place], [place] [address]   Culture, sputum-assessment     Status: None   Collection Time: 05/11/20  8:14 AM   Specimen: Sputum  Result Value Ref Range Status   Specimen Description SPUTUM  Final   Special Requests NONE  Final   Sputum evaluation   Final    Sputum specimen not acceptable for testing.  Please recollect.   REQUEST FOR RECOLLECT CALLED TO YESSICA AGUAS AT 1330 ON 05/11/2020 [place]. Performed at [facility], [address] [place], [place] [address]    Report Status 05/11/2020 FINAL  Final     Radiological Exams on Admission: DG Chest 2 View  Result Date: 05/10/2020 CLINICAL DATA:  Short of breath. EXAM: CHEST - 2 VIEW COMPARISON:  07/27/2019 FINDINGS: Cardiac silhouette is normal in size. No mediastinal or hilar masses or evidence of adenopathy. Lungs are markedly hyperexpanded. Thickened interstitial markings. No evidence  of pneumonia or pulmonary edema. No pleural effusion or pneumothorax. Skeletal structures are intact. IMPRESSION: 1. No acute cardiopulmonary disease. 2. Advanced COPD with chronic interstitial thickening. Electronically Signed   By: Lajean Manes M.D.   On: 05/10/2020 16:26   US Venous Img Lower Unilateral Left  Result Date: 05/11/2020 CLINICAL DATA:  Left lower extremity swelling for several months. EXAM: Left LOWER EXTREMITY VENOUS DOPPLER ULTRASOUND TECHNIQUE: Gray-scale sonography with compression, as well as color and duplex ultrasound, were performed to evaluate the deep venous system(s) from the level of the common femoral vein through the popliteal and proximal calf veins. COMPARISON:  None. FINDINGS:  VENOUS Normal compressibility of the common femoral, superficial femoral, and popliteal veins, as well as the visualized calf veins. Visualized portions of profunda femoral vein and great saphenous vein unremarkable. No filling defects to suggest DVT on grayscale or color Doppler imaging. Doppler waveforms show normal direction of venous flow, normal respiratory plasticity and response to augmentation. Limited views of the contralateral common femoral vein are unremarkable. OTHER None. Limitations: none IMPRESSION: Negative for DVT in the left lower extremity. Electronically Signed   By: San Morelle M.D.   On: 05/11/2020 04:29     EKG:   Not done in ED, will get one.   Assessment/Plan Principal Problem:   COPD with acute exacerbation (HCC) Active Problems:   Essential hypertension   Diabetes mellitus without complication ([place])   Acute on chronic respiratory failure with hypoxia (HCC)   Tobacco abuse   Alcohol use   Hyponatremia  Acute on chronic respiratory failure with hypoxia due to COPD with acute exacerbation (HCC)  - will place to progressive unit for observation -Bronchodilators -Solu-Medrol 20 mg IV tid -Z pak  -Mucinex for cough  -Incentive spirometry -Follow up  blood culture x2, sputum culture -Nasal cannula oxygen as needed to maintain O2 saturation 93% or greater  Essential hypertension -IV hydralazine. -Hold lisinopril due to hyponatremia  Diabetes mellitus without complication ([place]): Recent A1c 5.9, well controlled.  Patient is taking Metformin at home -Sliding scale insulin  Tobacco abuse and Alcohol abuse: -Did counseling about importance of quitting smoking and drinking alcohol -Nicotine patch -CIWA protocol  Hyponatremia: Na 126, mental status normal.  Most likely due to alcohol abuse - Will check urine sodium, urine osmolality, serum osmolality. - check TSH - Fluid restriction - IVF: 1L NS in ED, will continue with IV normal saline at 75 mL/h - f/u by BMP q8h    DVT ppx:   SQ Lovenox Code Status: Full code Family Communication: not done, no family member is at bed side.  Disposition Plan:  Anticipate discharge back to previous environment Consults called:  None Admission status: Med-surg bed for obs   Status is: Observation  The patient remains OBS appropriate and will d/c before 2 midnights.  Dispo: The patient is from: Home              Anticipated d/c is to: Home              Anticipated d/c date is: 1 day              Patient currently is not medically stable to d/c.          Date of Service 05/11/2020    Ivor Costa [facility]   If 7PM-7AM, please contact night-coverage [email or web address] 05/11/2020, 4:08 PM

## 2020-05-11 NOTE — ED Notes (Signed)
Pt provided with lunch at bedside.

## 2020-05-11 NOTE — Progress Notes (Signed)
   05/11/20 1639  Assess: MEWS Score  Temp 98.3 F (36.8 C)  BP 131/80  Pulse Rate (!) 105  Resp (!) 24  SpO2 92 %  O2 Device Room Air  Assess: MEWS Score  MEWS Temp 0  MEWS Systolic 0  MEWS Pulse 1  MEWS RR 1  MEWS LOC 0  MEWS Score 2  MEWS Score Color Yellow  Assess: if the MEWS score is Yellow or Red  Were vital signs taken at a resting state? Yes  Focused Assessment No change from prior assessment  Early Detection of Sepsis Score *See Row Information* Low  MEWS guidelines implemented *See Row Information* Yes  Treat  MEWS Interventions Escalated (See documentation below)  Pain Scale 0-10  Pain Score 0  Take Vital Signs  Increase Vital Sign Frequency  Yellow: Q 2hr X 2 then Q 4hr X 2, if remains yellow, continue Q 4hrs  Escalate  MEWS: Escalate Yellow: discuss with charge nurse/RN and consider discussing with provider and RRT  Notify: Charge Nurse/RN  Name of Charge Nurse/RN Notified [place] RN  Date Charge Nurse/RN Notified 05/11/20  Time Charge Nurse/RN Notified 1730  Document  Progress note created (see row info) Yes

## 2020-05-11 NOTE — ED Notes (Signed)
Pt provide with specimen cup to provide sputum sample.

## 2020-05-12 DIAGNOSIS — M810 Age-related osteoporosis without current pathological fracture: Secondary | ICD-10-CM | POA: Diagnosis present

## 2020-05-12 DIAGNOSIS — Z9851 Tubal ligation status: Secondary | ICD-10-CM | POA: Diagnosis not present

## 2020-05-12 DIAGNOSIS — I700 Atherosclerosis of aorta: Secondary | ICD-10-CM | POA: Diagnosis present

## 2020-05-12 DIAGNOSIS — F1010 Alcohol abuse, uncomplicated: Secondary | ICD-10-CM | POA: Diagnosis present

## 2020-05-12 DIAGNOSIS — J441 Chronic obstructive pulmonary disease with (acute) exacerbation: Secondary | ICD-10-CM | POA: Diagnosis present

## 2020-05-12 DIAGNOSIS — Z79899 Other long term (current) drug therapy: Secondary | ICD-10-CM | POA: Diagnosis not present

## 2020-05-12 DIAGNOSIS — J9621 Acute and chronic respiratory failure with hypoxia: Secondary | ICD-10-CM | POA: Diagnosis present

## 2020-05-12 DIAGNOSIS — I10 Essential (primary) hypertension: Secondary | ICD-10-CM | POA: Diagnosis present

## 2020-05-12 DIAGNOSIS — R64 Cachexia: Secondary | ICD-10-CM | POA: Diagnosis present

## 2020-05-12 DIAGNOSIS — F17210 Nicotine dependence, cigarettes, uncomplicated: Secondary | ICD-10-CM | POA: Diagnosis present

## 2020-05-12 DIAGNOSIS — E43 Unspecified severe protein-calorie malnutrition: Secondary | ICD-10-CM | POA: Diagnosis present

## 2020-05-12 DIAGNOSIS — E871 Hypo-osmolality and hyponatremia: Secondary | ICD-10-CM | POA: Diagnosis present

## 2020-05-12 DIAGNOSIS — E119 Type 2 diabetes mellitus without complications: Secondary | ICD-10-CM | POA: Diagnosis present

## 2020-05-12 DIAGNOSIS — Z20822 Contact with and (suspected) exposure to covid-19: Secondary | ICD-10-CM | POA: Diagnosis present

## 2020-05-12 DIAGNOSIS — Z833 Family history of diabetes mellitus: Secondary | ICD-10-CM | POA: Diagnosis not present

## 2020-05-12 DIAGNOSIS — Z7952 Long term (current) use of systemic steroids: Secondary | ICD-10-CM | POA: Diagnosis not present

## 2020-05-12 DIAGNOSIS — Z7984 Long term (current) use of oral hypoglycemic drugs: Secondary | ICD-10-CM | POA: Diagnosis not present

## 2020-05-12 DIAGNOSIS — E878 Other disorders of electrolyte and fluid balance, not elsewhere classified: Secondary | ICD-10-CM | POA: Diagnosis present

## 2020-05-12 DIAGNOSIS — Z681 Body mass index (BMI) 19 or less, adult: Secondary | ICD-10-CM | POA: Diagnosis not present

## 2020-05-12 DIAGNOSIS — Z887 Allergy status to serum and vaccine status: Secondary | ICD-10-CM | POA: Diagnosis not present

## 2020-05-12 DIAGNOSIS — Z853 Personal history of malignant neoplasm of breast: Secondary | ICD-10-CM | POA: Diagnosis not present

## 2020-05-12 DIAGNOSIS — Z923 Personal history of irradiation: Secondary | ICD-10-CM | POA: Diagnosis not present

## 2020-05-12 DIAGNOSIS — E860 Dehydration: Secondary | ICD-10-CM | POA: Diagnosis present

## 2020-05-12 DIAGNOSIS — Z7951 Long term (current) use of inhaled steroids: Secondary | ICD-10-CM | POA: Diagnosis not present

## 2020-05-12 LAB — GLUCOSE, CAPILLARY
Glucose-Capillary: 113 mg/dL — ABNORMAL HIGH (ref 70–99)
Glucose-Capillary: 145 mg/dL — ABNORMAL HIGH (ref 70–99)
Glucose-Capillary: 95 mg/dL (ref 70–99)
Glucose-Capillary: 204 mg/dL — ABNORMAL HIGH (ref 70–99)

## 2020-05-12 LAB — BASIC METABOLIC PANEL
Anion gap: 10 (ref 5–15)
BUN: 25 mg/dL — ABNORMAL HIGH (ref 8–23)
CO2: 25 mmol/L (ref 22–32)
Calcium: 9 mg/dL (ref 8.9–10.3)
Chloride: 97 mmol/L — ABNORMAL LOW (ref 98–111)
Creatinine, Ser: 0.91 mg/dL (ref 0.44–1.00)
GFR calc Af Amer: >60 mL/min (ref >60)
GFR calc non Af Amer: >60 mL/min (ref >60)
Glucose, Bld: 129 mg/dL — ABNORMAL HIGH (ref 70–99)
Potassium: 4.3 mmol/L (ref 3.5–5.1)
Sodium: 132 mmol/L — ABNORMAL LOW (ref 135–145)

## 2020-05-12 LAB — TSH: TSH: 0.822 u[IU]/mL (ref 0.350–4.500)

## 2020-05-12 MED ORDER — METFORMIN HCL 500 MG PO TABS: 500.0000 mg | ORAL_TABLET | Freq: Every day | ORAL | Status: DC

## 2020-05-12 MED ORDER — LISINOPRIL 10 MG PO TABS
10.0000 mg | ORAL_TABLET | Freq: Two times a day (BID) | ORAL | Status: DC
  Administered 2020-05-12 – 2020-05-13 (×2): 10 mg via ORAL
  Filled 2020-05-12 (×2): qty 1

## 2020-05-12 NOTE — Evaluation (Addendum)
Physical Therapy Evaluation Patient Details Name: Lindsey Nguyen MRN: [id number] DOB: [birth year] Today's Date: 05/12/2020   History of Present Illness  Lindsey Nguyen is a 69yo female PMH: HTN, DM,  COPD, breast cancer (right lumpectomy and radiation therapy), tobacco abuse, who presents to [facility] ED on 8/10 c SOB x 3days, sputum, mild leg edema- pt admitted in COPD exacerbation.  Clinical Impression  Pt admitted with above diagnosis. Pt currently with functional limitations due to the deficits listed below (see "PT Problem List"). Upon entry, pt in bed, awake and agreeable to participate. The pt is alert and oriented x4, pleasant, conversational, and generally a good historian. Pt is tachycardic entire session, likely related to meds, but worse with AMB up to sustained 126bpm. SpO2 at 92% resting upon entry, drops rapidly to 85% after room-to-room distance AMB. Functional mobility assessment demonstrates increased effort/time requirements, poor tolerance, and need for physical assistance, whereas the patient performed these at a higher level of independence PTA. Pt will benefit from skilled PT intervention to increase independence and safety with basic mobility in preparation for discharge to the venue listed below.       Follow Up Recommendations Home health PT    Equipment Recommendations  Other (comment) (O2; would benefit from 4WW but pt undecided if she wants one. Will try one out next visit)    Recommendations for Other Services       Precautions / Restrictions Precautions Precautions: Fall Restrictions Weight Bearing Restrictions: No      Mobility  Bed Mobility Overal bed mobility: Modified Independent                Transfers Overall transfer level: Needs assistance Equipment used: None Transfers: Sit to/from Stand Sit to Stand: Min guard;Supervision            Ambulation/Gait Ambulation/Gait assistance: Min guard Gait Distance (Feet): 32 Feet Assistive  device: None       General Gait Details: 3x60ft, interval terminated d/t DOE; SpO2 86% on RA, 97% on 2L/min  Stairs            Wheelchair Mobility    Modified Rankin (Stroke Patients Only)       Balance Overall balance assessment: Needs assistance           Standing balance-Leahy Scale: Fair Standing balance comment: holds on to furniture for support, no gross LOB                             Pertinent Vitals/Pain Pain Assessment: No/denies pain    Home Living Family/patient expects to be discharged to:: Private residence Living Arrangements: Alone Available Help at Discharge: Family Type of Home: House Home Access: Stairs to enter Entrance Stairs-Rails: Right Entrance Stairs-Number of Steps: 3 Home Layout: One level Home Equipment: Shower seat;Hand held shower head      Prior Function           Comments: Orders groceries online; has hired help with home care; defers to hired help for trips into community; Has a fear of falls, but denies any recent falls/close calls in past 12 months.     Hand Dominance        Extremity/Trunk Assessment   Upper Extremity Assessment Upper Extremity Assessment: Generalized weakness;Overall [facility] for tasks assessed    Lower Extremity Assessment Lower Extremity Assessment: Generalized weakness;Overall [facility] for tasks assessed    Cervical / Trunk Assessment Cervical / Trunk Assessment: Kyphotic  Communication  Communication: No difficulties  Cognition Arousal/Alertness: Awake/alert Behavior During Therapy: WFL for tasks assessed/performed;Anxious Overall Cognitive Status: Within Functional Limits for tasks assessed                                        General Comments      Exercises     Assessment/Plan    PT Assessment Patient needs continued PT services  PT Problem List Cardiopulmonary status limiting activity;Decreased balance       PT Treatment Interventions DME  instruction;Gait training;Balance training;Stair training;Functional mobility training;Therapeutic activities;Therapeutic exercise;Patient/family education    PT Goals (Current goals can be found in the Care Plan section)  Acute Rehab PT Goals Patient Stated Goal: Return to home, stay safe, avoid falls PT Goal Formulation: With patient Time For Goal Achievement: 05/06/2020 Potential to Achieve Goals: Good    Frequency Min 2X/week   Barriers to discharge        Co-evaluation               AM-PAC PT "6 Clicks" Mobility  Outcome Measure Help needed turning from your back to your side while in a flat bed without using bedrails?: A Little Help needed moving from lying on your back to sitting on the side of a flat bed without using bedrails?: A Little Help needed moving to and from a bed to a chair (including a wheelchair)?: A Little Help needed standing up from a chair using your arms (e.g., wheelchair or bedside chair)?: A Little Help needed to walk in hospital room?: A Little Help needed climbing 3-5 steps with a railing? : A Little 6 Click Score: 18    End of Session Equipment Utilized During Treatment: Gait belt;Oxygen Activity Tolerance: Other (comment) (breathing) Patient left: in bed;with bed alarm set;with call bell/phone within reach Nurse Communication: Mobility status PT Visit Diagnosis: Unsteadiness on feet (R26.81);Difficulty in walking, not elsewhere classified (R26.2)    Time: 5364-6803 PT Time Calculation (min) (ACUTE ONLY): 28 min   Charges:   PT Evaluation $PT Eval Moderate Complexity: 1 Mod PT Treatments $Therapeutic Exercise: 8-22 mins       12:29 PM, 05/12/20 Etta Grandchild, PT, DPT Physical Therapist - [facility]  [phone] ([place])    [place] C 05/12/2020, 12:26 PM

## 2020-05-12 NOTE — Progress Notes (Signed)
Physical Therapy Treatment Patient Details Name: Lindsey Nguyen MRN: [id number] DOB: 28-Sep-1951 Today's Date: 05/12/2020   Pt desaturates on room air with mobility and requires supplemental oxygen to maintain a safe saturation level.  SpO2 on room air at rest = 92% SpO2 on room air while ambulating = 85% SpO2 on 2 liters of O2 while ambulating = 96%  1:55 PM, 05/12/20 Etta Grandchild, PT, DPT Physical Therapist - [facility]  [phone] [facility])

## 2020-05-12 NOTE — Progress Notes (Signed)
[place] [facility] [place] NAME: Lindsey Nguyen    MR#:  [id number]  DATE OF BIRTH:  12-13-1950  SUBJECTIVE:   Came in with increasing shortness of breath, coughing up rusty colored sputum no fever. Poor eater at home. Works remotely as an Optometrist. Continues to smoke--" I'm smoking much less than three packs a day which I used to before" drinks couple glasses of alcohol on a daily basis-- patient does not quantitate exactly REVIEW OF SYSTEMS:   Review of Systems  Constitutional: Negative for chills, fever and weight loss.  HENT: Negative for ear discharge, ear pain and nosebleeds.   Eyes: Negative for blurred vision, pain and discharge.  Respiratory: Positive for shortness of breath. Negative for sputum production, wheezing and stridor.   Cardiovascular: Negative for chest pain, palpitations, orthopnea and PND.  Gastrointestinal: Negative for abdominal pain, diarrhea, nausea and vomiting.  Genitourinary: Negative for frequency and urgency.  Musculoskeletal: Negative for back pain and joint pain.  Neurological: Positive for weakness. Negative for sensory change, speech change and focal weakness.  Psychiatric/Behavioral: Negative for depression and hallucinations. The patient is nervous/anxious.    Tolerating Diet:yes  Tolerating PT: pending  DRUG ALLERGIES:   Allergies  Allergen Reactions  . Tetanus Toxoids Swelling    VITALS:  Blood pressure 140/80, pulse (!) 103, temperature 97.6 F (36.4 C), temperature source Axillary, resp. rate 20, height 5\' 3"  (1.6 m), weight 30.8 kg, SpO2 93 %.  PHYSICAL EXAMINATION:   Physical Exam  GENERAL:  69 y.o.-year-old patient lying in the bed with no acute distress.  Thin cachectic EYES: Pupils equal, round, reactive to light and accommodation. No scleral icterus.   HEENT: Head atraumatic, normocephalic. Oropharynx and nasopharynx clear.  Poor dentition.  LUNGS: diminished breath sounds bilaterally,  no wheezing, rales, rhonchi. No use of accessory muscles of respiration. Emphysematous chest+ CARDIOVASCULAR: S1, S2 normal. No murmurs, rubs, or gallops.  ABDOMEN: Soft, nontender, nondistended. Bowel sounds present. No organomegaly or mass.  EXTREMITIES: No cyanosis, clubbing or edema b/l.    NEUROLOGIC: Cranial nerves II through XII are intact. No focal Motor or sensory deficits b/l.   PSYCHIATRIC:  patient is alert and oriented x 3. Mild anxiety SKIN: No obvious rash, lesion, or ulcer.   LABORATORY PANEL:  CBC Recent Labs  Lab 05/10/20 1545  WBC 14.9*  HGB 14.5  HCT 43.3  PLT 319    Chemistries  Recent Labs  Lab 05/11/20 1621 05/11/20 1728 05/12/20 0123  NA  --    < > 132*  K  --    < > 4.3  CL  --    < > 97*  CO2  --    < > 25  GLUCOSE  --    < > 129*  BUN  --    < > 25*  CREATININE  --    < > 0.91  CALCIUM  --    < > 9.0  MG 1.9  --   --    < > = values in this interval not displayed.   Cardiac Enzymes No results for input(s): TROPONINI in the last 168 hours. RADIOLOGY:  DG Chest 2 View  Result Date: 05/10/2020 CLINICAL DATA:  Short of breath. EXAM: CHEST - 2 VIEW COMPARISON:  07/27/2019 FINDINGS: Cardiac silhouette is normal in size. No mediastinal or hilar masses or evidence of adenopathy. Lungs are markedly hyperexpanded. Thickened interstitial markings. No evidence of pneumonia or pulmonary edema. No pleural effusion  or pneumothorax. Skeletal structures are intact. IMPRESSION: 1. No acute cardiopulmonary disease. 2. Advanced COPD with chronic interstitial thickening. Electronically Signed   By: Lajean Manes M.D.   On: 05/10/2020 16:26   US Venous Img Lower Unilateral Left  Result Date: 05/11/2020 CLINICAL DATA:  Left lower extremity swelling for several months. EXAM: Left LOWER EXTREMITY VENOUS DOPPLER ULTRASOUND TECHNIQUE: Gray-scale sonography with compression, as well as color and duplex ultrasound, were performed to evaluate the deep venous system(s) from  the level of the common femoral vein through the popliteal and proximal calf veins. COMPARISON:  None. FINDINGS: VENOUS Normal compressibility of the common femoral, superficial femoral, and popliteal veins, as well as the visualized calf veins. Visualized portions of profunda femoral vein and great saphenous vein unremarkable. No filling defects to suggest DVT on grayscale or color Doppler imaging. Doppler waveforms show normal direction of venous flow, normal respiratory plasticity and response to augmentation. Limited views of the contralateral common femoral vein are unremarkable. OTHER None. Limitations: none IMPRESSION: Negative for DVT in the left lower extremity. Electronically Signed   By: San Morelle M.D.   On: 05/11/2020 04:29   ASSESSMENT AND PLAN:   Lindsey Nguyen is a 69 y.o. female with medical history significant of hypertension, diabetes mellitus, COPD, breast cancer (right lumpectomy and radiation therapy), tobacco abuse, alcohol use, who presents with shortness breath. Patient states that he has shortness of breath for more than 3 days, which has been progressively worsening.  He has productive cough with rusty sputum production.Chest x-ray is negative for infiltration, but showed COPD.  Left lower leg venous Dopplers negative for DVT.  Acute on chronic respiratory failure with hypoxia due to COPD with acute exacerbation (HCC)-- steroid dependent severe COPD (on 20 mg PO prednisone at home) -Bronchodilators prn -recommendation be discharged with nebulizer this time to home. -Solu-Medrol 20 mg IV tid -Z pak -- empirically. Chest x-ray negative for pneumonia -Dr. Raul Del pulmonary consultation placed -Mucinex for cough  -Incentive spirometry -Nasal cannula oxygen as neededto maintain O2 saturation 93% or greater -assess for home oxygen.  Essential hypertension -IV hydralazine. -resume lisinopril   Diabetes mellitus without complication [facility]): Recent A1c 5.9, well  controlled.   -Patient is taking Metformin at home -Sliding scale insulin  Tobacco abuse and Alcohol abuse: -Did counseling about importance of quitting smoking and drinking alcohol -Nicotine patch -CIWA protocol-- scoring low  Hyponatremia: Na 126, mental status normal.  Most likely due to alcohol abuse -- patient is on  not happy above fluid restriction- sodium 132-will give heart healthy diet   Nutrition Status: Nutrition Problem: Severe Malnutrition Etiology: chronic illness (COPD, breast cancer) Signs/Symptoms: severe fat depletion, severe muscle depletion Interventions: MVI, Magic cup patient does not like any supplements, magic cup, vitamins recommended by dietitian during this admission and previous admission.   DVT ppx:   SQ Lovenox Code Status: Full code Family Communication: not done, no family member is at bed side.  Disposition Plan:  Anticipate discharge back to previous environment Consults called:  None Admission status: inpatient T patient appears very debilitated. She is still short winded. Need to assess oxygen for home use. Physical therapy to see patient. Pulmonary consultation placed with Dr. Raul Del. Dispo: The patient is from: Home  Anticipated d/c is to: Home  Anticipated d/c date is: likely tomorrow  Patient currently is not medically stable to d/c.    TOTAL TIME TAKING CARE OF THIS PATIENT: *30* minutes.  >50% time spent on counselling and coordination of  care  Note: This dictation was prepared with Dragon dictation along with smaller phrase technology. Any transcriptional errors that result from this process are unintentional.  Fritzi Mandes M.D    [facility]   CC: Primary care physician; Juline Patch, MDPatient ID: Lindsey Nguyen, female   DOB: 11-12-1950, 69 y.o.   MRN: [id number]

## 2020-05-12 NOTE — Evaluation (Signed)
Occupational Therapy Evaluation Patient Details Name: Lindsey Nguyen MRN: [id number] DOB: 1951/04/09 Today's Date: 05/12/2020    History of Present Illness Lindsey Nguyen is a 69yo female PMH: HTN, DM,  COPD, breast cancer (right lumpectomy and radiation therapy), tobacco abuse, who presents to [facility] ED on 8/10 c SOB x 3days, sputum, mild leg edema- pt admitted in COPD exacerbation.   Clinical Impression   Pt was seen for OT evaluation this date. Prior to hospital admission, pt was Indep with ADLs/ADL mobility, but mostly homebound since pandemic as she started working from home and endorses that her endurance has gone down. Pt lives alone in [facility]. Currently pt demonstrates impairments as described below (See OT problem list) which functionally limit her ability to perform ADL/self-care tasks. Pt currently requires CGA with standing ADLs and  ADL transfers with HHA.  Pt would benefit from skilled OT to address noted impairments and functional limitations (see below for any additional details) in order to maximize safety and independence while minimizing falls risk and caregiver burden. Upon hospital discharge, recommend HHOT to maximize pt safety and return to functional independence during meaningful occupations of daily life.     Follow Up Recommendations  Home health OT    Equipment Recommendations  3 in 1 bedside commode;Tub/shower seat;Other (comment) (2HC)    Recommendations for Other Services       Precautions / Restrictions Precautions Precautions: Fall Restrictions Weight Bearing Restrictions: No      Mobility Bed Mobility Overal bed mobility: Modified Independent                Transfers Overall transfer level: Needs assistance Equipment used: None;1 person hand held assist Transfers: Sit to/from Stand Sit to Stand: Min guard;Supervision              Balance Overall balance assessment: Needs assistance   Sitting balance-Leahy Scale: Good     Standing  balance support: Single extremity supported Standing balance-Leahy Scale: Fair Standing balance comment: furtniture cruise versus 1 UE HHA                           ADL either performed or assessed with clinical judgement   ADL Overall ADL's : Needs assistance/impaired                                       General ADL Comments: CGA with HHA for standing self care at sink. Able to perform seated ADLs with setup for [facility]     Vision Baseline Vision/History: Wears glasses Wears Glasses: At all times Patient Visual Report: No change from baseline       Perception     Praxis      Pertinent Vitals/Pain Pain Assessment: No/denies pain     Hand Dominance     Extremity/Trunk Assessment Upper Extremity Assessment Upper Extremity Assessment: Overall WFL for tasks assessed;Generalized weakness (ROM WFL, MMT grossly 3+/5)   Lower Extremity Assessment Lower Extremity Assessment: Overall WFL for tasks assessed;Generalized weakness   Cervical / Trunk Assessment Cervical / Trunk Assessment: Kyphotic   Communication Communication Communication: No difficulties   Cognition Arousal/Alertness: Awake/alert Behavior During Therapy: WFL for tasks assessed/performed;Anxious Overall Cognitive Status: Within Functional Limits for tasks assessed  General Comments       Exercises Other Exercises Other Exercises: OT facilitates ed re: safety with ADLs, role of OT, potential need for f/u after d/c and potential need for rollator upon d/c   Shoulder Instructions      Home Living Family/patient expects to be discharged to:: Private residence Living Arrangements: Alone Available Help at Discharge: Family Type of Home: House Home Access: Stairs to enter Technical brewer of Steps: 3 Entrance Stairs-Rails: Right Home Layout: One level     Bathroom Shower/Tub: Occupational psychologist: Standard      Home Equipment: [place] held shower head          Prior Functioning/Environment Level of Independence: Needs assistance  Gait / Transfers Assistance Needed: Pt endorses furniture cruising in the home when fatigued. Primarily Indep with fxl mobility ADL's / Homemaking Assistance Needed: Pt reports she has to complete ADLs one a time, able to perform all BADLs I'ly. Pt hires assist Nurse, mental health) for cleaning/cooking.   Comments: Orders groceries online; Has a fear of falls, but denies any recent falls in past 12 months.        OT Problem List: Decreased strength;Decreased activity tolerance;Impaired balance (sitting and/or standing);Decreased knowledge of use of DME or AE;Cardiopulmonary status limiting activity      OT Treatment/Interventions: Self-care/ADL training;Therapeutic exercise;Energy conservation;DME and/or AE instruction;Therapeutic activities;Patient/family education;Balance training    OT Goals(Current goals can be found in the care plan section) Acute Rehab OT Goals Patient Stated Goal: Return to home, stay safe, avoid falls OT Goal Formulation: With patient Time For Goal Achievement: 05/25/2020 Potential to Achieve Goals: Good ADL Goals Pt Will Perform Grooming: with supervision;standing (with LRAD) Pt Will Perform Lower Body Dressing: with supervision;with adaptive equipment;sit to/from stand Pt Will Transfer to Toilet: with supervision;bedside commode Additional ADL Goal #1: Pt will verbalize 3 EC strategies for ADLs with 0% verbal cues.  OT Frequency: Min 1X/week   Barriers to D/C:            Co-evaluation              AM-PAC OT "6 Clicks" Daily Activity     Outcome Measure Help from another person eating meals?: None Help from another person taking care of personal grooming?: A Little Help from another person toileting, which includes using toliet, bedpan, or urinal?: A Little Help from another person bathing (including washing, rinsing,  drying)?: A Little Help from another person to put on and taking off regular upper body clothing?: None Help from another person to put on and taking off regular lower body clothing?: A Little 6 Click Score: 20   End of Session Equipment Utilized During Treatment: Gait belt  Activity Tolerance: Patient tolerated treatment well Patient left: in bed;with call bell/phone within reach  OT Visit Diagnosis: Unsteadiness on feet (R26.81);Muscle weakness (generalized) (M62.81)                Time: 6948-5462 OT Time Calculation (min): 45 min Charges:  OT General Charges $OT Visit: 1 Visit OT Evaluation $OT Eval Moderate Complexity: 1 Mod OT Treatments $Self Care/Home Management : 8-22 mins $Therapeutic Activity: 8-22 mins  Gerrianne Scale, MS, OTR/L ascom [phone] 05/12/20, 4:38 PM

## 2020-05-12 NOTE — Progress Notes (Addendum)
Initial Nutrition Assessment  DOCUMENTATION CODES:   Severe malnutrition in context of chronic illness  INTERVENTION:   Magic cup TID with meals, each supplement provides 290 kcal and 9 grams of protein  MVI daily   Agree with liberal diet  Pt declines ONS and snacks   Pt at refeed risk; recommend monitor K, Mg and P labs daily until stable  NUTRITION DIAGNOSIS:   Severe Malnutrition related to chronic illness (COPD, breast cancer) as evidenced by severe fat depletion, severe muscle depletion.  GOAL:   Patient will meet greater than or equal to 90% of their needs  MONITOR:   PO intake, Labs, Weight trends, Skin, I & O's  REASON FOR ASSESSMENT:   Consult Assessment of nutrition requirement/status  ASSESSMENT:   69 y.o. female with medical history significant of hypertension, diabetes mellitus, COPD, breast cancer (right lumpectomy and radiation therapy), tobacco abuse and alcohol use who presents with shortness breath.  Met with pt in room today. Pt is well known to this RD from multiple previous admits. Pt reports poor appetite and oral intake for the past 6 days. Pt reports that she has been so dehydrated that there was not enough saliva in her mouth to chew and swallow food. Pt reports that she did eat 100% of eggs, bacon and a english muffin for breakfast this morning. Pt does not like nutritional supplements and she declines these during every admit. Pt was offered snacks today but she declined this as well. RD will add Magic Cups to meal trays. RD agrees with regular diet. Per chart, pt is weight stable pta.   Medications reviewed and include: azithromycin, lovenox, folic acid, insulin, solu-medrol, nicotine, thiamine    Labs reviewed: Na 132(L), BUN 25(H) P 3.7 wnl, Mg 1.9 wnl Wbc- 14.9(H)  NUTRITION - FOCUSED PHYSICAL EXAM:    Most Recent Value  Orbital Region Severe depletion  Upper Arm Region Severe depletion  Thoracic and Lumbar Region Severe depletion   Buccal Region Severe depletion  Temple Region Severe depletion  Clavicle Bone Region Severe depletion  Clavicle and Acromion Bone Region Severe depletion  Scapular Bone Region Severe depletion  Dorsal Hand Severe depletion  Patellar Region Severe depletion  Anterior Thigh Region Severe depletion  Posterior Calf Region Severe depletion  Edema (RD Assessment) Mild  Hair Reviewed  Eyes Reviewed  Mouth Reviewed  Skin Reviewed  Nails Reviewed     Diet Order:   Diet Order            DIET DYS 3 Room service appropriate? Yes; Fluid consistency: Thin  Diet effective now                EDUCATION NEEDS:   Education needs have been addressed  Skin:  Skin Assessment: Reviewed RN Assessment  Last BM:  8/10  Height:   Ht Readings from Last 1 Encounters:  05/10/20 '5\' 3"'$  (1.6 m)    Weight:   Wt Readings from Last 1 Encounters:  05/10/20 30.8 kg    Ideal Body Weight:  52.3 kg  BMI:  Body mass index is 12.05 kg/m.  Estimated Nutritional Needs:   Kcal:  1200-1400kcal/day  Protein:  50-60g/day  Fluid:  800-981m/day  CKoleen DistanceMS, RD, LDN Please refer to [facility] LLCfor RD and/or RD on-call/weekend/after hours pager

## 2020-05-12 NOTE — Consult Note (Signed)
Date: 05/12/2020,   MRN# [id number] Lindsey Nguyen 1951/03/10 Code Status:     Code Status Orders  (From admission, onward)         Start     Ordered   05/11/20 0811  Full code  Continuous        05/11/20 0812        Code Status History    Date Active Date Inactive Code Status Order ID Comments User Context   07/27/2019 2313 07/29/2019 1740 Full Code [id number]  Lang Snow, NP ED   10/28/2017 2052 10/31/2017 1734 Full Code [id number]  Henreitta Leber, MD Inpatient   04/10/2016 1217 04/10/2016 1935 Full Code [id number]  Schnier, Dolores Lory, MD Inpatient   Advance Care Planning Activity     Hosp day:@LENGTHOFSTAYDAYS @ Referring MD: @ATDPROV @     PCP:      AdmissionWeight: 30.8 kg                 CurrentWeight: 30.8 kg Lindsey Nguyen is a 69 y.o. old female seen in consultation for copd exacerbation at the request of Dr. Posey Pronto..   CC: copd exacerbation/pulm nodules  HPI: This is a 69 yr old lady well known to me, very severe copd, continues to smoke, an Optometrist by trade, known pulm nodules, and quite this, poor po intake who presented with sob, weakness, worried about pneumonia and developing respiratory distress. She was brought here by EMS. Treated appropriately and is responding. She seem to think the breo/spiriva combo did better than trelegy. She want to go back to breo/spiriva.   She denies pleurisy, angina, edema leg pain, ectopy, syncope, hemoptysis,gerd or significant post nasal drainage. She takes claritin at home.   COMPARISON:  07/27/2019  FINDINGS: Cardiac silhouette is normal in size. No mediastinal or hilar masses or evidence of adenopathy.  Lungs are markedly hyperexpanded. Thickened interstitial markings. No evidence of pneumonia or pulmonary edema.  No pleural effusion or pneumothorax.  Skeletal structures are intact.  IMPRESSION: 1. No acute cardiopulmonary disease. 2. Advanced COPD with chronic interstitial  thickening.   Electronically Signed   By: Lajean Manes M.D.   On: 05/10/2020 16:26  SPIROMETRY: 2017 FVC was 2.06 liters, 73% of predicted/Post 2.27, 81%, 10% Change FEV1 was 0.76, 34% of predicted/Post 0.83, 37%, 8% Change FEV1 ratio was 37/Post 36 FEF 25-75% liters per second was 10% of predicted/Post 9%, -7% Change *SVN given with 2.5 mg Albuterol for Post Spirometry.  LUNG VOLUMES: TLC was 119% of predicted RV was 194% of predicted  DIFFUSION CAPACITY: DLCO was 59% of predicted DLCO/VA was 49% of predicted  FLOW VOLUME LOOP: Obstruction  Impression Severe obstruction, late stage III copd Lung volumes showed air trapping DLCO is moderately decreased    PMHX:   Past Medical History:  Diagnosis Date  . Cancer [facility])    right breast cancer with lumpectomy and rad tx  . COPD (chronic obstructive pulmonary disease) ([place])   . Diabetes mellitus without complication ([place])   . Hypertension   . Osteoporosis    Surgical Hx:  Past Surgical History:  Procedure Laterality Date  . BREAST LUMPECTOMY     x 2  . BUNIONECTOMY Right   . COLONOSCOPY     normal  . PERIPHERAL VASCULAR CATHETERIZATION N/A 04/10/2016   Procedure: Lower Extremity Angiography;  Surgeon: Katha Cabal, MD;  Location: [place] [facility];  Service: Cardiovascular;  Laterality: N/A;  . PERIPHERAL VASCULAR CATHETERIZATION  04/10/2016   Procedure: Lower Extremity  Intervention;  Surgeon: Katha Cabal, MD;  Location: [place] [facility];  Service: Cardiovascular;;  . TUBAL LIGATION     Family Hx:  Family History  Problem Relation Age of Onset  . Diabetes Father   . Kidney cancer Father   . Cancer Brother   . Diabetes Paternal Aunt   . Diabetes Paternal Uncle   . Kidney failure Mother    Social Hx:   Social History   Tobacco Use  . Smoking status: Current Every Day Smoker    Packs/day: 1.00    Years: 50.00    Pack years: 50.00    Types: Cigarettes  . Smokeless tobacco: Never Used   Substance Use Topics  . Alcohol use: Yes    Alcohol/week: 21.0 standard drinks    Types: 14 Glasses of wine, 7 Cans of beer per week  . Drug use: No   Medication:    Home Medication:    Current Medication: @CURMEDTAB @   Allergies:  Tetanus toxoids  Review of Systems: Gen:  Denies  fever, sweats, chills HEENT: Denies blurred vision, double vision, ear pain, eye pain, hearing loss, nose bleeds, sore throat Cvc:  No dizziness, chest pain or heaviness Resp: acute on chronic dyspnea, less so today.ess wheezing   Gi: Denies swallowing difficulty, stomach pain, nausea or vomiting, diarrhea, constipation, bowel incontinence Gu:  Denies bladder incontinence, burning urine Ext:   No Joint pain, stiffness or swelling Skin: No skin rash, easy bruising or bleeding or hives Endoc:  No polyuria, polydipsia , polyphagia or weight change Psych: No depression, insomnia or hallucinations  Other:  All other systems negative  Physical Examination:   VS: BP (!) 163/93 (BP Location: Left Arm)   Pulse (!) 110   Temp 98.2 F (36.8 C) (Oral)   Resp 20   Ht 5\' 3"  (1.6 m)   Wt 30.8 kg   SpO2 97%   BMI 12.05 kg/m   General Appearance: No distress,suitting up in bed, no distress o2 off, very thin and frial  Neuro: without focal findings, mental status, speech normal, alert and oriented, cranial nerves 2-12 intact, reflexes normal and symmetric, sensation grossly normal  HEENT: PERRLA, EOM intact, no ptosis, no other lesions noticed, poor sentition: NECK: Supple, no nodes, cachitic Pulmonary:.Tympnuc, rare wheezing, No rales   Cardiovascular:  Normal S1,S2.  No m/r/g.  Abdominal aorta pulsation normal.    Abdomen:scaphoid, benign, Soft, non-tender, No masses, hepatosplenomegaly, No lymphadenopathy Endoc: No evident thyromegaly, no signs of acromegaly or Cushing features Skin:   warm, no rashes, no ecchymosis  Extremities: normal, no cyanosis, clubbing, no edema, warm with normal capillary  refill.    Labs results:   Recent Labs    05/10/20 1545 05/11/20 1728 05/12/20 0123  HGB 14.5  --   --   HCT 43.3  --   --   MCV 94.1  --   --   WBC 14.9*  --   --   BUN 25* 24* 25*  CREATININE 0.69 0.86 0.91  GLUCOSE 142* 120* 129*  CALCIUM 9.4 8.7* 9.0  ,  IMPRESSION: Decreasing size of the previously seen right upper lobe nodule, now 4 mm compared 8 mm previously. The previously seen 5 mm left upper lobe nodule has enlarged, now measuring up to 9 mm and a 2nd right upper lobe nodule is noted, measuring up to 10 mm. This superimposed on a background of severe centrilobular emphysema. Additional small 4 mm left upper lobe nodule. Cannot exclude primary lung  cancer or metastases. Recommend close interval follow-up with repeat CT in 3-6 months or further evaluation with PET CT.  Aortic Atherosclerosis (ICD10-I70.0) and Emphysema (ICD10-J43.9).  Electronically Signed   By: Rolm Baptise M.D.   On: 07/28/2019 13:42 IMPRESSION: 1. Pulmonary nodules that are stable, perhaps slightly decreased in size (with respect to the right upper lobe area on (image 77, series 3) compared to most recent CT evaluation. The show no activity greater than background blood pool at this time. There are some features which would suggest sequela of prior infection or inflammation well indolent/low-grade neoplasm remains a consideration. Short interval follow-up at 3 month interval may be helpful to determine further management. 2. Signs of atherosclerosis with dense aortic calcification and left carotid calcification as described. 3. Marked pulmonary emphysema.  Electronically Signed   By: Zetta Bills M.D.   On: 09/09/2019 14:47  Assessment and Plan: Very severecopd,stillsmoking.came with copd exacerbation, improved possible home tomorrow.  Did  qualify for portable oxygen. She tinks trelegy is not as goos as breo/spiriva.  Continue  albuterol prn D/c home on breo one puff q  day and spiriva hand inhaler I cap q day.  Stop smoking advised strongly Prednisone taper Flutter valve Daliresp 250 mg q day Portable 02 at 2 liters [facility]  Pulmonary nodules, folowing Chest ct in 4 weeks    I have personally obtained a history, examined the patient, evaluated laboratory and imaging results, formulated the assessment and plan and placed orders.  The Patient requires high complexity decision making for assessment and support, frequent evaluation and titration of therapies, application of advanced monitoring technologies and extensive interpretation of multiple databases.   Nashika Coker,M.D. Pulmonary [facility]

## 2020-05-13 LAB — CREATININE, SERUM
Creatinine, Ser: 0.54 mg/dL (ref 0.44–1.00)
GFR calc Af Amer: >60 mL/min (ref >60)
GFR calc non Af Amer: >60 mL/min (ref >60)

## 2020-05-13 LAB — GLUCOSE, CAPILLARY
Glucose-Capillary: 121 mg/dL — ABNORMAL HIGH (ref 70–99)
Glucose-Capillary: 166 mg/dL — ABNORMAL HIGH (ref 70–99)

## 2020-05-13 MED ORDER — FLUTICASONE FUROATE-VILANTEROL 200-25 MCG/INH IN AEPB: 1.0000 | INHALATION_SPRAY | Freq: Every day | RESPIRATORY_TRACT | 1 refills | Status: AC

## 2020-05-13 MED ORDER — PREDNISONE 10 MG PO TABS: ORAL_TABLET | ORAL | 0 refills | Status: AC

## 2020-05-13 MED ORDER — AZITHROMYCIN 250 MG PO TABS: ORAL_TABLET | ORAL | 0 refills | Status: DC

## 2020-05-13 MED ORDER — FLUTICASONE FUROATE-VILANTEROL 200-25 MCG/INH IN AEPB
1.0000 | INHALATION_SPRAY | Freq: Every day | RESPIRATORY_TRACT | Status: DC
  Administered 2020-05-13: 1 via RESPIRATORY_TRACT
  Filled 2020-05-13: qty 28

## 2020-05-13 MED ORDER — TIOTROPIUM BROMIDE MONOHYDRATE 18 MCG IN CAPS: 18.0000 ug | ORAL_CAPSULE | Freq: Every day | RESPIRATORY_TRACT | 12 refills | Status: AC

## 2020-05-13 MED ORDER — PREDNISONE 50 MG PO TABS
50.0000 mg | ORAL_TABLET | Freq: Every day | ORAL | Status: DC
  Administered 2020-05-13: 50 mg via ORAL
  Filled 2020-05-13 (×2): qty 1

## 2020-05-13 MED ORDER — TIOTROPIUM BROMIDE MONOHYDRATE 18 MCG IN CAPS
18.0000 ug | ORAL_CAPSULE | Freq: Every day | RESPIRATORY_TRACT | Status: DC
  Administered 2020-05-13: 18 ug via RESPIRATORY_TRACT
  Filled 2020-05-13: qty 5

## 2020-05-13 MED ORDER — ADULT MULTIVITAMIN W/MINERALS CH: 1.0000 | ORAL_TABLET | Freq: Every day | ORAL | 0 refills | Status: AC

## 2020-05-13 MED ORDER — IPRATROPIUM-ALBUTEROL 0.5-2.5 (3) MG/3ML IN SOLN: 3.0000 mL | Freq: Four times a day (QID) | RESPIRATORY_TRACT | 1 refills | Status: AC | PRN

## 2020-05-13 MED ORDER — IPRATROPIUM-ALBUTEROL 0.5-2.5 (3) MG/3ML IN SOLN: 3.0000 mL | Freq: Four times a day (QID) | RESPIRATORY_TRACT | Status: DC | PRN

## 2020-05-13 NOTE — Discharge Instructions (Signed)
Wear oxygen as instructed Smoking cessation advised

## 2020-05-13 NOTE — Discharge Summary (Signed)
[place] [facility] [place] NAME: Lindsey Nguyen    MR#:  [id number]  DATE OF BIRTH:  [birth year]  DATE OF ADMISSION:  05/11/2020 ADMITTING PHYSICIAN: Ivor Costa, MD  DATE OF DISCHARGE: 05/13/2020  PRIMARY CARE PHYSICIAN: Juline Patch, MD    ADMISSION DIAGNOSIS:  Hyponatremia [E87.1] COPD exacerbation (HCC) [J44.1] Acute respiratory failure with hypoxia (HCC) [J96.01] COPD with acute exacerbation (HCC) [J44.1]  DISCHARGE DIAGNOSIS:  Acute on chronic respiratory failure secondary to COPD end-stage-- now on oxygen  SECONDARY DIAGNOSIS:   Past Medical History:  Diagnosis Date  . Cancer [facility])    right breast cancer with lumpectomy and rad tx  . COPD (chronic obstructive pulmonary disease) ([place])   . Diabetes mellitus without complication ([place])   . Hypertension   . Osteoporosis     HOSPITAL COURSE:   Lindsey Nguyen a 69 y.o.femalewith medical history significant ofhypertension, diabetes mellitus, COPD, breast cancer (right lumpectomy and radiation therapy), tobacco abuse, alcohol use, who presents with shortness breath. Patient states that he has shortness of breath for more than 3 days, which has been progressively worsening. He has productive cough with rusty sputum production.Chest x-ray is negative for infiltration, but showed COPD. Left lower leg venous Dopplers negative for DVT.  Acute on chronic respiratory failure with hypoxiadue toCOPD with acute exacerbation (HCC)-- steroid dependent severe COPD (on 20 mg PO prednisone at home) -Bronchodilators prn -recommendation be discharged with nebulizer this time to home. -Solu-Medrol20 mg IV tid--prednisone taper and then 20 mg qd (chronic) -Z pak -- empirically. Chest x-ray negative for pneumonia -Dr. Raul Del pulmonary consultation noted -Mucinex for cough  -Incentive spirometry -Nasal cannula oxygen as neededto maintain O2 saturation 93% or greater -pt now qualifies for home  oxygen.  Essential hypertension -IV hydralazine. -resume lisinopril   Diabetes mellitus without complication [facility]): Recent A1c 5.9, well controlled.  -Patient is taking Metformin at home -Sliding scale insulin  Tobacco abuse and Alcohol abuse: -Did counseling about importance of quitting smokinganddrinkingalcohol -Nicotine patch -CIWA protocol-- scoring low--d/c it  Hyponatremia: Na 126,mental status normal.  -Most likely due to alcohol abuse sodium 132-will give heart healthy diet   Nutrition Status: Nutrition Problem: Severe Malnutrition Etiology: chronic illness (COPD, breast cancer) Signs/Symptoms: severe fat depletion, severe muscle depletion Interventions: MVI, Magic cup patient does not like any supplements, magic cup, vitamins recommended by dietitian during this admission and previous admission.   DVT ppx: SQ Lovenox Code Status:Full code Family Communication: not done, no family member is at bed side.  Disposition Plan: Anticipate discharge back to previous environment Consults called:None Admission status: inpatient Dispo: The patient is from:Home Anticipated d/c is BZ:JIRC Anticipated d/c date is: today Patient currently is medically stable to d/c and best as baseline  CONSULTS OBTAINED:  Treatment Team:  Kathie Rhodes, MD Erby Pian, MD  DRUG ALLERGIES:   Allergies  Allergen Reactions  . Tetanus Toxoids Swelling    DISCHARGE MEDICATIONS:   Allergies as of 05/13/2020      Reactions   Tetanus Toxoids Swelling      Medication List    TAKE these medications   albuterol 108 (90 Base) MCG/ACT inhaler Commonly known as: VENTOLIN HFA TAKE 2 PUFFS BY MOUTH EVERY 6 HOURS AS NEEDED FOR WHEEZE OR SHORTNESS OF BREATH   azithromycin 250 MG tablet Commonly known as: ZITHROMAX Take daily Start taking on: May 14, 2020   fluticasone furoate-vilanterol 200-25 MCG/INH Aepb Commonly  known as: BREO ELLIPTA Inhale 1 puff  into the lungs daily. Start taking on: May 14, 2020   ipratropium-albuterol 0.5-2.5 (3) MG/3ML Soln Commonly known as: DUONEB Take 3 mLs by nebulization every 6 (six) hours as needed.   lisinopril 10 MG tablet Commonly known as: ZESTRIL TAKE 1 TABLET (10 MG TOTAL) BY MOUTH 2 (TWO) TIMES DAILY BEFORE A MEAL.   loratadine 10 MG tablet Commonly known as: CLARITIN TAKE 1 TABLET BY MOUTH EVERY DAY What changed:   when to take this  reasons to take this   metFORMIN 500 MG tablet Commonly known as: GLUCOPHAGE TAKE 1 TABLET BY MOUTH EVERY DAY What changed: when to take this   multivitamin with minerals Tabs tablet Take 1 tablet by mouth daily. Start taking on: May 14, 2020   predniSONE 10 MG tablet Commonly known as: DELTASONE Take 50 mg daily taper by 10 mg daily and thereafter 20 mg qd Start taking on: May 14, 2020 What changed:   medication strength  how much to take  how to take this  when to take this  additional instructions   tiotropium 18 MCG inhalation capsule Commonly known as: SPIRIVA Place 1 capsule (18 mcg total) into inhaler and inhale daily. Start taking on: May 14, 2020            Durable Medical Equipment  (From admission, onward)         Start     Ordered   05/12/20 1217  For home use only DME oxygen  Once       Question Answer Comment  Length of Need 6 Months   Mode or (Route) Nasal cannula   Frequency Continuous (stationary and portable oxygen unit needed)   Oxygen conserving device Yes   Oxygen delivery system Gas      05/12/20 1216   05/12/20 0907  For home use only DME Nebulizer machine  Once       Question Answer Comment  Patient needs a nebulizer to treat with the following condition COPD (chronic obstructive pulmonary disease) ([place])   Length of Need Lifetime      05/12/20 0907          If you experience worsening of your admission symptoms, develop shortness of breath,  life threatening emergency, suicidal or homicidal thoughts you must seek medical attention immediately by calling 911 or calling your MD immediately  if symptoms less severe.  You Must read complete instructions/literature along with all the possible adverse reactions/side effects for all the Medicines you take and that have been prescribed to you. Take any new Medicines after you have completely understood and accept all the possible adverse reactions/side effects.   Please note  You were cared for by a hospitalist during your hospital stay. If you have any questions about your discharge medications or the care you received while you were in the hospital after you are discharged, you can call the unit and asked to speak with the hospitalist on call if the hospitalist that took care of you is not available. Once you are discharged, your primary care physician will handle any further medical issues. Please note that NO REFILLS for any discharge medications will be authorized once you are discharged, as it is imperative that you return to your primary care physician (or establish a relationship with a primary care physician if you do not have one) for your aftercare needs so that they can reassess your need for medications and monitor your lab values. Today   SUBJECTIVE  Overall improving  VITAL SIGNS:  Blood pressure (!) 161/83, pulse 98, temperature (!) 97.5 F (36.4 C), temperature source Oral, resp. rate 20, height 5\' 3"  (1.6 m), weight 30.8 kg, SpO2 (!) 79 %.  I/O:    Intake/Output Summary (Last 24 hours) at 05/13/2020 1055 Last data filed at 05/13/2020 0957 Gross per 24 hour  Intake 600 ml  Output --  Net 600 ml    PHYSICAL EXAMINATION:  GENERAL:  69 y.o.-year-old patient lying in the bed with no acute distress. Thin, cachectic EYES: Pupils equal, round, reactive to light and accommodation. No scleral icterus.  HEENT: Head atraumatic, normocephalic. Oropharynx and nasopharynx clear.   NECK:  Supple, no jugular venous distention. No thyroid enlargement, no tenderness.  LUNGS: diminished/distant breath sounds bilaterally, no wheezing, rales,rhonchi or crepitation. No use of accessory muscles of respiration.  CARDIOVASCULAR: S1, S2 normal. No murmurs, rubs, or gallops.  ABDOMEN: Soft, non-tender, non-distended. Bowel sounds present. No organomegaly or mass.  EXTREMITIES: No pedal edema, cyanosis, or clubbing.  NEUROLOGIC: Cranial nerves II through XII are intact. Muscle strength 5/5 in all extremities. Sensation intact. Gait not checked.  PSYCHIATRIC:  patient is alert and oriented x 3.  SKIN: No obvious rash, lesion, or ulcer.   DATA REVIEW:   CBC  Recent Labs  Lab 05/10/20 1545  WBC 14.9*  HGB 14.5  HCT 43.3  PLT 319    Chemistries  Recent Labs  Lab 05/11/20 1621 05/11/20 1728 05/12/20 0123 05/12/20 0123 05/13/20 0537  NA  --    < > 132*  --   --   K  --    < > 4.3  --   --   CL  --    < > 97*  --   --   CO2  --    < > 25  --   --   GLUCOSE  --    < > 129*  --   --   BUN  --    < > 25*  --   --   CREATININE  --    < > 0.91   < > 0.54  CALCIUM  --    < > 9.0  --   --   MG 1.9  --   --   --   --    < > = values in this interval not displayed.    Microbiology Results   Recent Results (from the past 240 hour(s))  SARS Coronavirus 2 by RT PCR (hospital order, performed in [facility] hospital lab) Nasopharyngeal Nasopharyngeal Swab     Status: None   Collection Time: 05/11/20  6:06 AM   Specimen: Nasopharyngeal Swab  Result Value Ref Range Status   SARS Coronavirus 2 NEGATIVE NEGATIVE Final    Comment: (NOTE) SARS-CoV-2 target nucleic acids are NOT DETECTED.  The SARS-CoV-2 RNA is generally detectable in upper and lower respiratory specimens during the acute phase of infection. The lowest concentration of SARS-CoV-2 viral copies this assay can detect is 250 copies / mL. A negative result does not preclude SARS-CoV-2 infection and should not be  used as the sole basis for treatment or other patient management decisions.  A negative result may occur with improper specimen collection / handling, submission of specimen other than nasopharyngeal swab, presence of viral mutation(s) within the areas targeted by this assay, and inadequate number of viral copies (<250 copies / mL). A negative result must be combined with clinical observations, patient history, and epidemiological information.  Fact  Sheet for Patients:   StrictlyIdeas.no  Fact Sheet for Healthcare Providers: BankingDealers.co.za  This test is not yet approved or  cleared by the [place] FDA and has been authorized for detection and/or diagnosis of SARS-CoV-2 by FDA under an Emergency Use Authorization (EUA).  This EUA will remain in effect (meaning this test can be used) for the duration of the COVID-19 declaration under Section 564(b)(1) of the Act, 21 U.S.C. section 360bbb-3(b)(1), unless the authorization is terminated or revoked sooner.  Performed at [facility], [address] [place], [place] [address]   Culture, blood (routine x 2) Call MD if unable to obtain prior to antibiotics being given     Status: None (Preliminary result)   Collection Time: 05/11/20  8:14 AM   Specimen: BLOOD  Result Value Ref Range Status   Specimen Description BLOOD BLOOD LEFT FOREARM  Final   Special Requests   Final    BOTTLES DRAWN AEROBIC AND ANAEROBIC Blood Culture adequate volume   Culture   Final    NO GROWTH 2 DAYS Performed at [facility], [address], [place], [place] [address]    Report Status PENDING  Incomplete  Culture, blood (routine x 2) Call MD if unable to obtain prior to antibiotics being given     Status: None (Preliminary result)   Collection Time: 05/11/20  8:14 AM   Specimen: BLOOD  Result Value Ref Range Status   Specimen Description BLOOD RIGHT ANTECUBITAL  Final   Special  Requests   Final    BOTTLES DRAWN AEROBIC AND ANAEROBIC Blood Culture adequate volume   Culture   Final    NO GROWTH 2 DAYS Performed at [facility], [address], [place], [place] [address]    Report Status PENDING  Incomplete  Culture, sputum-assessment     Status: None   Collection Time: 05/11/20  8:14 AM   Specimen: Sputum  Result Value Ref Range Status   Specimen Description SPUTUM  Final   Special Requests NONE  Final   Sputum evaluation   Final    Sputum specimen not acceptable for testing.  Please recollect.   REQUEST FOR RECOLLECT CALLED TO YESSICA AGUAS AT 1330 ON 05/11/2020 [place]. Performed at [facility], [address] [place], [place] [address]    Report Status 05/11/2020 FINAL  Final    RADIOLOGY:  No results found.   CODE STATUS:     Code Status Orders  (From admission, onward)         Start     Ordered   05/11/20 0811  Full code  Continuous        05/11/20 0812        Code Status History    Date Active Date Inactive Code Status Order ID Comments User Context   07/27/2019 2313 07/29/2019 1740 Full Code [id number]  Lang Snow, NP ED   10/28/2017 2052 10/31/2017 1734 Full Code [id number]  Henreitta Leber, MD Inpatient   04/10/2016 1217 04/10/2016 1935 Full Code [id number]  Schnier, Dolores Lory, MD Inpatient   Advance Care Planning Activity       TOTAL TIME TAKING CARE OF THIS PATIENT: *35* minutes.    Fritzi Mandes M.D  [facility]    CC: Primary care physician; Juline Patch, MD

## 2020-05-13 NOTE — TOC Transition Note (Signed)
Transition of Care [facility]) - CM/SW Discharge Note   Patient Details  Name: Lindsey Nguyen MRN: [id number] Date of Birth: 08-25-1951  [facility] CM/SW Contact:  Eileen Stanford, LCSW Phone Number: 05/13/2020, 10:58 AM   Clinical Narrative:   [place] Voucher provided to RN--awaiting oxygen and pt to notify [facility] aid. NO additional needs at this time.    Final next level of care: Home/Self Care Barriers to Discharge: No Barriers Identified   Patient Goals and CMS Choice Patient states their goals for this hospitalization and ongoing recovery are:: to go home   Choice offered to / list presented to : Patient  Discharge Placement                Patient to be transferred to facility by: pt will be transfered home via taxi   Patient and family notified of of transfer: 05/13/20  Discharge Plan and Services In-house Referral: Clinical Social Work   Post Acute Care Choice: NA          DME Arranged: Oxygen, Nebulizer machine DME Agency: [employer] Date DME Agency Contacted: 05/13/20 Time DME Agency Contacted: 75 Representative spoke with at DME Agency: [facility] HH Arranged: NA          Social Determinants of Health (SDOH) Interventions     Readmission Risk Interventions No flowsheet data found.

## 2020-05-13 NOTE — TOC Initial Note (Addendum)
Transition of Care [facility]) - Initial/Assessment Note    Patient Details  Name: Lindsey Nguyen MRN: [id number] Date of Birth: 1951-05-20  Transition of Care [facility]) CM/SW Contact:    Eileen Stanford, LCSW Phone Number: 05/13/2020, 10:54 AM  Clinical Narrative: Pt is alert and oriented. Pt lives alone. Pt states he has [place] services through [employer] and does not need any additional services. Pt is refusing walker at this time. Pt states she needs transport home. Pt states she can arrange for her aid to be at her home when she is d/c to help her get inside. Oxygen and nebulizer was ordered through [employer] and will be delivered at bedside prior to d/c. RN was given a taxi voucher for when oxygen has been delivered and pt has arranged her aid to be at her home. RN aware.              Expected Discharge Plan: Home/Self Care Barriers to Discharge: No Barriers Identified   Patient Goals and CMS Choice Patient states their goals for this hospitalization and ongoing recovery are:: to go home   Choice offered to / list presented to : Patient  Expected Discharge Plan and Services Expected Discharge Plan: Home/Self Care In-house Referral: Clinical Social Work   Post Acute Care Choice: NA Living arrangements for the past 2 months: Single Family Home Expected Discharge Date: 05/13/20                                    Prior Living Arrangements/Services Living arrangements for the past 2 months: Single Family Home Lives with:: Self Patient language and need for interpreter reviewed:: Yes Do you feel safe going back to the place where you live?: Yes      Need for Family Participation in Patient Care: Yes (Comment) Care giver support system in place?: Yes (comment) Current home services:  ([facility]) Criminal Activity/Legal Involvement Pertinent to Current Situation/Hospitalization: No - Comment as needed  Activities of Daily Living Home Assistive Devices/Equipment: Shower chair  with back ADL Screening (condition at time of admission) Patient's cognitive ability adequate to safely complete daily activities?: Yes Is the patient deaf or have difficulty hearing?: No Does the patient have difficulty seeing, even when wearing glasses/contacts?: No Does the patient have difficulty concentrating, remembering, or making decisions?: No Patient able to express need for assistance with ADLs?: Yes Does the patient have difficulty dressing or bathing?: Yes Independently performs ADLs?: No Communication: Independent Dressing (OT): Needs assistance Is this a change from baseline?: Pre-admission baseline Grooming: Needs assistance Is this a change from baseline?: Pre-admission baseline Feeding: Independent Bathing: Needs assistance Is this a change from baseline?: Pre-admission baseline Toileting: Needs assistance Is this a change from baseline?: Pre-admission baseline In/Out Bed: Needs assistance Is this a change from baseline?: Pre-admission baseline Walks in Home: Needs assistance Is this a change from baseline?: Pre-admission baseline Does the patient have difficulty walking or climbing stairs?: Yes Weakness of Legs: None Weakness of Arms/Hands: None  Permission Sought/Granted                  Emotional Assessment Appearance:: Appears stated age Attitude/Demeanor/Rapport: Engaged Affect (typically observed): Accepting, Appropriate Orientation: : Oriented to Self, Oriented to Place, Oriented to  Time, Oriented to Situation Alcohol / Substance Use: Not Applicable Psych Involvement: No (comment)  Admission diagnosis:  Hyponatremia [E87.1] COPD exacerbation (HCC) [J44.1] Acute respiratory failure with hypoxia ([place]) [  J96.01] COPD with acute exacerbation ([place]) [J44.1] Patient Active Problem List   Diagnosis Date Noted  . COPD exacerbation ([place]) 05/12/2020  . Tobacco abuse 05/11/2020  . Alcohol use 05/11/2020  . Hyponatremia 05/11/2020  . Protein-calorie  malnutrition, severe 07/29/2019  . COPD with acute exacerbation ([place]) 07/27/2019  . Acute pansinusitis 04/02/2018  . Chronic bronchitis with acute exacerbation ([place]) 04/02/2018  . Acute on chronic respiratory failure with hypoxia ([place]) 10/28/2017  . Diabetes mellitus without complication ([place]) 72/18/2883  . Aortic atherosclerosis ([place]) 05/08/2016  . Allergic rhinitis due to pollen 05/08/2016  . Excessive weight loss 02/28/2016  . Essential hypertension 05/19/2015  . Prediabetes 05/19/2015  . Osteoporosis 05/19/2015  . COLD (chronic obstructive lung disease) ([place]) 05/19/2015   PCP:  Juline Patch, MD Pharmacy:   [id number] - [place], [place] [address] Phone: [phone] Fax: [phone]     Social Determinants of Health (SDOH) Interventions    Readmission Risk Interventions No flowsheet data found.

## 2020-05-13 NOTE — Progress Notes (Signed)
Physical Therapy Treatment Patient Details Name: Lindsey Nguyen MRN: [id number] DOB: [birth year] Today's Date: 05/13/2020    History of Present Illness Lindsey Nguyen is a 69yo female PMH: HTN, DM,  COPD, breast cancer (right lumpectomy and radiation therapy), tobacco abuse, who presents to [facility] ED on 8/10 c SOB x 3days, sputum, mild leg edema- pt admitted in COPD exacerbation.    PT Comments    Pt in bed upon entry sitting tall, does not immediately recognize author from previous day. Pt appears to be in less distress and verbalizes less difficulty breathing this date. O2 is doffed upon entry, SpO2 79-80%, pt denies any SOB, initially hesitant to don nasal cannual when cued, but is open to educational intervention regarding dangers of prolonged hypoxemia. 2CN is brought to allow patient opportunity to test drive, she reports no difficulty using device, contrary to her concerns previous day. AMB tolerance is not any improved from prior session, despite use of 4WW and supplemental O2. Pt is hesitant to push herself in performance terms as she is very resolved to remain a minimally mobile person due to her baseline difficulties. Pt also reluctant to accept recommendation for 4WW at DC as she prefers to stick to her routine at home. Pt appears more steady and confident with transfers this date, but still requires a hand for stability.    Follow Up Recommendations  Home health PT     Equipment Recommendations  Other (comment) (4BS)    Recommendations for Other Services       Precautions / Restrictions Precautions Precautions: Fall Precaution Comments: O2 Restrictions Weight Bearing Restrictions: No    Mobility  Bed Mobility Overal bed mobility: Modified Independent                Transfers Overall transfer level: Needs assistance Equipment used: 4-wheeled walker Transfers: Sit to/from Stand           General transfer comment: educated on self use of 4WW locks during  transfers  Ambulation/Gait   Gait Distance (Feet): 22 Feet (2x38ft) Assistive device: 4-wheeled walker       General Gait Details: declines additional AMB distance due to DOE, SpO2 WNL on 2L/min; reports that rollator is easy to push   Stairs             Wheelchair Mobility    Modified Rankin (Stroke Patients Only)       Balance     Sitting balance-Leahy Scale: Good       Standing balance-Leahy Scale: Fair Standing balance comment: furtniture cruise versus 1 UE HHA                            Cognition Arousal/Alertness: Awake/alert Behavior During Therapy: WFL for tasks assessed/performed;Anxious (demonstrates limited insight regarding her curent medical situation this date) Overall Cognitive Status: Within Functional Limits for tasks assessed                                        Exercises      General Comments        Pertinent Vitals/Pain Pain Assessment: No/denies pain    Home Living                      Prior Function            PT Goals (current  goals can now be found in the care plan section) Acute Rehab PT Goals Patient Stated Goal: Return to home, stay safe, avoid falls PT Goal Formulation: With patient Time For Goal Achievement: 05/27/2020 Potential to Achieve Goals: Good Progress towards PT goals: Not progressing toward goals - comment    Frequency    Min 2X/week      PT Plan Current plan remains appropriate    Co-evaluation              AM-PAC PT "6 Clicks" Mobility   Outcome Measure  Help needed turning from your back to your side while in a flat bed without using bedrails?: A Little Help needed moving from lying on your back to sitting on the side of a flat bed without using bedrails?: A Little Help needed moving to and from a bed to a chair (including a wheelchair)?: A Little Help needed standing up from a chair using your arms (e.g., wheelchair or bedside chair)?: A  Little Help needed to walk in hospital room?: A Little Help needed climbing 3-5 steps with a railing? : A Little 6 Click Score: 18    End of Session Equipment Utilized During Treatment: Gait belt;Oxygen Activity Tolerance: Patient limited by fatigue Patient left: in bed;with bed alarm set;with call bell/phone within reach Nurse Communication: Mobility status PT Visit Diagnosis: Unsteadiness on feet (R26.81);Difficulty in walking, not elsewhere classified (R26.2)     Time: 7357-8978 PT Time Calculation (min) (ACUTE ONLY): 24 min  Charges:  $Therapeutic Exercise: 23-37 mins                     11:55 AM, 05/13/20 Etta Grandchild, PT, DPT Physical Therapist - [facility]  [phone] ([place])     Rollin Kotowski C 05/13/2020, 11:48 AM

## 2020-05-16 LAB — CULTURE, BLOOD (ROUTINE X 2)
Culture: NO GROWTH
Culture: NO GROWTH
Special Requests: ADEQUATE
Special Requests: ADEQUATE

## 2020-05-19 ENCOUNTER — Other Ambulatory Visit: Payer: Self-pay | Admitting: Family Medicine

## 2020-05-19 DIAGNOSIS — I10 Essential (primary) hypertension: Secondary | ICD-10-CM

## 2020-05-19 DIAGNOSIS — R7303 Prediabetes: Secondary | ICD-10-CM

## 2020-05-19 NOTE — Telephone Encounter (Signed)
Patient has appt scheduled on 05/20/20. Refill provided

## 2020-05-20 ENCOUNTER — Ambulatory Visit (INDEPENDENT_AMBULATORY_CARE_PROVIDER_SITE_OTHER): Payer: 59 | Admitting: Family Medicine

## 2020-05-20 ENCOUNTER — Inpatient Hospital Stay: Payer: 59 | Admitting: Family Medicine

## 2020-05-20 ENCOUNTER — Encounter: Payer: Self-pay | Admitting: Family Medicine

## 2020-05-20 ENCOUNTER — Other Ambulatory Visit: Payer: Self-pay

## 2020-05-20 VITALS — BP 108/68 | HR 106 | Ht 63.0 in

## 2020-05-20 DIAGNOSIS — R7303 Prediabetes: Secondary | ICD-10-CM

## 2020-05-20 DIAGNOSIS — I10 Essential (primary) hypertension: Secondary | ICD-10-CM

## 2020-05-20 DIAGNOSIS — J301 Allergic rhinitis due to pollen: Secondary | ICD-10-CM

## 2020-05-20 DIAGNOSIS — J432 Centrilobular emphysema: Secondary | ICD-10-CM | POA: Diagnosis not present

## 2020-05-20 MED ORDER — METFORMIN HCL 500 MG PO TABS: 500.0000 mg | ORAL_TABLET | Freq: Every day | ORAL | 3 refills | Status: AC

## 2020-05-20 MED ORDER — ALBUTEROL SULFATE HFA 108 (90 BASE) MCG/ACT IN AERS: INHALATION_SPRAY | RESPIRATORY_TRACT | 3 refills | Status: AC

## 2020-05-20 MED ORDER — LISINOPRIL 10 MG PO TABS: 10.0000 mg | ORAL_TABLET | Freq: Two times a day (BID) | ORAL | 3 refills | Status: AC

## 2020-05-20 MED ORDER — LORATADINE 10 MG PO TABS: 10.0000 mg | ORAL_TABLET | Freq: Every day | ORAL | 3 refills | Status: AC

## 2020-05-20 NOTE — Progress Notes (Signed)
Date:  05/20/2020   Name:  Lindsey Nguyen   DOB:  02/27/1951   MRN:  [id number]   Chief Complaint: Hospitalization Follow-up (Follow up. Still not feeling well- still having difficulty breathing. )  Patient is a 69 year old female who presents for a hospital discharge exam. The patient reports the following problems: dyspnea. Health maintenance has been reviewed  Up to date.  Hypertension This is a chronic problem. The current episode started more than 1 year ago. The problem has been gradually improving since onset. Pertinent negatives include no anxiety, blurred vision, chest pain, malaise/fatigue, palpitations, peripheral edema, shortness of breath or sweats. There are no associated agents to hypertension. There are no known risk factors for coronary artery disease. Past treatments include ACE inhibitors. The current treatment provides moderate improvement. There are no compliance problems.  There is no history of angina, kidney disease, CAD/MI, CVA, left ventricular hypertrophy, PVD or retinopathy. There is no history of chronic renal disease, a hypertension causing med or renovascular disease.  Diabetes She presents for her follow-up diabetic visit. Diabetes type: for prediabetes. Her disease course has been stable. There are no hypoglycemic associated symptoms. Pertinent negatives for hypoglycemia include no dizziness, nervousness/anxiousness or sweats. Pertinent negatives for diabetes include no blurred vision, no chest pain, no fatigue, no foot paresthesias, no foot ulcerations, no polydipsia, no polyphagia, no polyuria, no visual change, no weakness and no weight loss. There are no hypoglycemic complications. Symptoms are stable. There are no diabetic complications. Pertinent negatives for diabetic complications include no CVA, PVD or retinopathy. There are no known risk factors for coronary artery disease. Current diabetic treatment includes oral agent (monotherapy).  Breathing Problem She  complains of difficulty breathing, sputum production and wheezing. There is no chest tightness, cough, frequent throat clearing, hemoptysis, hoarse voice or shortness of breath. The current episode started more than 1 year ago. The problem occurs constantly. Associated symptoms include dyspnea on exertion. Pertinent negatives include no chest pain, malaise/fatigue, myalgias, sneezing, sweats or weight loss. Her symptoms are alleviated by beta-agonist and steroid inhaler. She reports moderate improvement on treatment.    Lab Results  Component Value Date   CREATININE 0.54 05/13/2020   BUN 25 (H) 05/12/2020   NA 132 (L) 05/12/2020   K 4.3 05/12/2020   CL 97 (L) 05/12/2020   CO2 25 05/12/2020   Lab Results  Component Value Date   CHOL 289 (H) 11/18/2015   HDL 109 11/18/2015   LDLCALC 142 (H) 11/18/2015   TRIG 192 (H) 11/18/2015   CHOLHDL 2.7 11/18/2015   Lab Results  Component Value Date   TSH 0.822 05/12/2020   Lab Results  Component Value Date   HGBA1C 5.9 (H) 09/29/2019   Lab Results  Component Value Date   WBC 14.9 (H) 05/10/2020   HGB 14.5 05/10/2020   HCT 43.3 05/10/2020   MCV 94.1 05/10/2020   PLT 319 05/10/2020   No results found for: ALT, AST, GGT, ALKPHOS, BILITOT   Review of Systems  Constitutional: Negative.  Negative for chills, fatigue, malaise/fatigue, unexpected weight change and weight loss.  HENT: Negative for congestion, ear discharge, hoarse voice, sinus pressure and sneezing.   Eyes: Negative for blurred vision, photophobia, pain, discharge, redness and itching.  Respiratory: Positive for sputum production and wheezing. Negative for cough, hemoptysis, shortness of breath and stridor.   Cardiovascular: Positive for dyspnea on exertion. Negative for chest pain and palpitations.  Gastrointestinal: Negative for blood in stool, constipation, diarrhea and  nausea.  Endocrine: Negative for cold intolerance, heat intolerance, polydipsia, polyphagia and polyuria.   Genitourinary: Negative for dysuria, flank pain, frequency, hematuria, menstrual problem, pelvic pain, urgency, vaginal bleeding and vaginal discharge.  Musculoskeletal: Negative for arthralgias, back pain and myalgias.  Allergic/Immunologic: Negative for environmental allergies and food allergies.  Neurological: Negative for dizziness, weakness, light-headedness and numbness.  Hematological: Negative for adenopathy. Does not bruise/bleed easily.  Psychiatric/Behavioral: Negative for dysphoric mood. The patient is not nervous/anxious.     Patient Active Problem List   Diagnosis Date Noted   COPD exacerbation ([place]) 05/12/2020   Tobacco abuse 05/11/2020   Alcohol use 05/11/2020   Hyponatremia 05/11/2020   Protein-calorie malnutrition, severe 07/29/2019   COPD with acute exacerbation ([place]) 07/27/2019   Acute pansinusitis 04/02/2018   Chronic bronchitis with acute exacerbation ([place]) 04/02/2018   Acute on chronic respiratory failure with hypoxia ([place]) 10/28/2017   Diabetes mellitus without complication ([place]) 77/41/2878   Aortic atherosclerosis ([place]) 05/08/2016   Allergic rhinitis due to pollen 05/08/2016   Excessive weight loss 02/28/2016   Essential hypertension 05/19/2015   Prediabetes 05/19/2015   Osteoporosis 05/19/2015   COLD (chronic obstructive lung disease) ([place]) 05/19/2015    Allergies  Allergen Reactions   Tetanus Toxoids Swelling    Past Surgical History:  Procedure Laterality Date   BREAST LUMPECTOMY     x 2   BUNIONECTOMY Right    COLONOSCOPY     normal   PERIPHERAL VASCULAR CATHETERIZATION N/A 04/10/2016   Procedure: Lower Extremity Angiography;  Surgeon: Katha Cabal, MD;  Location: [facility];  Service: Cardiovascular;  Laterality: N/A;   PERIPHERAL VASCULAR CATHETERIZATION  04/10/2016   Procedure: Lower Extremity Intervention;  Surgeon: Katha Cabal, MD;  Location: [facility];  Service: Cardiovascular;;    TUBAL LIGATION      Social History   Tobacco Use   Smoking status: Current Every Day Smoker    Packs/day: 0.75    Years: 50.00    Pack years: 37.50    Types: Cigarettes   Smokeless tobacco: Never Used  Substance Use Topics   Alcohol use: Yes    Alcohol/week: 21.0 standard drinks    Types: 14 Glasses of wine, 7 Cans of beer per week   Drug use: No     Medication list has been reviewed and updated.  Current Meds  Medication Sig   albuterol (VENTOLIN HFA) 108 (90 Base) MCG/ACT inhaler TAKE 2 PUFFS BY MOUTH EVERY 6 HOURS AS NEEDED FOR WHEEZE OR SHORTNESS OF BREATH   fluticasone furoate-vilanterol (BREO ELLIPTA) 200-25 MCG/INH AEPB Inhale 1 puff into the lungs daily.   ipratropium-albuterol (DUONEB) 0.5-2.5 (3) MG/3ML SOLN Take 3 mLs by nebulization every 6 (six) hours as needed.   lisinopril (ZESTRIL) 10 MG tablet TAKE 1 TABLET (10 MG TOTAL) BY MOUTH 2 (TWO) TIMES DAILY BEFORE A MEAL.   loratadine (CLARITIN) 10 MG tablet TAKE 1 TABLET BY MOUTH EVERY DAY (Patient taking differently: Take 10 mg by mouth daily as needed for allergies. )   metFORMIN (GLUCOPHAGE) 500 MG tablet TAKE 1 TABLET BY MOUTH EVERY DAY   Multiple Vitamin (MULTIVITAMIN WITH MINERALS) TABS tablet Take 1 tablet by mouth daily.   predniSONE (DELTASONE) 10 MG tablet Take 50 mg daily taper by 10 mg daily and thereafter 20 mg qd   tiotropium (SPIRIVA) 18 MCG inhalation capsule Place 1 capsule (18 mcg total) into inhaler and inhale daily.    PHQ 2/9 Scores 05/20/2020 09/29/2019 04/30/2019 07/23/2017  PHQ - 2 Score 2 0 0 0  PHQ- 9 Score 11 0 0 0    GAD 7 : Generalized Anxiety Score 05/20/2020 09/29/2019  Nervous, Anxious, on Edge 1 0  Control/stop worrying 3 0  Worry too much - different things 3 0  Trouble relaxing 0 0  Restless 0 0  Easily annoyed or irritable 0 0  Afraid - awful might happen 3 0  Total GAD 7 Score 10 0  Anxiety Difficulty Not difficult at all -    BP Readings from Last 3  Encounters:  05/20/20 108/68  05/13/20 (!) 163/79  09/29/19 (!) 150/80    Physical Exam Vitals and nursing note reviewed.  Constitutional:      General: She is not in acute distress.    Appearance: She is not diaphoretic.  HENT:     Head: Normocephalic and atraumatic.     Right Ear: Tympanic membrane, ear canal and external ear normal.     Left Ear: Tympanic membrane, ear canal and external ear normal.     Nose: Nose normal.  Eyes:     General:        Right eye: No discharge.        Left eye: No discharge.     Conjunctiva/sclera: Conjunctivae normal.     Pupils: Pupils are equal, round, and reactive to light.  Neck:     Thyroid: No thyromegaly.     Vascular: No JVD.  Cardiovascular:     Rate and Rhythm: Normal rate and regular rhythm.     Heart sounds: Normal heart sounds. No murmur heard.  No friction rub. No gallop.   Pulmonary:     Effort: Pulmonary effort is normal.     Breath sounds: Normal breath sounds.  Abdominal:     General: Bowel sounds are normal.     Palpations: Abdomen is soft. There is no mass.     Tenderness: There is no abdominal tenderness. There is no guarding.  Musculoskeletal:        General: Normal range of motion.     Cervical back: Normal range of motion and neck supple.  Lymphadenopathy:     Cervical: No cervical adenopathy.  Skin:    General: Skin is warm and dry.     Findings: No bruising or erythema.  Neurological:     Mental Status: She is alert.     Deep Tendon Reflexes: Reflexes are normal and symmetric.     Wt Readings from Last 3 Encounters:  05/10/20 68 lb (30.8 kg)  09/29/19 68 lb (30.8 kg)  07/27/19 70 lb (31.8 kg)    BP 108/68    Pulse (!) 106    Ht 5\' 3"  (1.6 m)    SpO2 93%    BMI 12.05 kg/m   Assessment and Plan:  1. Centrilobular emphysema (HCC) Chronic.  Controlled.  Stable.  At this point to the best of her ability she is keeping her lungs open with her long-acting albuterol and steroid inhaler per Dr. Raul Del as  well as rescue inhaler with albuterol Ventolin 2 puffs every 6 hours. - albuterol (VENTOLIN HFA) 108 (90 Base) MCG/ACT inhaler; TAKE 2 PUFFS BY MOUTH EVERY 6 HOURS AS NEEDED FOR WHEEZE OR SHORTNESS OF BREATH  Dispense: 24 g; Refill: 3  2. Essential hypertension Chronic.  Controlled.  Stable.  Continue lisinopril 10 mg 1 twice a day with meals.  Blood pressure today is 108/68. - lisinopril (ZESTRIL) 10 MG tablet; Take 1 tablet (10  mg total) by mouth 2 (two) times daily before a meal.  Dispense: 180 tablet; Refill: 3  3. Prediabetes Plan.  Controlled.  Stable.  Despite the prednisone patient has had reasonable A1c's.  We will continue on current regimen of Metformin and watch GFR is.  Blood sugars have fluctuated from 122 near 200 but this is reflecting prednisone and likely not indicative to her overall control. - metFORMIN (GLUCOPHAGE) 500 MG tablet; Take 1 tablet (500 mg total) by mouth daily.  Dispense: 90 tablet; Refill: 3  4. Seasonal allergic rhinitis due to pollen Chronic.  Controlled.  Stable.  Continue loratadine 10 mg once a day. - loratadine (CLARITIN) 10 MG tablet; Take 1 tablet (10 mg total) by mouth daily.  Dispense: 90 tablet; Refill: 3 She brought up that she would like to discuss power of attorney's both financially and medically that she would like to extend to her niece in [place] that has been discussed with over the past day or 2.  She would also like to officially place DNR orders for today this was witnessed by me and her caretaker and patient signed the form and I am acknowledging that her DNR is in effect.  We have also given her information on power of attorney for which she will proceed with filling out and has both an Forensic scientist as well as procedural all year.  She will call them and establish a time for which she can go by and sign it have a notary to acknowledge.  Patient is discussing retirement with her employment on Monday and this will be proceeding with as well.  last patient brings up that she is in the process gathering information for this past tax season that she is not been able to do due to hospitalizations.  She will contact her lawyers and determine how best to provide for this.  Her ultimate goal is that she would like to get the [place] to her niece where she can be with family and if this can be safely done after her visit with Dr. Raul Del and it was deemed possible that this could be done for an 8 to 11-hour car trip that would be to the patient's benefit to be with family.

## 2020-05-26 ENCOUNTER — Inpatient Hospital Stay
Admission: EM | Admit: 2020-05-26 | Discharge: 2020-07-01 | DRG: 190 | Disposition: E | Payer: 59 | Attending: Internal Medicine | Admitting: Internal Medicine

## 2020-05-26 ENCOUNTER — Telehealth: Payer: Self-pay

## 2020-05-26 ENCOUNTER — Telehealth: Payer: Self-pay | Admitting: Family Medicine

## 2020-05-26 ENCOUNTER — Emergency Department: Payer: 59

## 2020-05-26 ENCOUNTER — Other Ambulatory Visit: Payer: Self-pay

## 2020-05-26 ENCOUNTER — Encounter: Payer: Self-pay | Admitting: Internal Medicine

## 2020-05-26 DIAGNOSIS — Z20822 Contact with and (suspected) exposure to covid-19: Secondary | ICD-10-CM | POA: Diagnosis present

## 2020-05-26 DIAGNOSIS — Z9981 Dependence on supplemental oxygen: Secondary | ICD-10-CM | POA: Diagnosis not present

## 2020-05-26 DIAGNOSIS — Z79899 Other long term (current) drug therapy: Secondary | ICD-10-CM

## 2020-05-26 DIAGNOSIS — Y900 Blood alcohol level of less than 20 mg/100 ml: Secondary | ICD-10-CM | POA: Diagnosis present

## 2020-05-26 DIAGNOSIS — F17210 Nicotine dependence, cigarettes, uncomplicated: Secondary | ICD-10-CM | POA: Diagnosis present

## 2020-05-26 DIAGNOSIS — T380X5A Adverse effect of glucocorticoids and synthetic analogues, initial encounter: Secondary | ICD-10-CM | POA: Diagnosis present

## 2020-05-26 DIAGNOSIS — E119 Type 2 diabetes mellitus without complications: Secondary | ICD-10-CM | POA: Diagnosis present

## 2020-05-26 DIAGNOSIS — L89151 Pressure ulcer of sacral region, stage 1: Secondary | ICD-10-CM | POA: Diagnosis present

## 2020-05-26 DIAGNOSIS — L8990 Pressure ulcer of unspecified site, unspecified stage: Secondary | ICD-10-CM | POA: Insufficient documentation

## 2020-05-26 DIAGNOSIS — J9622 Acute and chronic respiratory failure with hypercapnia: Secondary | ICD-10-CM | POA: Diagnosis present

## 2020-05-26 DIAGNOSIS — E870 Hyperosmolality and hypernatremia: Secondary | ICD-10-CM | POA: Diagnosis not present

## 2020-05-26 DIAGNOSIS — J9621 Acute and chronic respiratory failure with hypoxia: Secondary | ICD-10-CM | POA: Diagnosis present

## 2020-05-26 DIAGNOSIS — Z681 Body mass index (BMI) 19 or less, adult: Secondary | ICD-10-CM | POA: Diagnosis not present

## 2020-05-26 DIAGNOSIS — Z515 Encounter for palliative care: Secondary | ICD-10-CM | POA: Diagnosis not present

## 2020-05-26 DIAGNOSIS — I10 Essential (primary) hypertension: Secondary | ICD-10-CM | POA: Diagnosis present

## 2020-05-26 DIAGNOSIS — E43 Unspecified severe protein-calorie malnutrition: Secondary | ICD-10-CM | POA: Diagnosis present

## 2020-05-26 DIAGNOSIS — Z887 Allergy status to serum and vaccine status: Secondary | ICD-10-CM

## 2020-05-26 DIAGNOSIS — R54 Age-related physical debility: Secondary | ICD-10-CM | POA: Diagnosis present

## 2020-05-26 DIAGNOSIS — Z7951 Long term (current) use of inhaled steroids: Secondary | ICD-10-CM

## 2020-05-26 DIAGNOSIS — R7401 Elevation of levels of liver transaminase levels: Secondary | ICD-10-CM | POA: Diagnosis present

## 2020-05-26 DIAGNOSIS — D72829 Elevated white blood cell count, unspecified: Secondary | ICD-10-CM | POA: Diagnosis present

## 2020-05-26 DIAGNOSIS — Z923 Personal history of irradiation: Secondary | ICD-10-CM | POA: Diagnosis not present

## 2020-05-26 DIAGNOSIS — R64 Cachexia: Secondary | ICD-10-CM | POA: Diagnosis present

## 2020-05-26 DIAGNOSIS — J441 Chronic obstructive pulmonary disease with (acute) exacerbation: Secondary | ICD-10-CM | POA: Diagnosis present

## 2020-05-26 DIAGNOSIS — Z66 Do not resuscitate: Secondary | ICD-10-CM | POA: Diagnosis present

## 2020-05-26 DIAGNOSIS — M810 Age-related osteoporosis without current pathological fracture: Secondary | ICD-10-CM | POA: Diagnosis present

## 2020-05-26 DIAGNOSIS — R0602 Shortness of breath: Secondary | ICD-10-CM | POA: Diagnosis present

## 2020-05-26 DIAGNOSIS — E876 Hypokalemia: Secondary | ICD-10-CM | POA: Diagnosis present

## 2020-05-26 DIAGNOSIS — Z853 Personal history of malignant neoplasm of breast: Secondary | ICD-10-CM

## 2020-05-26 DIAGNOSIS — Z7952 Long term (current) use of systemic steroids: Secondary | ICD-10-CM

## 2020-05-26 DIAGNOSIS — Z7984 Long term (current) use of oral hypoglycemic drugs: Secondary | ICD-10-CM

## 2020-05-26 DIAGNOSIS — Z833 Family history of diabetes mellitus: Secondary | ICD-10-CM

## 2020-05-26 DIAGNOSIS — Z7189 Other specified counseling: Secondary | ICD-10-CM | POA: Diagnosis not present

## 2020-05-26 DIAGNOSIS — F1010 Alcohol abuse, uncomplicated: Secondary | ICD-10-CM | POA: Diagnosis present

## 2020-05-26 DIAGNOSIS — E871 Hypo-osmolality and hyponatremia: Secondary | ICD-10-CM | POA: Diagnosis present

## 2020-05-26 DIAGNOSIS — Z789 Other specified health status: Secondary | ICD-10-CM

## 2020-05-26 LAB — BLOOD GAS, VENOUS
Acid-Base Excess: 4.3 mmol/L — ABNORMAL HIGH (ref 0.0–2.0)
Bicarbonate: 35.6 mmol/L — ABNORMAL HIGH (ref 20.0–28.0)
Delivery systems: POSITIVE
FIO2: 0.4
Mechanical Rate: 10
O2 Saturation: 26.8 %
PEEP: 5 cmH2O
Patient temperature: 37
Pressure support: 12 cmH2O
pCO2, Ven: 91 mmHg (ref 44.0–60.0)
pH, Ven: 7.2 — ABNORMAL LOW (ref 7.250–7.430)
pO2, Ven: <31 mmHg — CL (ref 32.0–45.0)

## 2020-05-26 LAB — COMPREHENSIVE METABOLIC PANEL
ALT: 32 U/L (ref 0–44)
AST: 42 U/L — ABNORMAL HIGH (ref 15–41)
Albumin: 5.5 g/dL — ABNORMAL HIGH (ref 3.5–5.0)
Alkaline Phosphatase: 78 U/L (ref 38–126)
Anion gap: 15 (ref 5–15)
BUN: 27 mg/dL — ABNORMAL HIGH (ref 8–23)
CO2: 31 mmol/L (ref 22–32)
Calcium: 9.9 mg/dL (ref 8.9–10.3)
Chloride: 75 mmol/L — ABNORMAL LOW (ref 98–111)
Creatinine, Ser: 0.71 mg/dL (ref 0.44–1.00)
GFR calc Af Amer: >60 mL/min (ref >60)
GFR calc non Af Amer: >60 mL/min (ref >60)
Glucose, Bld: 130 mg/dL — ABNORMAL HIGH (ref 70–99)
Potassium: 4.5 mmol/L (ref 3.5–5.1)
Sodium: 121 mmol/L — ABNORMAL LOW (ref 135–145)
Total Bilirubin: 1.1 mg/dL (ref 0.3–1.2)
Total Protein: 8.5 g/dL — ABNORMAL HIGH (ref 6.5–8.1)

## 2020-05-26 LAB — CBC WITH DIFFERENTIAL/PLATELET
Abs Immature Granulocytes: 0.19 10*3/uL — ABNORMAL HIGH (ref 0.00–0.07)
Basophils Absolute: 0.1 10*3/uL (ref 0.0–0.1)
Basophils Relative: 0 %
Eosinophils Absolute: 0.5 10*3/uL (ref 0.0–0.5)
Eosinophils Relative: 3 %
HCT: 45.2 % (ref 36.0–46.0)
Hemoglobin: 15.9 g/dL — ABNORMAL HIGH (ref 12.0–15.0)
Immature Granulocytes: 1 %
Lymphocytes Relative: 3 %
Lymphs Abs: 0.4 10*3/uL — ABNORMAL LOW (ref 0.7–4.0)
MCH: 31.4 pg (ref 26.0–34.0)
MCHC: 35.2 g/dL (ref 30.0–36.0)
MCV: 89.2 fL (ref 80.0–100.0)
Monocytes Absolute: 0.7 10*3/uL (ref 0.1–1.0)
Monocytes Relative: 4 %
Neutro Abs: 14.3 10*3/uL — ABNORMAL HIGH (ref 1.7–7.7)
Neutrophils Relative %: 89 %
Platelets: 349 10*3/uL (ref 150–400)
RBC: 5.07 MIL/uL (ref 3.87–5.11)
RDW: 12.1 % (ref 11.5–15.5)
WBC: 16.1 10*3/uL — ABNORMAL HIGH (ref 4.0–10.5)
nRBC: 0 % (ref 0.0–0.2)

## 2020-05-26 LAB — ETHANOL: Alcohol, Ethyl (B): <10 mg/dL (ref <10)

## 2020-05-26 LAB — SARS CORONAVIRUS 2 BY RT PCR (HOSPITAL ORDER, PERFORMED IN ~~LOC~~ HOSPITAL LAB): SARS Coronavirus 2: NEGATIVE

## 2020-05-26 LAB — LACTIC ACID, PLASMA: Lactic Acid, Venous: 1.4 mmol/L (ref 0.5–1.9)

## 2020-05-26 LAB — BRAIN NATRIURETIC PEPTIDE: B Natriuretic Peptide: 596.5 pg/mL — ABNORMAL HIGH (ref 0.0–100.0)

## 2020-05-26 LAB — GLUCOSE, CAPILLARY: Glucose-Capillary: 114 mg/dL — ABNORMAL HIGH (ref 70–99)

## 2020-05-26 MED ORDER — IPRATROPIUM-ALBUTEROL 0.5-2.5 (3) MG/3ML IN SOLN
3.0000 mL | Freq: Once | RESPIRATORY_TRACT | Status: AC
  Administered 2020-05-26: 3 mL via RESPIRATORY_TRACT
  Filled 2020-05-26: qty 3

## 2020-05-26 MED ORDER — SODIUM CHLORIDE 0.9 % IV BOLUS
500.0000 mL | Freq: Once | INTRAVENOUS | Status: AC
  Administered 2020-05-26: 500 mL via INTRAVENOUS

## 2020-05-26 MED ORDER — BISACODYL 5 MG PO TBEC: 10.0000 mg | DELAYED_RELEASE_TABLET | Freq: Every day | ORAL | Status: DC | PRN

## 2020-05-26 MED ORDER — LORAZEPAM 2 MG/ML IJ SOLN
1.0000 mg | INTRAMUSCULAR | Status: DC | PRN
  Administered 2020-05-27: 1 mg via INTRAVENOUS
  Filled 2020-05-26: qty 1

## 2020-05-26 MED ORDER — INSULIN ASPART 100 UNIT/ML ~~LOC~~ SOLN
0.0000 [IU] | Freq: Three times a day (TID) | SUBCUTANEOUS | Status: DC
  Administered 2020-05-27 – 2020-05-30 (×6): 1 [IU] via SUBCUTANEOUS
  Administered 2020-05-31: 2 [IU] via SUBCUTANEOUS
  Filled 2020-05-26 (×7): qty 1

## 2020-05-26 MED ORDER — METHYLPREDNISOLONE SODIUM SUCC 40 MG IJ SOLR
40.0000 mg | Freq: Three times a day (TID) | INTRAMUSCULAR | Status: DC
  Administered 2020-05-26 – 2020-06-01 (×16): 40 mg via INTRAVENOUS
  Filled 2020-05-26 (×17): qty 1

## 2020-05-26 MED ORDER — SODIUM CHLORIDE 0.9% FLUSH
3.0000 mL | Freq: Two times a day (BID) | INTRAVENOUS | Status: DC
  Administered 2020-05-26 – 2020-05-29 (×4): 3 mL via INTRAVENOUS

## 2020-05-26 MED ORDER — ENOXAPARIN SODIUM 30 MG/0.3ML ~~LOC~~ SOLN
30.0000 mg | SUBCUTANEOUS | Status: DC
  Administered 2020-05-26 – 2020-05-31 (×6): 30 mg via SUBCUTANEOUS
  Filled 2020-05-26 (×8): qty 0.3

## 2020-05-26 MED ORDER — MORPHINE SULFATE (PF) 2 MG/ML IV SOLN
1.0000 mg | INTRAVENOUS | Status: DC | PRN
  Administered 2020-05-30 – 2020-05-31 (×3): 1 mg via INTRAVENOUS
  Filled 2020-05-26 (×4): qty 1

## 2020-05-26 MED ORDER — TRAMADOL HCL 50 MG PO TABS: 50.0000 mg | ORAL_TABLET | Freq: Four times a day (QID) | ORAL | Status: DC | PRN

## 2020-05-26 MED ORDER — IPRATROPIUM-ALBUTEROL 0.5-2.5 (3) MG/3ML IN SOLN
3.0000 mL | Freq: Four times a day (QID) | RESPIRATORY_TRACT | Status: DC
  Administered 2020-05-26 – 2020-05-27 (×2): 3 mL via RESPIRATORY_TRACT
  Filled 2020-05-26 (×2): qty 3

## 2020-05-26 MED ORDER — DOCUSATE SODIUM 100 MG PO CAPS: 200.0000 mg | ORAL_CAPSULE | Freq: Two times a day (BID) | ORAL | Status: DC | PRN

## 2020-05-26 MED ORDER — LORATADINE 10 MG PO TABS
10.0000 mg | ORAL_TABLET | Freq: Every day | ORAL | Status: DC
  Administered 2020-05-26 – 2020-05-29 (×3): 10 mg via ORAL
  Filled 2020-05-26 (×5): qty 1

## 2020-05-26 MED ORDER — ONDANSETRON HCL 4 MG/2ML IJ SOLN: 4.0000 mg | Freq: Three times a day (TID) | INTRAMUSCULAR | Status: DC | PRN

## 2020-05-26 MED ORDER — FLUTICASONE FUROATE-VILANTEROL 200-25 MCG/INH IN AEPB
1.0000 | INHALATION_SPRAY | Freq: Every day | RESPIRATORY_TRACT | Status: DC
  Administered 2020-05-27 – 2020-05-29 (×2): 1 via RESPIRATORY_TRACT
  Filled 2020-05-26: qty 28

## 2020-05-26 MED ORDER — NICOTINE 14 MG/24HR TD PT24
14.0000 mg | MEDICATED_PATCH | Freq: Every day | TRANSDERMAL | Status: DC
  Administered 2020-05-26 – 2020-05-31 (×4): 14 mg via TRANSDERMAL
  Filled 2020-05-26 (×7): qty 1

## 2020-05-26 MED ORDER — SODIUM CHLORIDE 0.9 % IV SOLN: Freq: Once | INTRAVENOUS | Status: AC

## 2020-05-26 MED ORDER — METHYLPREDNISOLONE SODIUM SUCC 125 MG IJ SOLR
125.0000 mg | Freq: Once | INTRAMUSCULAR | Status: AC
  Administered 2020-05-26: 125 mg via INTRAVENOUS
  Filled 2020-05-26: qty 2

## 2020-05-26 MED ORDER — SODIUM CHLORIDE 0.9 % IV SOLN
500.0000 mg | INTRAVENOUS | Status: DC
  Administered 2020-05-26 – 2020-05-31 (×6): 500 mg via INTRAVENOUS
  Filled 2020-05-26 (×8): qty 500

## 2020-05-26 NOTE — H&P (Signed)
History and Physical    Lindsey Nguyen KGU:[id number] DOB: November 20, 1950 DOA: 05/25/2020  PCP: Juline Patch, MD Patient coming from: home    Chief Complaint: shortness of breath   HPI: 69 y/o F w/ PMH of alcohol abuse, HTN, DM2, COPD on 2L [place], smoker who presents w/ shortness of breath x 3 weeks. The shortness of breath is at rest as well as with exertion. The shortness of breath has progressively become worse over the past few days. Of note, pt was suppose to be on 2L [place] but does not use oxygen at home. Also, pt has chronic cough x several years. Pt denies any fever, chills, sweating, chest pain, nausea, vomiting, abd pain, dysuria, urinary urgency, urinary frequency, diarrhea, or constipation.   Review of Systems: As per HPI otherwise 10 point review of systems negative.    Past Medical History:  Diagnosis Date  . Cancer [facility])    right breast cancer with lumpectomy and rad tx  . COPD (chronic obstructive pulmonary disease) ([place])   . Diabetes mellitus without complication ([place])   . Hypertension   . Osteoporosis     Past Surgical History:  Procedure Laterality Date  . BREAST LUMPECTOMY     x 2  . BUNIONECTOMY Right   . COLONOSCOPY     normal  . PERIPHERAL VASCULAR CATHETERIZATION N/A 04/10/2016   Procedure: Lower Extremity Angiography;  Surgeon: Katha Cabal, MD;  Location: [place] [facility];  Service: Cardiovascular;  Laterality: N/A;  . PERIPHERAL VASCULAR CATHETERIZATION  04/10/2016   Procedure: Lower Extremity Intervention;  Surgeon: Katha Cabal, MD;  Location: [place] [facility];  Service: Cardiovascular;;  . TUBAL LIGATION       reports that she has been smoking cigarettes. She has a 37.50 pack-year smoking history. She has never used smokeless tobacco. She reports current alcohol use of about 21.0 standard drinks of alcohol per week. She reports that she does not use drugs.  Allergies  Allergen Reactions  . Tetanus Toxoids Swelling    Family History   Problem Relation Age of Onset  . Diabetes Father   . Kidney cancer Father   . Cancer Brother   . Diabetes Paternal Aunt   . Diabetes Paternal Uncle   . Kidney failure Mother    Prior to Admission medications   Medication Sig Start Date End Date Taking? Authorizing Provider  albuterol (VENTOLIN HFA) 108 (90 Base) MCG/ACT inhaler TAKE 2 PUFFS BY MOUTH EVERY 6 HOURS AS NEEDED FOR WHEEZE OR SHORTNESS OF BREATH 05/20/20   Juline Patch, MD  fluticasone furoate-vilanterol (BREO ELLIPTA) 200-25 MCG/INH AEPB Inhale 1 puff into the lungs daily. 05/14/20   Fritzi Mandes, MD  ipratropium-albuterol (DUONEB) 0.5-2.5 (3) MG/3ML SOLN Take 3 mLs by nebulization every 6 (six) hours as needed. 05/13/20   Fritzi Mandes, MD  lisinopril (ZESTRIL) 10 MG tablet Take 1 tablet (10 mg total) by mouth 2 (two) times daily before a meal. 05/20/20   Juline Patch, MD  loratadine (CLARITIN) 10 MG tablet Take 1 tablet (10 mg total) by mouth daily. 05/20/20   Juline Patch, MD  metFORMIN (GLUCOPHAGE) 500 MG tablet Take 1 tablet (500 mg total) by mouth daily. 05/20/20   Juline Patch, MD  Multiple Vitamin (MULTIVITAMIN WITH MINERALS) TABS tablet Take 1 tablet by mouth daily. 05/14/20   Fritzi Mandes, MD  predniSONE (DELTASONE) 10 MG tablet Take 50 mg daily taper by 10 mg daily and thereafter 20 mg qd 05/14/20  Fritzi Mandes, MD  tiotropium (SPIRIVA) 18 MCG inhalation capsule Place 1 capsule (18 mcg total) into inhaler and inhale daily. 05/14/20   Fritzi Mandes, MD    Physical Exam: Vitals:   05/09/2020 1242 05/30/2020 1243 05/02/2020 1351 05/25/2020 1503  BP: 123/85  124/65   Pulse: 100  99   Resp: (!) 30  (!) 38   Temp:    (!) 96.1 F (35.6 C)  TempSrc:    Rectal  SpO2: (!) 88%  (!) 89%   Weight:  30.8 kg    Height:  5\' 3"  (1.6 m)      Constitutional: Appears lethargic, cachetic  Vitals:   05/04/2020 1242 05/03/2020 1243 05/29/2020 1351 05/14/2020 1503  BP: 123/85  124/65   Pulse: 100  99   Resp: (!) 30  (!) 38   Temp:    (!) 96.1  F (35.6 C)  TempSrc:    Rectal  SpO2: (!) 88%  (!) 89%   Weight:  30.8 kg    Height:  5\' 3"  (1.6 m)     Eyes: PERRL, lids and conjunctivae normal ENMT: Mucous membranes are moist.  Neck: normal, supple Respiratory: diminished breath sounds b/l. No rales  Cardiovascular: S1/S2+, no rubs / gallops. 2+ b/l LE pitting edema  Abdomen: soft, no tenderness, non distended, hypoactive bowel sounds  Musculoskeletal: no clubbing / cyanosis. No joint deformity upper and lower extremities. Good ROM, no contractures. Normal muscle tone.  Skin: no rashes, lesions, ulcers. No induration Neurologic: CN 2-12 grossly intact. Decreased strength of b/l LE.  Psychiatric: Normal judgment and insight. Lethargic. Flat mood and affect     Labs on Admission: I have personally reviewed following labs and imaging studies  CBC: Recent Labs  Lab 05/30/2020 1252  WBC 16.1*  NEUTROABS 14.3*  HGB 15.9*  HCT 45.2  MCV 89.2  PLT 341   Basic Metabolic Panel: Recent Labs  Lab 05/08/2020 1252  NA 121*  K 4.5  CL 75*  CO2 31  GLUCOSE 130*  BUN 27*  CREATININE 0.71  CALCIUM 9.9   GFR: Estimated Creatinine Clearance: 32.7 mL/min (by C-G formula based on SCr of 0.71 mg/dL). Liver Function Tests: Recent Labs  Lab 05/20/2020 1252  AST 42*  ALT 32  ALKPHOS 78  BILITOT 1.1  PROT 8.5*  ALBUMIN 5.5*   No results for input(s): LIPASE, AMYLASE in the last 168 hours. No results for input(s): AMMONIA in the last 168 hours. Coagulation Profile: No results for input(s): INR, PROTIME in the last 168 hours. Cardiac Enzymes: No results for input(s): CKTOTAL, CKMB, CKMBINDEX, TROPONINI in the last 168 hours. BNP (last 3 results) No results for input(s): PROBNP in the last 8760 hours. HbA1C: No results for input(s): HGBA1C in the last 72 hours. CBG: No results for input(s): GLUCAP in the last 168 hours. Lipid Profile: No results for input(s): CHOL, HDL, LDLCALC, TRIG, CHOLHDL, LDLDIRECT in the last 72  hours. Thyroid Function Tests: No results for input(s): TSH, T4TOTAL, FREET4, T3FREE, THYROIDAB in the last 72 hours. Anemia Panel: No results for input(s): VITAMINB12, FOLATE, FERRITIN, TIBC, IRON, RETICCTPCT in the last 72 hours. Urine analysis:    Component Value Date/Time   BILIRUBINUR neg 01/07/2017 1056   PROTEINUR trace 01/07/2017 1056   UROBILINOGEN 0.2 01/07/2017 1056   NITRITE pos 01/07/2017 1056   LEUKOCYTESUR large (3+) (A) 01/07/2017 1056    Radiological Exams on Admission: DG Chest Portable 1 View  Result Date: 05/25/2020 CLINICAL DATA:  Shortness of breath,  COPD EXAM: PORTABLE CHEST 1 VIEW COMPARISON:  05/10/2020 FINDINGS: The heart size and mediastinal contours are within normal limits. Atherosclerotic calcification of the aortic knob. Markedly hyperexpanded lungs with chronically coarsened interstitial markings. No focal airspace consolidation, pleural effusion, or pneumothorax. The visualized skeletal structures are unremarkable. IMPRESSION: Advanced COPD without superimposed acute process. Electronically Signed   By: Davina Poke D.O.   On: 05/25/2020 13:18    EKG: Independently reviewed.   Assessment/Plan Active Problems:   * No active hospital problems. *  COPD exacerbation: start on IV azithromycin, steroids & bronchodilators. Encourage incentive spirometry. Continue on BiPAP and wean back to baseline supplemental oxygen which is 2L [place]  Acute on chronic hypoxic respiratory failure: secondary to above. Management as stated above  Hyponatremia: likely secondary to alcohol abuse. Continue on IVFs. Repeat sodium level Q6H. Correct slowly to avoid central pontine myelinolysis  Alcohol abuse: drinks  1-1/2 bottle of wine daily. Last drink day prior to admission. Ethanol level & urine drug screen ordered. Ativan prn for w/drawals  Transaminitis: likely secondary to alcohol abuse. Will continue to monitor   Leukocytosis: likely secondary to steroid use. Will  continue to monitor  DM2: will hold home dose of metformin. Will start SSI w/ accuchecks  HTN: will hold home dose of lisinopril secondary to low normal BP  Malnutrition: likely severe protein. Nutrition consulted   Smoker: smoking cessation counseling. Nicotine patch to prevent w/drawal   DVT prophylaxis: lovenox Code Status: DNR Family Communication:  Disposition Plan:  Depends on PT/OT recs (not consulted yet)  Consults called: Admission status: inpatient    Wyvonnia Dusky MD [facility] Pager [phone]  If 7PM-7AM, please contact night-coverage [email or web address]   05/14/2020, 3:32 PM

## 2020-05-26 NOTE — Telephone Encounter (Signed)
Spoke to CNA- told her to call ambulance to have pt transported to hospital due to "not being able to swallow anything, can't go to bathroom, is not doing anything but laying down"

## 2020-05-26 NOTE — ED Notes (Signed)
respitory at bedside for bipap.

## 2020-05-26 NOTE — Telephone Encounter (Signed)
Spoke to CNA- go to ER now

## 2020-05-26 NOTE — ED Triage Notes (Signed)
Pt presents to ED from home where she lives alone. Pt was referred to ED by PCP per EMS "to receive fluids". Pt c/o SOB, pt has hx of COPD and is current everyday smoker. Pt had BP of 185/90, HR 120, and 83% of RA.

## 2020-05-26 NOTE — ED Provider Notes (Signed)
[facility] Emergency Department Provider Note    First MD Initiated Contact with Patient 05/08/2020 1227     (approximate)  I have reviewed the triage vital signs and the nursing notes.   HISTORY  Chief Complaint Shortness of Breath    HPI Lindsey Nguyen is a 69 y.o. female with the below listed past medical history presents to the ER for worsening shortness of breath.  Per EMS port was sent by PCP due to needing "more fluids".  However there is no clear documentation that this is the case or particular what they were referring to.  Patient does state that she feels unwell feels very short short of breath.  Found to be hypoxic with minimal air movement.  Placed on BiPAP for respiratory support    Past Medical History:  Diagnosis Date  . Cancer [facility])    right breast cancer with lumpectomy and rad tx  . COPD (chronic obstructive pulmonary disease) ([place])   . Diabetes mellitus without complication ([place])   . Hypertension   . Osteoporosis    Family History  Problem Relation Age of Onset  . Diabetes Father   . Kidney cancer Father   . Cancer Brother   . Diabetes Paternal Aunt   . Diabetes Paternal Uncle   . Kidney failure Mother    Past Surgical History:  Procedure Laterality Date  . BREAST LUMPECTOMY     x 2  . BUNIONECTOMY Right   . COLONOSCOPY     normal  . PERIPHERAL VASCULAR CATHETERIZATION N/A 04/10/2016   Procedure: Lower Extremity Angiography;  Surgeon: Katha Cabal, MD;  Location: [place] CV [facility];  Service: Cardiovascular;  Laterality: N/A;  . PERIPHERAL VASCULAR CATHETERIZATION  04/10/2016   Procedure: Lower Extremity Intervention;  Surgeon: Katha Cabal, MD;  Location: [place] [facility];  Service: Cardiovascular;;  . TUBAL LIGATION     Patient Active Problem List   Diagnosis Date Noted  . COPD exacerbation ([place]) 05/12/2020  . Tobacco abuse 05/11/2020  . Alcohol use 05/11/2020  . Hyponatremia 05/11/2020  .  Protein-calorie malnutrition, severe 07/29/2019  . COPD with acute exacerbation ([place]) 07/27/2019  . Acute pansinusitis 04/02/2018  . Chronic bronchitis with acute exacerbation ([place]) 04/02/2018  . Acute on chronic respiratory failure with hypoxia ([place]) 10/28/2017  . Diabetes mellitus without complication ([place]) 70/17/7939  . Aortic atherosclerosis ([place]) 05/08/2016  . Allergic rhinitis due to pollen 05/08/2016  . Excessive weight loss 02/28/2016  . Essential hypertension 05/19/2015  . Prediabetes 05/19/2015  . Osteoporosis 05/19/2015  . COLD (chronic obstructive lung disease) ([place]) 05/19/2015      Prior to Admission medications   Medication Sig Start Date End Date Taking? Authorizing Provider  albuterol (VENTOLIN HFA) 108 (90 Base) MCG/ACT inhaler TAKE 2 PUFFS BY MOUTH EVERY 6 HOURS AS NEEDED FOR WHEEZE OR SHORTNESS OF BREATH 05/20/20   Juline Patch, MD  fluticasone furoate-vilanterol (BREO ELLIPTA) 200-25 MCG/INH AEPB Inhale 1 puff into the lungs daily. 05/14/20   Fritzi Mandes, MD  ipratropium-albuterol (DUONEB) 0.5-2.5 (3) MG/3ML SOLN Take 3 mLs by nebulization every 6 (six) hours as needed. 05/13/20   Fritzi Mandes, MD  lisinopril (ZESTRIL) 10 MG tablet Take 1 tablet (10 mg total) by mouth 2 (two) times daily before a meal. 05/20/20   Juline Patch, MD  loratadine (CLARITIN) 10 MG tablet Take 1 tablet (10 mg total) by mouth daily. 05/20/20   Juline Patch, MD  metFORMIN (GLUCOPHAGE) 500 MG tablet  Take 1 tablet (500 mg total) by mouth daily. 05/20/20   Juline Patch, MD  Multiple Vitamin (MULTIVITAMIN WITH MINERALS) TABS tablet Take 1 tablet by mouth daily. 05/14/20   Fritzi Mandes, MD  predniSONE (DELTASONE) 10 MG tablet Take 50 mg daily taper by 10 mg daily and thereafter 20 mg qd 05/14/20   Fritzi Mandes, MD  tiotropium (SPIRIVA) 18 MCG inhalation capsule Place 1 capsule (18 mcg total) into inhaler and inhale daily. 05/14/20   Fritzi Mandes, MD    Allergies Tetanus toxoids    Social  History Social History   Tobacco Use  . Smoking status: Current Every Day Smoker    Packs/day: 0.75    Years: 50.00    Pack years: 37.50    Types: Cigarettes  . Smokeless tobacco: Never Used  Substance Use Topics  . Alcohol use: Yes    Alcohol/week: 21.0 standard drinks    Types: 14 Glasses of wine, 7 Cans of beer per week  . Drug use: No    Review of Systems Patient denies headaches, rhinorrhea, blurry vision, numbness, shortness of breath, chest pain, edema, cough, abdominal pain, nausea, vomiting, diarrhea, dysuria, fevers, rashes or hallucinations unless otherwise stated above in HPI. ____________________________________________   PHYSICAL EXAM:  VITAL SIGNS: Vitals:   05/22/2020 1351 05/11/2020 1503  BP: 124/65   Pulse: 99   Resp: (!) 38   Temp:  (!) 96.1 F (35.6 C)  SpO2: (!) 89%     Constitutional: Alert and oriented.  Eyes: Conjunctivae are normal.  Head: Atraumatic. Nose: No congestion/rhinnorhea. Mouth/Throat: Mucous membranes are moist.   Neck: No stridor. Painless ROM.  Cardiovascular: Normal rate, regular rhythm. Grossly normal heart sounds.  Good peripheral circulation. Respiratory: tachypnea with prolonged exp phase,  Diminished LS throughout Gastrointestinal: Soft and nontender. No distention. No abdominal bruits. No CVA tenderness. Genitourinary: deferred Musculoskeletal: No lower extremity tenderness nor edema.  No joint effusions. Neurologic:  Normal speech and language. No gross focal neurologic deficits are appreciated. No facial droop Skin:  Skin is warm, dry and intact. No rash noted. Psychiatric: Mood and affect are normal. Speech and behavior are normal.  ____________________________________________   LABS (all labs ordered are listed, but only abnormal results are displayed)  Results for orders placed or performed during the hospital encounter of 05/08/2020 (from the past 24 hour(s))  CBC with Differential/Platelet     Status: Abnormal    Collection Time: 05/05/2020 12:52 PM  Result Value Ref Range   WBC 16.1 (H) 4.0 - 10.5 K/uL   RBC 5.07 3.87 - 5.11 MIL/uL   Hemoglobin 15.9 (H) 12.0 - 15.0 g/dL   HCT 45.2 36 - 46 %   MCV 89.2 80.0 - 100.0 fL   MCH 31.4 26.0 - 34.0 pg   MCHC 35.2 30.0 - 36.0 g/dL   RDW 12.1 11.5 - 15.5 %   Platelets 349 150 - 400 K/uL   nRBC 0.0 0.0 - 0.2 %   Neutrophils Relative % 89 %   Neutro Abs 14.3 (H) 1.7 - 7.7 K/uL   Lymphocytes Relative 3 %   Lymphs Abs 0.4 (L) 0.7 - 4.0 K/uL   Monocytes Relative 4 %   Monocytes Absolute 0.7 0 - 1 K/uL   Eosinophils Relative 3 %   Eosinophils Absolute 0.5 0 - 0 K/uL   Basophils Relative 0 %   Basophils Absolute 0.1 0 - 0 K/uL   Immature Granulocytes 1 %   Abs Immature Granulocytes 0.19 (H) 0.00 -  0.07 K/uL  Comprehensive metabolic panel     Status: Abnormal   Collection Time: 05/29/2020 12:52 PM  Result Value Ref Range   Sodium 121 (L) 135 - 145 mmol/L   Potassium 4.5 3.5 - 5.1 mmol/L   Chloride 75 (L) 98 - 111 mmol/L   CO2 31 22 - 32 mmol/L   Glucose, Bld 130 (H) 70 - 99 mg/dL   BUN 27 (H) 8 - 23 mg/dL   Creatinine, Ser 0.71 0.44 - 1.00 mg/dL   Calcium 9.9 8.9 - 10.3 mg/dL   Total Protein 8.5 (H) 6.5 - 8.1 g/dL   Albumin 5.5 (H) 3.5 - 5.0 g/dL   AST 42 (H) 15 - 41 U/L   ALT 32 0 - 44 U/L   Alkaline Phosphatase 78 38 - 126 U/L   Total Bilirubin 1.1 0.3 - 1.2 mg/dL   GFR calc non Af Amer >60 >60 mL/min   GFR calc Af Amer >60 >60 mL/min   Anion gap 15 5 - 15  Brain natriuretic peptide     Status: Abnormal   Collection Time: 05/06/2020 12:52 PM  Result Value Ref Range   B Natriuretic Peptide 596.5 (H) 0.0 - 100.0 pg/mL  Lactic acid, plasma     Status: None   Collection Time: 05/09/2020 12:53 PM  Result Value Ref Range   Lactic Acid, Venous 1.4 0.5 - 1.9 mmol/L   ____________________________________________  EKG My review and personal interpretation at Time: 15:15   Indication: sob  Rate: 100  Rhythm: sinus Axis: normal Other: normal intervals,  no stemi ____________________________________________  RADIOLOGY  Mix one tablespoon with 8oz of your favorite juice or water every day until you are having soft formed stools. Then start taking once daily if you didn't have a stool the day before.  ____________________________________________   PROCEDURES  Procedure(s) performed:  .Critical Care Performed by: Merlyn Lot, MD Authorized by: Merlyn Lot, MD   Critical care provider statement:    Critical care time (minutes):  35   Critical care time was exclusive of:  Separately billable procedures and treating other patients   Critical care was necessary to treat or prevent imminent or life-threatening deterioration of the following conditions:  Respiratory failure and metabolic crisis   Critical care was time spent personally by me on the following activities:  Development of treatment plan with patient or surrogate, discussions with consultants, evaluation of patient's response to treatment, examination of patient, obtaining history from patient or surrogate, ordering and performing treatments and interventions, ordering and review of laboratory studies, ordering and review of radiographic studies, pulse oximetry, re-evaluation of patient's condition and review of old charts      Critical Care performed: yes ____________________________________________   INITIAL IMPRESSION / [place] / ED COURSE  Pertinent labs & imaging results that were available during my care of the patient were reviewed by me and considered in my medical decision making (see chart for details).   DDX: Asthma, copd, CHF, pna, ptx, malignancy, Pe, anemia   BOBBIE VIRDEN is a 69 y.o. who presents to the ED with presentation as described above.  Patient with mild respiratory distress.  Patient placed on BiPAP for respiratory support due to her history of COPD.  Chest x-ray shows no evidence of pneumothorax or consolidation or edema.   I suspect this is COPD exacerbation.  Also noted to have hyponatremia on blood work will give IV fluids.  Will have ordered duo nebs as well as Solu-Medrol.  Clinical Course as of May 26 1521  Thu May 26, 2020  1256 Patient appears mottled altered critically ill.  Found to be hypoxic.  Have ordered nebs.  Will place on BiPAP.   [PR]  1447 Patient appears much better clinically.  Mentating well.    [PR]    Clinical Course User Index [PR] Merlyn Lot, MD    The patient was evaluated in Emergency Department today for the symptoms described in the history of present illness. He/she was evaluated in the context of the global COVID-19 pandemic, which necessitated consideration that the patient might be at risk for infection with the SARS-CoV-2 virus that causes COVID-19. Institutional protocols and algorithms that pertain to the evaluation of patients at risk for COVID-19 are in a state of rapid change based on information released by regulatory bodies including the CDC and federal and state organizations. These policies and algorithms were followed during the patient's care in the ED.  As part of my medical decision making, I reviewed the following data within the [place] notes reviewed and incorporated, Labs reviewed, notes from prior ED visits and [place] Controlled Substance Database   ____________________________________________   FINAL CLINICAL IMPRESSION(S) / ED DIAGNOSES  Final diagnoses:  COPD exacerbation ([place])  Hyponatremia      NEW MEDICATIONS STARTED DURING THIS VISIT:  New Prescriptions   No medications on file     Note:  This document was prepared using Dragon voice recognition software and may include unintentional dictation errors.    Merlyn Lot, MD 05/07/2020 [id number]

## 2020-05-26 NOTE — Telephone Encounter (Signed)
Copied from [place] [id number]. Topic: General - Inquiry >> May 25, 2020  4:07 PM Alease Frame wrote: Reason for CRM: Manuela Schwartz pts CNA is asking for a call back regarding pt  Call back [phone]

## 2020-05-26 NOTE — ED Notes (Signed)
Updated pt's niece Nira Conn who is in from out of town. Provided pt w/ phone to speak w/ her

## 2020-05-26 NOTE — ED Notes (Signed)
Attempted in/out cath w/o success, pt placed on pure wick.

## 2020-05-26 NOTE — ED Notes (Signed)
Pt alert in bed at this time asked this RN about her niece coming into town.

## 2020-05-26 NOTE — ED Notes (Signed)
Pt requesting break from BiPap. Called respiratory and advised to give break but put on [facility]. Messaged admitting provider and she said ok, but keep O2 sats >87%.

## 2020-05-27 ENCOUNTER — Inpatient Hospital Stay: Payer: Self-pay

## 2020-05-27 DIAGNOSIS — L8990 Pressure ulcer of unspecified site, unspecified stage: Secondary | ICD-10-CM | POA: Insufficient documentation

## 2020-05-27 DIAGNOSIS — J9621 Acute and chronic respiratory failure with hypoxia: Secondary | ICD-10-CM

## 2020-05-27 LAB — COMPREHENSIVE METABOLIC PANEL
ALT: 26 U/L (ref 0–44)
AST: 30 U/L (ref 15–41)
Albumin: 4 g/dL (ref 3.5–5.0)
Alkaline Phosphatase: 61 U/L (ref 38–126)
Anion gap: 12 (ref 5–15)
BUN: 31 mg/dL — ABNORMAL HIGH (ref 8–23)
CO2: 27 mmol/L (ref 22–32)
Calcium: 8.9 mg/dL (ref 8.9–10.3)
Chloride: 87 mmol/L — ABNORMAL LOW (ref 98–111)
Creatinine, Ser: 0.83 mg/dL (ref 0.44–1.00)
GFR calc Af Amer: >60 mL/min (ref >60)
GFR calc non Af Amer: >60 mL/min (ref >60)
Glucose, Bld: 175 mg/dL — ABNORMAL HIGH (ref 70–99)
Potassium: 4.7 mmol/L (ref 3.5–5.1)
Sodium: 126 mmol/L — ABNORMAL LOW (ref 135–145)
Total Bilirubin: 0.9 mg/dL (ref 0.3–1.2)
Total Protein: 6.4 g/dL — ABNORMAL LOW (ref 6.5–8.1)

## 2020-05-27 LAB — SODIUM
Sodium: 129 mmol/L — ABNORMAL LOW (ref 135–145)
Sodium: 130 mmol/L — ABNORMAL LOW (ref 135–145)
Sodium: 134 mmol/L — ABNORMAL LOW (ref 135–145)

## 2020-05-27 LAB — CBC
HCT: 38.6 % (ref 36.0–46.0)
Hemoglobin: 13.1 g/dL (ref 12.0–15.0)
MCH: 31.3 pg (ref 26.0–34.0)
MCHC: 33.9 g/dL (ref 30.0–36.0)
MCV: 92.3 fL (ref 80.0–100.0)
Platelets: 265 10*3/uL (ref 150–400)
RBC: 4.18 MIL/uL (ref 3.87–5.11)
RDW: 12.2 % (ref 11.5–15.5)
WBC: 16.7 10*3/uL — ABNORMAL HIGH (ref 4.0–10.5)
nRBC: 0 % (ref 0.0–0.2)

## 2020-05-27 LAB — GLUCOSE, CAPILLARY
Glucose-Capillary: 144 mg/dL — ABNORMAL HIGH (ref 70–99)
Glucose-Capillary: 153 mg/dL — ABNORMAL HIGH (ref 70–99)
Glucose-Capillary: 131 mg/dL — ABNORMAL HIGH (ref 70–99)
Glucose-Capillary: 142 mg/dL — ABNORMAL HIGH (ref 70–99)

## 2020-05-27 LAB — HIV ANTIBODY (ROUTINE TESTING W REFLEX): HIV Screen 4th Generation wRfx: NONREACTIVE

## 2020-05-27 MED ORDER — ADULT MULTIVITAMIN W/MINERALS CH
1.0000 | ORAL_TABLET | Freq: Every day | ORAL | Status: DC
  Administered 2020-05-29: 1 via ORAL
  Filled 2020-05-27 (×2): qty 1

## 2020-05-27 MED ORDER — LISINOPRIL 20 MG PO TABS
20.0000 mg | ORAL_TABLET | Freq: Every day | ORAL | Status: DC
  Filled 2020-05-27 (×2): qty 1

## 2020-05-27 MED ORDER — IPRATROPIUM-ALBUTEROL 0.5-2.5 (3) MG/3ML IN SOLN
3.0000 mL | Freq: Three times a day (TID) | RESPIRATORY_TRACT | Status: DC
  Administered 2020-05-27 – 2020-06-01 (×15): 3 mL via RESPIRATORY_TRACT
  Filled 2020-05-27 (×15): qty 3

## 2020-05-27 MED ORDER — SODIUM CHLORIDE 0.9% FLUSH
10.0000 mL | Freq: Two times a day (BID) | INTRAVENOUS | Status: DC
  Administered 2020-05-27: 11:00:00 20 mL
  Administered 2020-05-27: 10 mL
  Administered 2020-05-28: 20 mL
  Administered 2020-05-28 – 2020-05-31 (×7): 10 mL
  Administered 2020-06-01: 20 mL

## 2020-05-27 MED ORDER — SODIUM CHLORIDE 0.9% FLUSH: 10.0000 mL | INTRAVENOUS | Status: DC | PRN

## 2020-05-27 MED ORDER — CHLORHEXIDINE GLUCONATE CLOTH 2 % EX PADS
6.0000 | MEDICATED_PAD | Freq: Every day | CUTANEOUS | Status: DC
  Administered 2020-05-27 – 2020-05-31 (×5): 6 via TOPICAL

## 2020-05-27 NOTE — Progress Notes (Signed)
   Patient was admitted last night and she pulled out her IV. The I V team reports that she has poor vasculature and have had 2 prior IV placements and will not be able to place in another. Patient has a restricted arm d/t a lumpectomy and radiation therapy. Patient is currently taking Solumedrol and Azythromycin. IV team is suggesting a central vascular access sort.  On call provider notified

## 2020-05-27 NOTE — Progress Notes (Signed)
Initial Nutrition Assessment  DOCUMENTATION CODES:   Severe malnutrition in context of chronic illness  INTERVENTION:   Magic cup TID with meals, each supplement provides 290 kcal and 9 grams of protein  MVI daily   Pt declines ONS and snacks   NUTRITION DIAGNOSIS:   Severe Malnutrition related to chronic illness (COPD, breast cancer) as evidenced by severe fat depletion, severe muscle depletion.  GOAL:   Patient will meet greater than or equal to 90% of their needs  MONITOR:   PO intake, Supplement acceptance, Labs, Weight trends, Skin, I & O's  REASON FOR ASSESSMENT:   Consult Assessment of nutrition requirement/status  ASSESSMENT:   69 y.o. female with medical history significant of hypertension, diabetes mellitus, COPD, breast cancer (right lumpectomy and radiation therapy), tobacco abuse and alcohol use who presents with shortness breath.  Pt is well known to this RD from multiple previous admits. Pt with poor appetite and oral intake at baseline. Pt generally eats fair while in the hospital and was eating ~80% of her meals during her last admit. Pt does not like nutritional supplements and she declines these during every admit. Pt was offered snacks but she declined this as well. RD will add Magic Cups to meal trays. Per chart, pt is weight stable pta.   Medications reviewed and include: lovenox, insulin, solu-medrol, nicotine, azithromycin   Labs reviewed: Na 126(L), BUN 31(H) Wbc- 16.7(H) cbgs- 114, 144 x 24 hrs  AIC 5.9(H)- 12/20  NUTRITION - FOCUSED PHYSICAL EXAM:    Most Recent Value  Orbital Region Severe depletion  Upper Arm Region Severe depletion  Thoracic and Lumbar Region Severe depletion  Buccal Region Severe depletion  Temple Region Severe depletion  Clavicle Bone Region Severe depletion  Clavicle and Acromion Bone Region Severe depletion  Scapular Bone Region Severe depletion  Dorsal Hand Severe depletion  Patellar Region Severe depletion   Anterior Thigh Region Severe depletion  Posterior Calf Region Severe depletion  Edema (RD Assessment) Mild  Hair Reviewed  Eyes Reviewed  Mouth Reviewed  Skin Reviewed  Nails Reviewed     Diet Order:   Diet Order            Diet Carb Modified Fluid consistency: Thin; Room service appropriate? Yes  Diet effective now                EDUCATION NEEDS:   Education needs have been addressed  Skin:  Skin Assessment: Reviewed RN Assessment (Stage I sacrum)  Last BM:  pta  Height:   Ht Readings from Last 1 Encounters:  05/19/2020 5\' 3"  (1.6 m)    Weight:   Wt Readings from Last 1 Encounters:  05/25/2020 30.8 kg    Ideal Body Weight:  52.3 kg  BMI:  Body mass index is 12.05 kg/m.  Estimated Nutritional Needs:   Kcal:  1200-1400kcal/day  Protein:  50-60g/day  Fluid:  800-959ml/day  Koleen Distance MS, RD, LDN Please refer to [facility] for RD and/or RD on-call/weekend/after hours pager

## 2020-05-27 NOTE — Progress Notes (Signed)
Pt unavailable for 1000 VS r/t MEWS. Pt was in sterile procedure receiving a PICC line at that time.

## 2020-05-27 NOTE — Progress Notes (Signed)
°   05/27/20 0758  Assess: MEWS Score  Temp 97.7 F (36.5 C)  BP (!) 175/62  Pulse Rate (!) 109  Resp 19  Level of Consciousness Responds to Voice  SpO2 97 %  O2 Device Nasal Cannula  O2 Flow Rate (L/min) 5 L/min  Assess: MEWS Score  MEWS Temp 0  MEWS Systolic 0  MEWS Pulse 1  MEWS RR 0  MEWS LOC 1  MEWS Score 2  MEWS Score Color Yellow  Assess: if the MEWS score is Yellow or Red  Were vital signs taken at a resting state? No  Focused Assessment Change from prior assessment (see assessment flowsheet)  Early Detection of Sepsis Score *See Row Information* High  MEWS guidelines implemented *See Row Information* Yes  Treat  Pain Scale PAINAD  Breathing 0  Negative Vocalization 0  Facial Expression 0  Body Language 1  Consolability 1  PAINAD Score 2  Take Vital Signs  Increase Vital Sign Frequency  Yellow: Q 2hr X 2 then Q 4hr X 2, if remains yellow, continue Q 4hrs  Escalate  MEWS: Escalate Yellow: discuss with charge nurse/RN and consider discussing with provider and RRT

## 2020-05-27 NOTE — Progress Notes (Addendum)
IV TEAM: Was consulted by Primary RN for the 2nd time for PIV placement due to recent PIV placed by this VAST RN was pulled out by patient. Prior PIV placed less than 24 hours was also inserted by an IV nurse. Upon assessment, patient has very tiny, poor vasculature with noted right arm restriction related to history of breast lumpectomy. No other appropriate veins for PIV placement deemed appropriate even when midline is to be considered. Advised Primary RN to communicate this with the treatment team.

## 2020-05-27 NOTE — Progress Notes (Signed)
Peripherally Inserted Central Catheter Placement  The IV Nurse has discussed with the patient and/or persons authorized to consent for the patient, the purpose of this procedure and the potential benefits and risks involved with this procedure.  The benefits include less needle sticks, lab draws from the catheter, and the patient may be discharged home with the catheter. Risks include, but not limited to, infection, bleeding, blood clot (thrombus formation), and puncture of an artery; nerve damage and irregular heartbeat and possibility to perform a PICC exchange if needed/ordered by physician.  Alternatives to this procedure were also discussed.  Bard Power PICC patient education guide, fact sheet on infection prevention and patient information card has been provided to patient /or left at bedside.    PICC Placement Documentation  PICC Double Lumen 05/27/20 PICC Left Brachial 35 cm 0 cm (Active)  Exposed Catheter (cm) 0 cm 05/27/20 1014  Site Assessment Clean;Dry;Intact 05/27/20 1014  Lumen #1 Status Blood return noted;Saline locked;Flushed 05/27/20 1014  Lumen #2 Status Blood return noted;Flushed;Saline locked 05/27/20 1014  Dressing Type Transparent;Securing device 05/27/20 1014  Dressing Status Clean;Dry;Intact;Antimicrobial disc in place 05/27/20 1014  Dressing Change Due 06/03/20 05/27/20 1014       Lindsey Nguyen 05/27/2020, 10:17 AM

## 2020-05-27 NOTE — Progress Notes (Signed)
PROGRESS NOTE    Lindsey Nguyen  ZOX:[id number] DOB: June 13, 1951 DOA: 05/28/2020 PCP: Juline Patch, MD   Assessment & Plan:   Active Problems:   COPD exacerbation (HCC)  COPD exacerbation: continue on IV azithromycin, steroids & bronchodilators. Encourage incentive spirometry. Weaned off of BiPAP. Currently on 5L [place] and wean back to baseline supplemental oxygen which is 2L [place]  Acute on chronic hypoxic respiratory failure: secondary to above. Management as stated above  Hyponatremia: likely secondary to alcohol abuse. Continue on IVFs. Repeat sodium level Q6H. Correct slowly to avoid central pontine myelinolysis. Trending up slowly   Alcohol abuse: drinks  1-1/2 bottle of wine daily. Last drink day prior to admission. Ethanol level & urine drug screen ordered. Ativan prn for w/drawals  Transaminitis: resolved  Leukocytosis: likely secondary to steroid use. Will continue to monitor  DM2: will hold home dose of metformin. Will continue on SSI w/ accuchecks  HTN: will restart home dose of lisinopril  Malnutrition: likely severe protein. Will continue on magic cups   Smoker: smoking cessation counseling. Nicotine patch to prevent w/drawal     DVT prophylaxis: lovenox Code Status: DNR Family Communication: discussed pt's care w/ pt's niece, Nira Conn, and answered her questions  Disposition Plan: depends on PT/OT recs   Consultants:      Procedures:    Antimicrobials: azithromycin   Subjective: Pt c/o shortness of breath.   Objective: Vitals:   05/04/2020 2059 05/12/2020 2130 05/28/2020 2228 05/27/20 0343  BP: 130/74 123/79 118/65 (!) 158/65  Pulse: 98 98 90 96  Resp: (!) 21 (!) 25 20 20   Temp: 98.1 F (36.7 C)  98.1 F (36.7 C) 98.1 F (36.7 C)  TempSrc: Oral  Oral Oral  SpO2: 90% 95% 100% 92%  Weight:      Height:        Intake/Output Summary (Last 24 hours) at 05/27/2020 0718 Last data filed at 05/04/2020 1550 Gross per 24 hour  Intake 500 ml    Output --  Net 500 ml   Filed Weights   05/05/2020 1243  Weight: 30.8 kg    Examination:  General exam: Appears calm but uncomfortable. Cachetic  Respiratory system: diminished breath sounds b/l. Unable to talk in complete sentences  Cardiovascular system: S1 & S2 +. No  rubs, gallops or clicks.  Gastrointestinal system: Abdomen is nondistended, soft and nontender.  Normal bowel sounds heard. Central nervous system: Alert and oriented. Moves all 4 extremities. Psychiatry: Judgement and insight appear normal. Flat mood and affect.     Data Reviewed: I have personally reviewed following labs and imaging studies  CBC: Recent Labs  Lab 05/01/2020 1252 05/27/20 0515  WBC 16.1* 16.7*  NEUTROABS 14.3*  --   HGB 15.9* 13.1  HCT 45.2 38.6  MCV 89.2 92.3  PLT 349 811   Basic Metabolic Panel: Recent Labs  Lab 05/16/2020 1252 05/27/20 0515  NA 121* 126*  K 4.5 4.7  CL 75* 87*  CO2 31 27  GLUCOSE 130* 175*  BUN 27* 31*  CREATININE 0.71 0.83  CALCIUM 9.9 8.9   GFR: Estimated Creatinine Clearance: 31.5 mL/min (by C-G formula based on SCr of 0.83 mg/dL). Liver Function Tests: Recent Labs  Lab 05/17/2020 1252 05/27/20 0515  AST 42* 30  ALT 32 26  ALKPHOS 78 61  BILITOT 1.1 0.9  PROT 8.5* 6.4*  ALBUMIN 5.5* 4.0   No results for input(s): LIPASE, AMYLASE in the last 168 hours. No results for input(s): AMMONIA in the  last 168 hours. Coagulation Profile: No results for input(s): INR, PROTIME in the last 168 hours. Cardiac Enzymes: No results for input(s): CKTOTAL, CKMB, CKMBINDEX, TROPONINI in the last 168 hours. BNP (last 3 results) No results for input(s): PROBNP in the last 8760 hours. HbA1C: No results for input(s): HGBA1C in the last 72 hours. CBG: Recent Labs  Lab 05/06/2020 2035  GLUCAP 114*   Lipid Profile: No results for input(s): CHOL, HDL, LDLCALC, TRIG, CHOLHDL, LDLDIRECT in the last 72 hours. Thyroid Function Tests: No results for input(s): TSH,  T4TOTAL, FREET4, T3FREE, THYROIDAB in the last 72 hours. Anemia Panel: No results for input(s): VITAMINB12, FOLATE, FERRITIN, TIBC, IRON, RETICCTPCT in the last 72 hours. Sepsis Labs: Recent Labs  Lab 05/21/2020 1253  LATICACIDVEN 1.4    Recent Results (from the past 240 hour(s))  SARS Coronavirus 2 by RT PCR (hospital order, performed in [facility] hospital lab) Nasopharyngeal Nasopharyngeal Swab     Status: None   Collection Time: 05/25/2020 12:52 PM   Specimen: Nasopharyngeal Swab  Result Value Ref Range Status   SARS Coronavirus 2 NEGATIVE NEGATIVE Final    Comment: (NOTE) SARS-CoV-2 target nucleic acids are NOT DETECTED.  The SARS-CoV-2 RNA is generally detectable in upper and lower respiratory specimens during the acute phase of infection. The lowest concentration of SARS-CoV-2 viral copies this assay can detect is 250 copies / mL. A negative result does not preclude SARS-CoV-2 infection and should not be used as the sole basis for treatment or other patient management decisions.  A negative result may occur with improper specimen collection / handling, submission of specimen other than nasopharyngeal swab, presence of viral mutation(s) within the areas targeted by this assay, and inadequate number of viral copies (<250 copies / mL). A negative result must be combined with clinical observations, patient history, and epidemiological information.  Fact Sheet for Patients:   StrictlyIdeas.no  Fact Sheet for Healthcare Providers: BankingDealers.co.za  This test is not yet approved or  cleared by the [place] FDA and has been authorized for detection and/or diagnosis of SARS-CoV-2 by FDA under an Emergency Use Authorization (EUA).  This EUA will remain in effect (meaning this test can be used) for the duration of the COVID-19 declaration under Section 564(b)(1) of the Act, 21 U.S.C. section 360bbb-3(b)(1), unless the  authorization is terminated or revoked sooner.  Performed at [facility], [address] [place],  [address]          Radiology Studies: DG Chest Portable 1 View  Result Date: 05/10/2020 CLINICAL DATA:  Shortness of breath, COPD EXAM: PORTABLE CHEST 1 VIEW COMPARISON:  05/10/2020 FINDINGS: The heart size and mediastinal contours are within normal limits. Atherosclerotic calcification of the aortic knob. Markedly hyperexpanded lungs with chronically coarsened interstitial markings. No focal airspace consolidation, pleural effusion, or pneumothorax. The visualized skeletal structures are unremarkable. IMPRESSION: Advanced COPD without superimposed acute process. Electronically Signed   By: Davina Poke D.O.   On: 05/08/2020 13:18        Scheduled Meds: . enoxaparin (LOVENOX) injection  30 mg Subcutaneous Q24H  . fluticasone furoate-vilanterol  1 puff Inhalation Daily  . insulin aspart  0-9 Units Subcutaneous TID WC  . ipratropium-albuterol  3 mL Nebulization QID  . loratadine  10 mg Oral Daily  . methylPREDNISolone (SOLU-MEDROL) injection  40 mg Intravenous Q8H  . nicotine  14 mg Transdermal Daily  . sodium chloride flush  3 mL Intravenous Q12H   Continuous Infusions: . azithromycin Stopped (05/04/2020  2246)     LOS: 1 day    Time spent: 34 mins    Wyvonnia Dusky, MD [facility] Pager [phone]  If 7PM-7AM, please contact night-coverage [email or web address] 05/27/2020, 7:18 AM

## 2020-05-28 LAB — GLUCOSE, CAPILLARY
Glucose-Capillary: 149 mg/dL — ABNORMAL HIGH (ref 70–99)
Glucose-Capillary: 127 mg/dL — ABNORMAL HIGH (ref 70–99)
Glucose-Capillary: 98 mg/dL (ref 70–99)

## 2020-05-28 LAB — BASIC METABOLIC PANEL
Anion gap: 8 (ref 5–15)
BUN: 36 mg/dL — ABNORMAL HIGH (ref 8–23)
CO2: 35 mmol/L — ABNORMAL HIGH (ref 22–32)
Calcium: 8.4 mg/dL — ABNORMAL LOW (ref 8.9–10.3)
Chloride: 91 mmol/L — ABNORMAL LOW (ref 98–111)
Creatinine, Ser: 0.73 mg/dL (ref 0.44–1.00)
GFR calc Af Amer: >60 mL/min (ref >60)
GFR calc non Af Amer: >60 mL/min (ref >60)
Glucose, Bld: 166 mg/dL — ABNORMAL HIGH (ref 70–99)
Potassium: 4.3 mmol/L (ref 3.5–5.1)
Sodium: 134 mmol/L — ABNORMAL LOW (ref 135–145)

## 2020-05-28 LAB — CBC
HCT: 38 % (ref 36.0–46.0)
Hemoglobin: 12.9 g/dL (ref 12.0–15.0)
MCH: 31.5 pg (ref 26.0–34.0)
MCHC: 33.9 g/dL (ref 30.0–36.0)
MCV: 92.9 fL (ref 80.0–100.0)
Platelets: 254 10*3/uL (ref 150–400)
RBC: 4.09 MIL/uL (ref 3.87–5.11)
RDW: 12.4 % (ref 11.5–15.5)
WBC: 21.4 10*3/uL — ABNORMAL HIGH (ref 4.0–10.5)
nRBC: 0 % (ref 0.0–0.2)

## 2020-05-28 MED ORDER — LORAZEPAM 2 MG/ML IJ SOLN
0.5000 mg | INTRAMUSCULAR | Status: DC | PRN
  Administered 2020-05-28 – 2020-05-31 (×10): 0.5 mg via INTRAVENOUS
  Filled 2020-05-28 (×10): qty 1

## 2020-05-28 MED ORDER — SODIUM CHLORIDE 0.9 % IV BOLUS
1000.0000 mL | Freq: Once | INTRAVENOUS | Status: AC
  Administered 2020-05-28: 1000 mL via INTRAVENOUS

## 2020-05-28 NOTE — Progress Notes (Addendum)
PROGRESS NOTE    Lindsey Nguyen  YJE:[id number] DOB: April 05, 1951 DOA: 05/01/2020 PCP: Juline Patch, MD   Assessment & Plan:   Active Problems:   COPD exacerbation (HCC)   Pressure injury of skin  COPD exacerbation: continue on IV azithromycin, steroids & bronchodilators. Encourage incentive spirometry. More lethargic & retaining more CO2 so BiPAP restarted. Will transfer to progressive care. Will consult palliative care on 05/30/20 as palliative care is not available over the weekend  Acute on chronic hypoxic respiratory failure: secondary to above. Management as stated above  Hyponatremia: likely secondary to alcohol abuse. D/C IVFs. Almost back WNL  Alcohol abuse: drinks  1-1/2 bottle of wine daily. Last drink day prior to admission. Ethanol level  <10.  Ativan prn for w/drawals  Transaminitis: resolved  Leukocytosis: likely secondary to steroid use. Will continue to monitor  DM2: will hold home dose of metformin. Will continue on SSI w/ accuchecks  HTN: will hold home dose of lisinopril  Malnutrition: likely severe protein. Will continue on magic cups   Smoker: smoking cessation counseling. Nicotine patch to prevent w/drawal     DVT prophylaxis: lovenox Code Status: DNR Family Communication: discussed pt's care w/ pt's niece, Nira Conn, again and answered her questions  Disposition Plan: depends on PT/OT recs   Consultants:      Procedures:    Antimicrobials: azithromycin   Subjective: Pt is very lethargic and unable to answer questions appropriately today  Objective: Vitals:   05/27/20 1647 05/27/20 1951 05/27/20 2129 05/28/20 0637  BP: 122/62  134/76 116/65  Pulse: 100  (!) 102 (!) 101  Resp: 19  18 18   Temp: 97.7 F (36.5 C)  (!) 97.5 F (36.4 C) (!) 97.5 F (36.4 C)  TempSrc: Oral  Oral Oral  SpO2: 98% 99% 97% 100%  Weight:      Height:        Intake/Output Summary (Last 24 hours) at 05/28/2020 0716 Last data filed at 05/28/2020  0500 Gross per 24 hour  Intake 500.02 ml  Output --  Net 500.02 ml   Filed Weights   05/19/2020 1243  Weight: 30.8 kg    Examination:  General exam: Appears lethargic. Cachetic  Respiratory system: decreased breath sounds b/l Cardiovascular system: S1 & S2 +. No  rubs, gallops or clicks.  Gastrointestinal system: Abdomen is nondistended, soft and nontender.  hypoactive bowel sounds heard. Central nervous system: Lethargic. Moves all 4 extremities. Psychiatry: Judgement and insight appear abnormal. Flat mood and affect.     Data Reviewed: I have personally reviewed following labs and imaging studies  CBC: Recent Labs  Lab 05/14/2020 1252 05/27/20 0515 05/28/20 0514  WBC 16.1* 16.7* 21.4*  NEUTROABS 14.3*  --   --   HGB 15.9* 13.1 12.9  HCT 45.2 38.6 38.0  MCV 89.2 92.3 92.9  PLT 349 265 637   Basic Metabolic Panel: Recent Labs  Lab 05/09/2020 1252 05/30/2020 1252 05/27/20 0515 05/27/20 1308 05/27/20 1819 05/27/20 2335 05/28/20 0514  NA 121*   < > 126* 130* 129* 134* 134*  K 4.5  --  4.7  --   --   --  4.3  CL 75*  --  87*  --   --   --  91*  CO2 31  --  27  --   --   --  35*  GLUCOSE 130*  --  175*  --   --   --  166*  BUN 27*  --  31*  --   --   --  36*  CREATININE 0.71  --  0.83  --   --   --  0.73  CALCIUM 9.9  --  8.9  --   --   --  8.4*   < > = values in this interval not displayed.   GFR: Estimated Creatinine Clearance: 32.7 mL/min (by C-G formula based on SCr of 0.73 mg/dL). Liver Function Tests: Recent Labs  Lab 05/24/2020 1252 05/27/20 0515  AST 42* 30  ALT 32 26  ALKPHOS 78 61  BILITOT 1.1 0.9  PROT 8.5* 6.4*  ALBUMIN 5.5* 4.0   No results for input(s): LIPASE, AMYLASE in the last 168 hours. No results for input(s): AMMONIA in the last 168 hours. Coagulation Profile: No results for input(s): INR, PROTIME in the last 168 hours. Cardiac Enzymes: No results for input(s): CKTOTAL, CKMB, CKMBINDEX, TROPONINI in the last 168 hours. BNP (last 3  results) No results for input(s): PROBNP in the last 8760 hours. HbA1C: No results for input(s): HGBA1C in the last 72 hours. CBG: Recent Labs  Lab 05/15/2020 2035 05/27/20 0801 05/27/20 1221 05/27/20 1650 05/27/20 2131  GLUCAP 114* 144* 153* 131* 142*   Lipid Profile: No results for input(s): CHOL, HDL, LDLCALC, TRIG, CHOLHDL, LDLDIRECT in the last 72 hours. Thyroid Function Tests: No results for input(s): TSH, T4TOTAL, FREET4, T3FREE, THYROIDAB in the last 72 hours. Anemia Panel: No results for input(s): VITAMINB12, FOLATE, FERRITIN, TIBC, IRON, RETICCTPCT in the last 72 hours. Sepsis Labs: Recent Labs  Lab 05/11/2020 1253  LATICACIDVEN 1.4    Recent Results (from the past 240 hour(s))  SARS Coronavirus 2 by RT PCR (hospital order, performed in [facility] hospital lab) Nasopharyngeal Nasopharyngeal Swab     Status: None   Collection Time: 05/29/2020 12:52 PM   Specimen: Nasopharyngeal Swab  Result Value Ref Range Status   SARS Coronavirus 2 NEGATIVE NEGATIVE Final    Comment: (NOTE) SARS-CoV-2 target nucleic acids are NOT DETECTED.  The SARS-CoV-2 RNA is generally detectable in upper and lower respiratory specimens during the acute phase of infection. The lowest concentration of SARS-CoV-2 viral copies this assay can detect is 250 copies / mL. A negative result does not preclude SARS-CoV-2 infection and should not be used as the sole basis for treatment or other patient management decisions.  A negative result may occur with improper specimen collection / handling, submission of specimen other than nasopharyngeal swab, presence of viral mutation(s) within the areas targeted by this assay, and inadequate number of viral copies (<250 copies / mL). A negative result must be combined with clinical observations, patient history, and epidemiological information.  Fact Sheet for Patients:   StrictlyIdeas.no  Fact Sheet for Healthcare  Providers: BankingDealers.co.za  This test is not yet approved or  cleared by the [place] FDA and has been authorized for detection and/or diagnosis of SARS-CoV-2 by FDA under an Emergency Use Authorization (EUA).  This EUA will remain in effect (meaning this test can be used) for the duration of the COVID-19 declaration under Section 564(b)(1) of the Act, 21 U.S.C. section 360bbb-3(b)(1), unless the authorization is terminated or revoked sooner.  Performed at [facility], [address], [place], [place] [address]          Radiology Studies: DG Chest Portable 1 View  Result Date: 05/04/2020 CLINICAL DATA:  Shortness of breath, COPD EXAM: PORTABLE CHEST 1 VIEW COMPARISON:  05/10/2020 FINDINGS: The heart size and mediastinal contours are within normal limits. Atherosclerotic calcification of the aortic knob. Markedly hyperexpanded  lungs with chronically coarsened interstitial markings. No focal airspace consolidation, pleural effusion, or pneumothorax. The visualized skeletal structures are unremarkable. IMPRESSION: Advanced COPD without superimposed acute process. Electronically Signed   By: Davina Poke D.O.   On: 05/12/2020 13:18   [place] EKG SITE RITE  Result Date: 05/27/2020 If Site Rite image not attached, placement could not be confirmed due to current cardiac rhythm.       Scheduled Meds: . Chlorhexidine Gluconate Cloth  6 each Topical Daily  . enoxaparin (LOVENOX) injection  30 mg Subcutaneous Q24H  . fluticasone furoate-vilanterol  1 puff Inhalation Daily  . insulin aspart  0-9 Units Subcutaneous TID WC  . ipratropium-albuterol  3 mL Nebulization TID  . lisinopril  20 mg Oral Daily  . loratadine  10 mg Oral Daily  . methylPREDNISolone (SOLU-MEDROL) injection  40 mg Intravenous Q8H  . multivitamin with minerals  1 tablet Oral Daily  . nicotine  14 mg Transdermal Daily  . sodium chloride flush  10-40 mL Intracatheter Q12H  .  sodium chloride flush  3 mL Intravenous Q12H   Continuous Infusions: . azithromycin Stopped (05/27/20 1925)     LOS: 2 days    Time spent: 32 mins    Wyvonnia Dusky, MD [facility] Pager [phone]  If 7PM-7AM, please contact night-coverage [email or web address] 05/28/2020, 7:16 AM

## 2020-05-28 NOTE — Progress Notes (Signed)
   05/28/20 0808  Assess: MEWS Score  Temp 97.8 F (36.6 C)  BP (!) 84/43  Pulse Rate 100  Resp 20  Level of Consciousness Responds to Voice  SpO2 98 %  O2 Device Nasal Cannula  Patient Activity (if Appropriate) In bed  O2 Flow Rate (L/min) 3 L/min  Assess: MEWS Score  MEWS Temp 0  MEWS Systolic 1  MEWS Pulse 0  MEWS RR 0  MEWS LOC 1  MEWS Score 2  MEWS Score Color Yellow  Assess: if the MEWS score is Yellow or Red  Were vital signs taken at a resting state? Yes  Focused Assessment Change from prior assessment (see assessment flowsheet)  Early Detection of Sepsis Score *See Row Information* Low  MEWS guidelines implemented *See Row Information* Yes  Treat  MEWS Interventions Escalated (See documentation below)  Pain Scale PAINAD  Pain Score 0  Breathing 0  Negative Vocalization 0  Facial Expression 0  Body Language 0  Consolability 0  PAINAD Score 0  Take Vital Signs  Increase Vital Sign Frequency  Yellow: Q 2hr X 2 then Q 4hr X 2, if remains yellow, continue Q 4hrs  Escalate  MEWS: Escalate Yellow: discuss with charge nurse/RN and consider discussing with provider and RRT  Notify: Charge Nurse/RN  Name of Charge Nurse/RN Notified Malka Sinway  Date Charge Nurse/RN Notified 05/28/20  Time Charge Nurse/RN Notified 0830  Document  Patient Outcome Other (Comment) (Continue to monitor)  Progress note created (see row info) Yes

## 2020-05-28 NOTE — Progress Notes (Signed)
Patient received from 1 C via bed with BIPAP at 35%. Patient alert and talking.VS taken O2 sat @99 %

## 2020-05-29 LAB — CBC
HCT: 32.7 % — ABNORMAL LOW (ref 36.0–46.0)
Hemoglobin: 10.7 g/dL — ABNORMAL LOW (ref 12.0–15.0)
MCH: 31.3 pg (ref 26.0–34.0)
MCHC: 32.7 g/dL (ref 30.0–36.0)
MCV: 95.6 fL (ref 80.0–100.0)
Platelets: 180 10*3/uL (ref 150–400)
RBC: 3.42 MIL/uL — ABNORMAL LOW (ref 3.87–5.11)
RDW: 12.6 % (ref 11.5–15.5)
WBC: 16.4 10*3/uL — ABNORMAL HIGH (ref 4.0–10.5)
nRBC: 0 % (ref 0.0–0.2)

## 2020-05-29 LAB — BLOOD GAS, ARTERIAL
Acid-Base Excess: 7.4 mmol/L — ABNORMAL HIGH (ref 0.0–2.0)
Bicarbonate: 34.8 mmol/L — ABNORMAL HIGH (ref 20.0–28.0)
Expiratory PAP: 5
FIO2: 0.35
Inspiratory PAP: 10
Mode: POSITIVE
O2 Saturation: 96.7 %
Patient temperature: 37
RATE: 12 resp/min
pCO2 arterial: 63 mmHg — ABNORMAL HIGH (ref 32.0–48.0)
pH, Arterial: 7.35 (ref 7.350–7.450)
pO2, Arterial: 92 mmHg (ref 83.0–108.0)

## 2020-05-29 LAB — BASIC METABOLIC PANEL
Anion gap: 11 (ref 5–15)
BUN: 36 mg/dL — ABNORMAL HIGH (ref 8–23)
CO2: 29 mmol/L (ref 22–32)
Calcium: 8.9 mg/dL (ref 8.9–10.3)
Chloride: 99 mmol/L (ref 98–111)
Creatinine, Ser: 0.7 mg/dL (ref 0.44–1.00)
GFR calc Af Amer: >60 mL/min (ref >60)
GFR calc non Af Amer: >60 mL/min (ref >60)
Glucose, Bld: 167 mg/dL — ABNORMAL HIGH (ref 70–99)
Potassium: 3.7 mmol/L (ref 3.5–5.1)
Sodium: 139 mmol/L (ref 135–145)

## 2020-05-29 LAB — GLUCOSE, CAPILLARY
Glucose-Capillary: 152 mg/dL — ABNORMAL HIGH (ref 70–99)
Glucose-Capillary: 157 mg/dL — ABNORMAL HIGH (ref 70–99)
Glucose-Capillary: 157 mg/dL — ABNORMAL HIGH (ref 70–99)
Glucose-Capillary: 166 mg/dL — ABNORMAL HIGH (ref 70–99)

## 2020-05-29 MED ORDER — FUROSEMIDE 10 MG/ML IJ SOLN
20.0000 mg | Freq: Once | INTRAMUSCULAR | Status: AC
  Administered 2020-05-29: 20 mg via INTRAVENOUS
  Filled 2020-05-29: qty 2

## 2020-05-29 MED ORDER — METOPROLOL TARTRATE 5 MG/5ML IV SOLN
5.0000 mg | Freq: Three times a day (TID) | INTRAVENOUS | Status: DC | PRN
  Administered 2020-05-29 – 2020-05-31 (×4): 5 mg via INTRAVENOUS
  Filled 2020-05-29 (×5): qty 5

## 2020-05-29 NOTE — Plan of Care (Signed)
  Problem: Education: Goal: Knowledge of General Education information will improve Description: Including pain rating scale, medication(s)/side effects and non-pharmacologic comfort measures Outcome: Progressing   Problem: Education: Goal: Knowledge of disease or condition will improve Outcome: Progressing   Problem: Education: Goal: Knowledge of the prescribed therapeutic regimen will improve Outcome: Progressing

## 2020-05-29 NOTE — Progress Notes (Signed)
Pt assessed. No change noted. Pt sleeping bi pap in place. Charge nurse notified. Will continue to monitor.

## 2020-05-29 NOTE — Progress Notes (Signed)
Pt assessed HR elevated PRN Metoprolol 5mg  IV administered. Charge RN & MD notified. Will continue to monitor.

## 2020-05-29 NOTE — Progress Notes (Addendum)
PROGRESS NOTE    Lindsey Nguyen  UEA:[id number] DOB: [birth year] DOA: 05/09/2020 PCP: Juline Patch, MD   Assessment & Plan:   Active Problems:   COPD exacerbation (HCC)   Pressure injury of skin  COPD exacerbation: severe. Continue on IV azithromycin, steroids & bronchodilators. Encourage incentive spirometry. Still lethargic today, continue w/ BiPAP and wean as tolerated. Will consult palliative care on 05/30/20 as palliative care is not available over the weekend  Acute on chronic hypercarbic respiratory failure: secondary to above. Management as stated above  Hyponatremia: resolved   Alcohol abuse: drinks  1-1/2 bottle of wine daily. Last drink day prior to admission. Ethanol level  <10.  Ativan prn for w/drawals  Transaminitis: resolved  Leukocytosis: likely secondary to steroid use. Will continue to monitor  DM2: will hold home dose of metformin. Will continue on SSI w/ accuchecks  HTN: will hold home dose of lisinopril  Malnutrition: severe protein. BMI 10.3. Will continue on magic cups. Complicates overall prognosis   Smoker: smoking cessation counseling. Nicotine patch to prevent w/drawal     DVT prophylaxis: lovenox Code Status: DNR Family Communication:  Disposition Plan: depends on PT/OT recs   Consultants:      Procedures:    Antimicrobials: azithromycin   Subjective: Pt is still very lethargic today & not able to answer my questions again today   Objective: Vitals:   05/29/20 0735 05/29/20 0736 05/29/20 0737 05/29/20 0738  BP: 132/72     Pulse: (!) 111 (!) 109 (!) 108 (!) 106  Resp: (!) 21 (!) 32 (!) 33 (!) 32  Temp:      TempSrc:      SpO2: 96% 95% 96% 95%  Weight:      Height:        Intake/Output Summary (Last 24 hours) at 05/29/2020 0752 Last data filed at 05/29/2020 0528 Gross per 24 hour  Intake 1259.46 ml  Output 700 ml  Net 559.46 ml   Filed Weights   05/03/2020 1243 05/29/20 0300  Weight: 30.8 kg 26.5 kg     Examination:  General exam: Appears lethargic. Cachetic  Respiratory system: decreased breath sounds b/l Cardiovascular system: S1 & S2 +. No  rubs, gallops or clicks.  Gastrointestinal system: Abdomen is nondistended, soft and nontender. Hypoactive bowel sounds heard. Central nervous system: Lethargic. Moves all 4 extremities. Psychiatry: Judgement and insight appear abnormal. Flat mood and affect    Data Reviewed: I have personally reviewed following labs and imaging studies  CBC: Recent Labs  Lab 05/16/2020 1252 05/27/20 0515 05/28/20 0514 05/29/20 0509  WBC 16.1* 16.7* 21.4* 16.4*  NEUTROABS 14.3*  --   --   --   HGB 15.9* 13.1 12.9 10.7*  HCT 45.2 38.6 38.0 32.7*  MCV 89.2 92.3 92.9 95.6  PLT 349 265 254 478   Basic Metabolic Panel: Recent Labs  Lab 05/22/2020 1252 05/22/2020 1252 05/27/20 0515 05/27/20 0515 05/27/20 1308 05/27/20 1819 05/27/20 2335 05/28/20 0514 05/29/20 0509  NA 121*   < > 126*   < > 130* 129* 134* 134* 139  K 4.5  --  4.7  --   --   --   --  4.3 3.7  CL 75*  --  87*  --   --   --   --  91* 99  CO2 31  --  27  --   --   --   --  35* 29  GLUCOSE 130*  --  175*  --   --   --   --  166* 167*  BUN 27*  --  31*  --   --   --   --  36* 36*  CREATININE 0.71  --  0.83  --   --   --   --  0.73 0.70  CALCIUM 9.9  --  8.9  --   --   --   --  8.4* 8.9   < > = values in this interval not displayed.   GFR: Estimated Creatinine Clearance: 28.2 mL/min (by C-G formula based on SCr of 0.7 mg/dL). Liver Function Tests: Recent Labs  Lab 05/29/2020 1252 05/27/20 0515  AST 42* 30  ALT 32 26  ALKPHOS 78 61  BILITOT 1.1 0.9  PROT 8.5* 6.4*  ALBUMIN 5.5* 4.0   No results for input(s): LIPASE, AMYLASE in the last 168 hours. No results for input(s): AMMONIA in the last 168 hours. Coagulation Profile: No results for input(s): INR, PROTIME in the last 168 hours. Cardiac Enzymes: No results for input(s): CKTOTAL, CKMB, CKMBINDEX, TROPONINI in the last 168  hours. BNP (last 3 results) No results for input(s): PROBNP in the last 8760 hours. HbA1C: No results for input(s): HGBA1C in the last 72 hours. CBG: Recent Labs  Lab 05/27/20 2131 05/28/20 0805 05/28/20 1648 05/28/20 2119 05/29/20 0736  GLUCAP 142* 149* 127* 98 152*   Lipid Profile: No results for input(s): CHOL, HDL, LDLCALC, TRIG, CHOLHDL, LDLDIRECT in the last 72 hours. Thyroid Function Tests: No results for input(s): TSH, T4TOTAL, FREET4, T3FREE, THYROIDAB in the last 72 hours. Anemia Panel: No results for input(s): VITAMINB12, FOLATE, FERRITIN, TIBC, IRON, RETICCTPCT in the last 72 hours. Sepsis Labs: Recent Labs  Lab 05/09/2020 1253  LATICACIDVEN 1.4    Recent Results (from the past 240 hour(s))  SARS Coronavirus 2 by RT PCR (hospital order, performed in [facility] hospital lab) Nasopharyngeal Nasopharyngeal Swab     Status: None   Collection Time: 05/29/2020 12:52 PM   Specimen: Nasopharyngeal Swab  Result Value Ref Range Status   SARS Coronavirus 2 NEGATIVE NEGATIVE Final    Comment: (NOTE) SARS-CoV-2 target nucleic acids are NOT DETECTED.  The SARS-CoV-2 RNA is generally detectable in upper and lower respiratory specimens during the acute phase of infection. The lowest concentration of SARS-CoV-2 viral copies this assay can detect is 250 copies / mL. A negative result does not preclude SARS-CoV-2 infection and should not be used as the sole basis for treatment or other patient management decisions.  A negative result may occur with improper specimen collection / handling, submission of specimen other than nasopharyngeal swab, presence of viral mutation(s) within the areas targeted by this assay, and inadequate number of viral copies (<250 copies / mL). A negative result must be combined with clinical observations, patient history, and epidemiological information.  Fact Sheet for Patients:   StrictlyIdeas.no  Fact Sheet for Healthcare  Providers: BankingDealers.co.za  This test is not yet approved or  cleared by the [place] FDA and has been authorized for detection and/or diagnosis of SARS-CoV-2 by FDA under an Emergency Use Authorization (EUA).  This EUA will remain in effect (meaning this test can be used) for the duration of the COVID-19 declaration under Section 564(b)(1) of the Act, 21 U.S.C. section 360bbb-3(b)(1), unless the authorization is terminated or revoked sooner.  Performed at [facility], [address] [place], [place] [address]          Radiology Studies: No results found.      Scheduled Meds: . Chlorhexidine Gluconate  Cloth  6 each Topical Daily  . enoxaparin (LOVENOX) injection  30 mg Subcutaneous Q24H  . fluticasone furoate-vilanterol  1 puff Inhalation Daily  . furosemide  20 mg Intravenous Once  . insulin aspart  0-9 Units Subcutaneous TID WC  . ipratropium-albuterol  3 mL Nebulization TID  . loratadine  10 mg Oral Daily  . methylPREDNISolone (SOLU-MEDROL) injection  40 mg Intravenous Q8H  . multivitamin with minerals  1 tablet Oral Daily  . nicotine  14 mg Transdermal Daily  . sodium chloride flush  10-40 mL Intracatheter Q12H  . sodium chloride flush  3 mL Intravenous Q12H   Continuous Infusions: . azithromycin Stopped (05/28/20 2125)     LOS: 3 days    Time spent: 31 mins    Wyvonnia Dusky, MD [facility] Pager [phone]  If 7PM-7AM, please contact night-coverage [email or web address] 05/29/2020, 7:52 AM

## 2020-05-29 NOTE — Progress Notes (Signed)
Pt assessed no change in condition. No complaints noted. Pt continue on Bi pap. Charge nurse notified. Will continue to monitor.

## 2020-05-29 NOTE — Progress Notes (Signed)
Pt restless trying to get out of bed, pulling at bi pap. PRN administered. Family at the bedside. Will continue to monitor.

## 2020-05-29 NOTE — Progress Notes (Signed)
Vitals rechecked MEWS Yellow continue yellow protocol

## 2020-05-29 NOTE — Progress Notes (Signed)
Pt assessed MEWS previously Yellow, no changes noted. Continue Vitals Q2hrs

## 2020-05-29 NOTE — Progress Notes (Signed)
Pt assessed. HR trending down. No distress noted, pt appears to be sleeping. Continue to wear bi pap. Family at the bedside. MEWs Yellow will follow Yellow MEWS protocol vitals Q2Hrs.

## 2020-05-29 NOTE — Progress Notes (Signed)
Pt monitor was recording every minute and documenting MEWS. Charge RN aware that pt had an updated MEWS every minute according to the monitors continuous reading. Pt Has been assessed by this RN not changes have been noted. Will continue with The Yellow MEWS Assessment q2Hrs.

## 2020-05-29 NOTE — Progress Notes (Signed)
Pt assessed vitals rechecked no change noted MEWS Green Will continue to monitor

## 2020-05-30 LAB — BASIC METABOLIC PANEL
Anion gap: 14 (ref 5–15)
BUN: 31 mg/dL — ABNORMAL HIGH (ref 8–23)
CO2: 32 mmol/L (ref 22–32)
Calcium: 9.2 mg/dL (ref 8.9–10.3)
Chloride: 103 mmol/L (ref 98–111)
Creatinine, Ser: 0.75 mg/dL (ref 0.44–1.00)
GFR calc Af Amer: >60 mL/min (ref >60)
GFR calc non Af Amer: >60 mL/min (ref >60)
Glucose, Bld: 154 mg/dL — ABNORMAL HIGH (ref 70–99)
Potassium: 3.4 mmol/L — ABNORMAL LOW (ref 3.5–5.1)
Sodium: 149 mmol/L — ABNORMAL HIGH (ref 135–145)

## 2020-05-30 LAB — CBC
HCT: 33.1 % — ABNORMAL LOW (ref 36.0–46.0)
Hemoglobin: 10.6 g/dL — ABNORMAL LOW (ref 12.0–15.0)
MCH: 30.8 pg (ref 26.0–34.0)
MCHC: 32 g/dL (ref 30.0–36.0)
MCV: 96.2 fL (ref 80.0–100.0)
Platelets: 191 10*3/uL (ref 150–400)
RBC: 3.44 MIL/uL — ABNORMAL LOW (ref 3.87–5.11)
RDW: 12.6 % (ref 11.5–15.5)
WBC: 13.7 10*3/uL — ABNORMAL HIGH (ref 4.0–10.5)
nRBC: 0 % (ref 0.0–0.2)

## 2020-05-30 LAB — BLOOD GAS, ARTERIAL
Acid-Base Excess: 3 mmol/L — ABNORMAL HIGH (ref 0.0–2.0)
Bicarbonate: 34 mmol/L — ABNORMAL HIGH (ref 20.0–28.0)
FIO2: 32
O2 Saturation: 97.7 %
Patient temperature: 37
pCO2 arterial: 89 mmHg (ref 32.0–48.0)
pO2, Arterial: 120 mmHg — ABNORMAL HIGH (ref 83.0–108.0)

## 2020-05-30 LAB — GLUCOSE, CAPILLARY
Glucose-Capillary: 138 mg/dL — ABNORMAL HIGH (ref 70–99)
Glucose-Capillary: 144 mg/dL — ABNORMAL HIGH (ref 70–99)
Glucose-Capillary: 139 mg/dL — ABNORMAL HIGH (ref 70–99)
Glucose-Capillary: 169 mg/dL — ABNORMAL HIGH (ref 70–99)

## 2020-05-30 MED ORDER — LISINOPRIL 20 MG PO TABS: 20.0000 mg | ORAL_TABLET | Freq: Every day | ORAL | Status: DC

## 2020-05-30 NOTE — Progress Notes (Signed)
PROGRESS NOTE    Lindsey Nguyen  EBR:[id number] DOB: October 20, 1950 DOA: 05/08/2020 PCP: Juline Patch, MD   Assessment & Plan:   Active Problems:   COPD exacerbation (HCC)   Pressure injury of skin  COPD exacerbation: severe. Continue on IV azithromycin, steroids & bronchodilators. Encourage incentive spirometry. Unchanged from day prior, continue on BiPAP. Palliative care consulted today  Acute on chronic hypercarbic respiratory failure: secondary to above. Management as stated above  Hyponatremia: resolved   Alcohol abuse: drinks  1-1/2 bottle of wine daily. Last drink day prior to admission. Ethanol level  <10.  Ativan prn for w/drawals  Transaminitis: resolved  Leukocytosis: likely secondary to steroid use. Will continue to monitor  DM2: will hold home dose of metformin. Will continue on SSI w/ accuchecks  HTN: will restart home dose of lisinopril   Malnutrition: severe protein. BMI 10.3. Will continue on magic cups. Complicates overall prognosis   Smoker: smoking cessation counseling. Nicotine patch to prevent w/drawal     DVT prophylaxis: lovenox Code Status: DNR Family Communication: discussed pt's care w/ pt's niece, Nira Conn, and answered her questions  Disposition Plan: depends on PT/OT recs  Status is: Inpatient  Remains inpatient appropriate because:Altered mental status secondary to hypercarbic respiratory failure from severe COPD. Guarded prognosis. Palliative care consulted today    Dispo: The patient is from: Home              Anticipated d/c is to: SNF              Anticipated d/c date is: 3 days              Patient currently is not medically stable to d/c.   Consultants:      Procedures:    Antimicrobials: azithromycin   Subjective: Pt is unchanged from day prior and still unable to answer questions appropriately.    Objective: Vitals:   05/30/20 0121 05/30/20 0432 05/30/20 0635 05/30/20 0638  BP: 124/78 (!) 153/84      Pulse: (!) 109 (!) 110 (!) 121 93  Resp: 20 19    Temp:  97.7 F (36.5 C)    TempSrc:  Oral    SpO2: 99% 98%    Weight:      Height:        Intake/Output Summary (Last 24 hours) at 05/30/2020 0729 Last data filed at 05/29/2020 1820 Gross per 24 hour  Intake 0 ml  Output 1000 ml  Net -1000 ml   Filed Weights   05/23/2020 1243 05/29/20 0300  Weight: 30.8 kg 26.5 kg    Examination:  General exam:Appears lethargic. Cachetic Respiratory system:diminished breath sounds b/l. No rales Cardiovascular system:S1 &S2 +. No rubs, gallops or clicks.  Gastrointestinal system:Abdomen is nondistended, soft and nontender. Hypoactive bowel sounds  Central nervous system:Lethargic. Moves all 4 extremities. Psychiatry:Judgement and insight appear abnormal. Flat mood and affect.    Data Reviewed: I have personally reviewed following labs and imaging studies  CBC: Recent Labs  Lab 05/21/2020 1252 05/27/20 0515 05/28/20 0514 05/29/20 0509 05/30/20 0443  WBC 16.1* 16.7* 21.4* 16.4* 13.7*  NEUTROABS 14.3*  --   --   --   --   HGB 15.9* 13.1 12.9 10.7* 10.6*  HCT 45.2 38.6 38.0 32.7* 33.1*  MCV 89.2 92.3 92.9 95.6 96.2  PLT 349 265 254 180 088   Basic Metabolic Panel: Recent Labs  Lab 05/28/2020 1252 05/10/2020 1252 05/27/20 0515 05/27/20 0515 05/27/20 1308 05/27/20 1819 05/27/20 2335 05/28/20 1103  05/29/20 0509  NA 121*   < > 126*   < > 130* 129* 134* 134* 139  K 4.5  --  4.7  --   --   --   --  4.3 3.7  CL 75*  --  87*  --   --   --   --  91* 99  CO2 31  --  27  --   --   --   --  35* 29  GLUCOSE 130*  --  175*  --   --   --   --  166* 167*  BUN 27*  --  31*  --   --   --   --  36* 36*  CREATININE 0.71  --  0.83  --   --   --   --  0.73 0.70  CALCIUM 9.9  --  8.9  --   --   --   --  8.4* 8.9   < > = values in this interval not displayed.   GFR: Estimated Creatinine Clearance: 28.2 mL/min (by C-G formula based on SCr of 0.7 mg/dL). Liver Function Tests: Recent Labs   Lab 05/11/2020 1252 05/27/20 0515  AST 42* 30  ALT 32 26  ALKPHOS 78 61  BILITOT 1.1 0.9  PROT 8.5* 6.4*  ALBUMIN 5.5* 4.0   No results for input(s): LIPASE, AMYLASE in the last 168 hours. No results for input(s): AMMONIA in the last 168 hours. Coagulation Profile: No results for input(s): INR, PROTIME in the last 168 hours. Cardiac Enzymes: No results for input(s): CKTOTAL, CKMB, CKMBINDEX, TROPONINI in the last 168 hours. BNP (last 3 results) No results for input(s): PROBNP in the last 8760 hours. HbA1C: No results for input(s): HGBA1C in the last 72 hours. CBG: Recent Labs  Lab 05/28/20 2119 05/29/20 0736 05/29/20 1120 05/29/20 1626 05/29/20 2051  GLUCAP 98 152* 157* 157* 166*   Lipid Profile: No results for input(s): CHOL, HDL, LDLCALC, TRIG, CHOLHDL, LDLDIRECT in the last 72 hours. Thyroid Function Tests: No results for input(s): TSH, T4TOTAL, FREET4, T3FREE, THYROIDAB in the last 72 hours. Anemia Panel: No results for input(s): VITAMINB12, FOLATE, FERRITIN, TIBC, IRON, RETICCTPCT in the last 72 hours. Sepsis Labs: Recent Labs  Lab 05/02/2020 1253  LATICACIDVEN 1.4    Recent Results (from the past 240 hour(s))  SARS Coronavirus 2 by RT PCR (hospital order, performed in [facility] hospital lab) Nasopharyngeal Nasopharyngeal Swab     Status: None   Collection Time: 05/15/2020 12:52 PM   Specimen: Nasopharyngeal Swab  Result Value Ref Range Status   SARS Coronavirus 2 NEGATIVE NEGATIVE Final    Comment: (NOTE) SARS-CoV-2 target nucleic acids are NOT DETECTED.  The SARS-CoV-2 RNA is generally detectable in upper and lower respiratory specimens during the acute phase of infection. The lowest concentration of SARS-CoV-2 viral copies this assay can detect is 250 copies / mL. A negative result does not preclude SARS-CoV-2 infection and should not be used as the sole basis for treatment or other patient management decisions.  A negative result may occur  with improper specimen collection / handling, submission of specimen other than nasopharyngeal swab, presence of viral mutation(s) within the areas targeted by this assay, and inadequate number of viral copies (<250 copies / mL). A negative result must be combined with clinical observations, patient history, and epidemiological information.  Fact Sheet for Patients:   StrictlyIdeas.no  Fact Sheet for Healthcare Providers: BankingDealers.co.za  This test is not yet approved  or  cleared by the [place] and has been authorized for detection and/or diagnosis of SARS-CoV-2 by FDA under an Emergency Use Authorization (EUA).  This EUA will remain in effect (meaning this test can be used) for the duration of the COVID-19 declaration under Section 564(b)(1) of the Act, 21 U.S.C. section 360bbb-3(b)(1), unless the authorization is terminated or revoked sooner.  Performed at [facility], [address], [place], [place] [address]          Radiology Studies: No results found.      Scheduled Meds: . Chlorhexidine Gluconate Cloth  6 each Topical Daily  . enoxaparin (LOVENOX) injection  30 mg Subcutaneous Q24H  . fluticasone furoate-vilanterol  1 puff Inhalation Daily  . insulin aspart  0-9 Units Subcutaneous TID WC  . ipratropium-albuterol  3 mL Nebulization TID  . loratadine  10 mg Oral Daily  . methylPREDNISolone (SOLU-MEDROL) injection  40 mg Intravenous Q8H  . multivitamin with minerals  1 tablet Oral Daily  . nicotine  14 mg Transdermal Daily  . sodium chloride flush  10-40 mL Intracatheter Q12H  . sodium chloride flush  3 mL Intravenous Q12H   Continuous Infusions: . azithromycin 500 mg (05/29/20 1737)     LOS: 4 days    Time spent: 30 mins    Wyvonnia Dusky, MD [facility] Pager [phone]  If 7PM-7AM, please contact night-coverage [email or web address] 05/30/2020, 7:29 AM

## 2020-05-30 NOTE — Progress Notes (Signed)
Morphine and ativan given IVP as pt has been restless and trying to get oob several times tonight.  ST 115 on the monitor.  O2 sat is 97% on continuous bipap.   Will cont to observe.

## 2020-05-30 NOTE — Progress Notes (Signed)
Patient remains yellow MEWS d/t her LOC. Her vitals are stable and she remains on a bipap.  Will continue to monitor vitals q4hr.

## 2020-05-30 NOTE — Progress Notes (Signed)
Patients niece requested that patient be given lorazepam.  Medication has been administered.

## 2020-05-31 DIAGNOSIS — Z515 Encounter for palliative care: Secondary | ICD-10-CM

## 2020-05-31 DIAGNOSIS — Z7189 Other specified counseling: Secondary | ICD-10-CM

## 2020-05-31 LAB — GLUCOSE, CAPILLARY
Glucose-Capillary: 176 mg/dL — ABNORMAL HIGH (ref 70–99)
Glucose-Capillary: 107 mg/dL — ABNORMAL HIGH (ref 70–99)
Glucose-Capillary: 56 mg/dL — ABNORMAL LOW (ref 70–99)

## 2020-05-31 LAB — BASIC METABOLIC PANEL
Anion gap: 15 (ref 5–15)
BUN: 26 mg/dL — ABNORMAL HIGH (ref 8–23)
CO2: 37 mmol/L — ABNORMAL HIGH (ref 22–32)
Calcium: 9.4 mg/dL (ref 8.9–10.3)
Chloride: 100 mmol/L (ref 98–111)
Creatinine, Ser: 0.74 mg/dL (ref 0.44–1.00)
GFR calc Af Amer: >60 mL/min (ref >60)
GFR calc non Af Amer: >60 mL/min (ref >60)
Glucose, Bld: 154 mg/dL — ABNORMAL HIGH (ref 70–99)
Potassium: 2.9 mmol/L — ABNORMAL LOW (ref 3.5–5.1)
Sodium: 152 mmol/L — ABNORMAL HIGH (ref 135–145)

## 2020-05-31 LAB — CBC
HCT: 36.2 % (ref 36.0–46.0)
Hemoglobin: 12 g/dL (ref 12.0–15.0)
MCH: 31.3 pg (ref 26.0–34.0)
MCHC: 33.1 g/dL (ref 30.0–36.0)
MCV: 94.5 fL (ref 80.0–100.0)
Platelets: 178 10*3/uL (ref 150–400)
RBC: 3.83 MIL/uL — ABNORMAL LOW (ref 3.87–5.11)
RDW: 12.7 % (ref 11.5–15.5)
WBC: 14.1 10*3/uL — ABNORMAL HIGH (ref 4.0–10.5)
nRBC: 0 % (ref 0.0–0.2)

## 2020-05-31 LAB — MAGNESIUM: Magnesium: 1.7 mg/dL (ref 1.7–2.4)

## 2020-05-31 MED ORDER — POTASSIUM CHLORIDE 10 MEQ/100ML IV SOLN
10.0000 meq | INTRAVENOUS | Status: AC
  Administered 2020-05-31 (×6): 10 meq via INTRAVENOUS
  Filled 2020-05-31 (×6): qty 100

## 2020-05-31 MED ORDER — DEXTROSE 50 % IV SOLN
INTRAVENOUS | Status: AC
  Filled 2020-05-31: qty 50

## 2020-05-31 MED ORDER — NICOTINE 14 MG/24HR TD PT24
14.0000 mg | MEDICATED_PATCH | Freq: Every day | TRANSDERMAL | Status: DC
  Administered 2020-06-01: 14 mg via TRANSDERMAL
  Filled 2020-05-31: qty 1

## 2020-05-31 MED ORDER — DEXTROSE 5 % IV SOLN: INTRAVENOUS | Status: DC

## 2020-05-31 MED ORDER — MORPHINE SULFATE (PF) 2 MG/ML IV SOLN
1.0000 mg | INTRAVENOUS | Status: DC | PRN
  Administered 2020-05-31 – 2020-06-01 (×2): 1 mg via INTRAVENOUS
  Filled 2020-05-31 (×2): qty 1

## 2020-05-31 MED ORDER — DEXTROSE 50 % IV SOLN
12.5000 g | INTRAVENOUS | Status: AC
  Administered 2020-05-31: 12.5 g via INTRAVENOUS

## 2020-05-31 NOTE — Progress Notes (Signed)
Hypoglycemic Event  CBG: 56  Treatment: 12.5mg  of d50  Symptoms:  none  Follow-up CBG: Time:05/31/2020  CBG Result:107  Possible Reasons for Event: NPO  Comments/MD notified: Dr. Jimmye Norman aware.     Lindsey Nguyen Lindsey Nguyen

## 2020-05-31 NOTE — Progress Notes (Signed)
PROGRESS NOTE    Lindsey Nguyen  ASN:[id number] DOB: 01-18-1951 DOA: 05/20/2020 PCP: Juline Patch, MD   Assessment & Plan:   Active Problems:   COPD exacerbation (HCC)   Pressure injury of skin  COPD exacerbation: severe. Continue on IV azithromycin, steroids & bronchodilators. Encourage incentive spirometry. Unchanged from day prior, continue on BiPAP. Palliative care will have family meeting tomorrow at 10AM  Acute on chronic hypercarbic respiratory failure: secondary to above. Management as stated above  Hyponatremia: resolved   Hypernatremia: will start D5W. Free water deficit is 1.0L   Alcohol abuse: drinks  1-1/2 bottle of wine daily. Last drink day prior to admission. Ethanol level  <10.  Ativan prn for w/drawals  Transaminitis: resolved  Leukocytosis: likely secondary to steroid use. Will continue to monitor  DM2: will hold home dose of metformin. Will continue on SSI w/ accuchecks  HTN: will restart home dose of lisinopril, hold for MAP <65  Malnutrition: severe protein. BMI 10.3. Will continue on magic cups. Complicates overall prognosis   Smoker: smoking cessation counseling. Nicotine patch to prevent w/drawal     DVT prophylaxis: lovenox Code Status: DNR Family Communication: discussed pt's care w/ pt's niece, Nira Conn, again today and answered her questions  Disposition Plan: depends on family meeting tomorrow at [place]  Status is: Inpatient  Remains inpatient appropriate because:Altered mental status secondary to hypercarbic respiratory failure from severe COPD. Guarded prognosis. Family meeting tomorrow at [place]: The patient is from: Home              Anticipated d/c is to: SNF vs hospice               Anticipated d/c date is: 3 days              Patient currently is not medically stable to d/c.   Consultants:      Procedures:    Antimicrobials: azithromycin   Subjective: Pt is awake but unable to answer questions  appropriately.   Objective: Vitals:   05/31/20 0200 05/31/20 0356 05/31/20 0500 05/31/20 0718  BP:  128/79    Pulse:  (!) 108    Resp: 18 20 19    Temp:  97.8 F (36.6 C)    TempSrc:      SpO2:  100%  97%  Weight:  27.2 kg    Height:        Intake/Output Summary (Last 24 hours) at 05/31/2020 0746 Last data filed at 05/30/2020 1852 Gross per 24 hour  Intake --  Output 900 ml  Net -900 ml   Filed Weights   05/25/2020 1243 05/29/20 0300 05/31/20 0356  Weight: 30.8 kg 26.5 kg 27.2 kg    Examination:  General exam:Appearslethargic. Cachetic Respiratory system:decreased breath sounds b/l.  Cardiovascular system:S1 &S2 +. No rubs, gallops or clicks.  Gastrointestinal system:Abdomen is nondistended, soft and nontender.Hypoactive bowel sounds  Central nervous system:Lethargic. Moves all 4 extremities. Psychiatry:Judgement and insight appearabnormal. Flat mood and affect.    Data Reviewed: I have personally reviewed following labs and imaging studies  CBC: Recent Labs  Lab 05/25/2020 1252 05/30/2020 1252 05/27/20 0515 05/28/20 0514 05/29/20 0509 05/30/20 0443 05/31/20 0526  WBC 16.1*   < > 16.7* 21.4* 16.4* 13.7* 14.1*  NEUTROABS 14.3*  --   --   --   --   --   --   HGB 15.9*   < > 13.1 12.9 10.7* 10.6* 12.0  HCT 45.2   < > 38.6  38.0 32.7* 33.1* 36.2  MCV 89.2   < > 92.3 92.9 95.6 96.2 94.5  PLT 349   < > 265 254 180 191 178   < > = values in this interval not displayed.   Basic Metabolic Panel: Recent Labs  Lab 05/27/20 0515 05/27/20 1308 05/27/20 2335 05/28/20 0514 05/29/20 0509 05/30/20 0443 05/31/20 0526  NA 126*   < > 134* 134* 139 149* 152*  K 4.7  --   --  4.3 3.7 3.4* 2.9*  CL 87*  --   --  91* 99 103 100  CO2 27  --   --  35* 29 32 37*  GLUCOSE 175*  --   --  166* 167* 154* 154*  BUN 31*  --   --  36* 36* 31* 26*  CREATININE 0.83  --   --  0.73 0.70 0.75 0.74  CALCIUM 8.9  --   --  8.4* 8.9 9.2 9.4   < > = values in this interval not  displayed.   GFR: Estimated Creatinine Clearance: 28.9 mL/min (by C-G formula based on SCr of 0.74 mg/dL). Liver Function Tests: Recent Labs  Lab 05/09/2020 1252 05/27/20 0515  AST 42* 30  ALT 32 26  ALKPHOS 78 61  BILITOT 1.1 0.9  PROT 8.5* 6.4*  ALBUMIN 5.5* 4.0   No results for input(s): LIPASE, AMYLASE in the last 168 hours. No results for input(s): AMMONIA in the last 168 hours. Coagulation Profile: No results for input(s): INR, PROTIME in the last 168 hours. Cardiac Enzymes: No results for input(s): CKTOTAL, CKMB, CKMBINDEX, TROPONINI in the last 168 hours. BNP (last 3 results) No results for input(s): PROBNP in the last 8760 hours. HbA1C: No results for input(s): HGBA1C in the last 72 hours. CBG: Recent Labs  Lab 05/29/20 2051 05/30/20 0925 05/30/20 1123 05/30/20 1737 05/30/20 2122  GLUCAP 166* 138* 144* 139* 169*   Lipid Profile: No results for input(s): CHOL, HDL, LDLCALC, TRIG, CHOLHDL, LDLDIRECT in the last 72 hours. Thyroid Function Tests: No results for input(s): TSH, T4TOTAL, FREET4, T3FREE, THYROIDAB in the last 72 hours. Anemia Panel: No results for input(s): VITAMINB12, FOLATE, FERRITIN, TIBC, IRON, RETICCTPCT in the last 72 hours. Sepsis Labs: Recent Labs  Lab 05/31/2020 1253  LATICACIDVEN 1.4    Recent Results (from the past 240 hour(s))  SARS Coronavirus 2 by RT PCR (hospital order, performed in [facility] hospital lab) Nasopharyngeal Nasopharyngeal Swab     Status: None   Collection Time: 05/22/2020 12:52 PM   Specimen: Nasopharyngeal Swab  Result Value Ref Range Status   SARS Coronavirus 2 NEGATIVE NEGATIVE Final    Comment: (NOTE) SARS-CoV-2 target nucleic acids are NOT DETECTED.  The SARS-CoV-2 RNA is generally detectable in upper and lower respiratory specimens during the acute phase of infection. The lowest concentration of SARS-CoV-2 viral copies this assay can detect is 250 copies / mL. A negative result does not preclude SARS-CoV-2  infection and should not be used as the sole basis for treatment or other patient management decisions.  A negative result may occur with improper specimen collection / handling, submission of specimen other than nasopharyngeal swab, presence of viral mutation(s) within the areas targeted by this assay, and inadequate number of viral copies (<250 copies / mL). A negative result must be combined with clinical observations, patient history, and epidemiological information.  Fact Sheet for Patients:   StrictlyIdeas.no  Fact Sheet for Healthcare Providers: BankingDealers.co.za  This test is not yet approved or  cleared by the [place] and has been authorized for detection and/or diagnosis of SARS-CoV-2 by FDA under an Emergency Use Authorization (EUA).  This EUA will remain in effect (meaning this test can be used) for the duration of the COVID-19 declaration under Section 564(b)(1) of the Act, 21 U.S.C. section 360bbb-3(b)(1), unless the authorization is terminated or revoked sooner.  Performed at [facility], [address], [place], [place] [address]          Radiology Studies: No results found.      Scheduled Meds: . Chlorhexidine Gluconate Cloth  6 each Topical Daily  . enoxaparin (LOVENOX) injection  30 mg Subcutaneous Q24H  . fluticasone furoate-vilanterol  1 puff Inhalation Daily  . insulin aspart  0-9 Units Subcutaneous TID WC  . ipratropium-albuterol  3 mL Nebulization TID  . lisinopril  20 mg Oral Daily  . loratadine  10 mg Oral Daily  . methylPREDNISolone (SOLU-MEDROL) injection  40 mg Intravenous Q8H  . multivitamin with minerals  1 tablet Oral Daily  . nicotine  14 mg Transdermal Daily  . sodium chloride flush  10-40 mL Intracatheter Q12H   Continuous Infusions: . azithromycin 500 mg (05/30/20 1746)  . potassium chloride       LOS: 5 days    Time spent: 35 mins    Wyvonnia Dusky, MD [facility] Pager [phone]  If 7PM-7AM, please contact night-coverage [email or web address] 05/31/2020, 7:46 AM

## 2020-05-31 NOTE — TOC Progression Note (Signed)
Transition of Care [facility]) - Progression Note    Patient Details  Name: Lindsey Nguyen MRN: [id number] Date of Birth: 09/19/1951  Transition of Care [facility]) CM/SW [place], RN Phone Number: 05/31/2020, 9:35 AM  Clinical Narrative:    Case manager for [facility] is Izora Gala [phone], she will help with auth if needed for SNF.  Today only Juliann Pulse is covering her number is [phone].        Expected Discharge Plan and Services                                                 Social Determinants of Health (SDOH) Interventions    Readmission Risk Interventions No flowsheet data found.

## 2020-05-31 NOTE — Consult Note (Signed)
Consultation Note Date: 05/31/2020   Patient Name: Lindsey Nguyen  DOB: 1951-08-20  MRN: [id number]  Age / Sex: 69 y.o., female  PCP: Juline Patch, MD Referring Physician: Wyvonnia Dusky, MD  Reason for Consultation: Establishing goals of care  HPI/Patient Profile: Lindsey Nguyen is a 69 y.o. female with the below listed past medical history presents to the ER for worsening shortness of breath.  According to epic notes, per EMS patient was sent by PCP due to needing "more fluids".  Clinical Assessment and Goals of Care: Patient is resting in bed on BIPAP with mittens in place. She is very thin, and is frail. She does not answer questions.   Called to speak with her niece. Niece confirms she and her brother are patient's only living relatives.   She states patient has been living at home. She has a paid caregiver that comes in to help her 3 hours per day. She states the caregiver helps with cooking and cleaning. Functionally, niece states Lindsey Nguyen has not been bathing. She has not been moving from her chair because of fear of falling. She has not been eating because of fear of choking. Niece advises patient has never been a big eater, and has drank ETOH and smoked for many years.   We discussed her diagnosis, prognosis, GOC, EOL wishes disposition and options.  A detailed discussion was had today regarding advanced directives.  Concepts specific to code status, artifical feeding and hydration, IV antibiotics and rehospitalization were discussed.  The difference between an aggressive medical intervention path and a comfort care path was discussed.  Values and goals of care important to patient and family were attempted to be elicited.  Discussed limitations of medical interventions to prolong quality of life in some situations and discussed the concept of human mortality.   Natural trajectory and  expectations at EOL were discussed.  Questions and concerns addressed.     Niece states she and her brother tried to get Lindsey Nguyen to move home to [place] last year, but she would not. She states Lindsey Nguyen has been very clear she is not happy with her current QOL and has stated she does not want to live as she is. She states she will talk with her brother and discuss plans moving forward. She is leaning heavily toward shifting to comfort care.  Will meet in the morning at 10:00.      SUMMARY OF RECOMMENDATIONS   Remeet tomorrow at 10:00. Continue current care at this time. Likely hospital death if patient is removed from BIPAP and made comfort care.   Prognosis:   < 2 weeks      Primary Diagnoses: Present on Admission: . COPD exacerbation ([place])   I have reviewed the medical record, interviewed the patient and family, and examined the patient. The following aspects are pertinent.  Past Medical History:  Diagnosis Date  . Cancer [facility])    right breast cancer with lumpectomy and rad tx  . COPD (chronic obstructive pulmonary disease) (  [place])   . Diabetes mellitus without complication ([place])   . Hypertension   . Osteoporosis    Social History   Socioeconomic History  . Marital status: Divorced    Spouse name: Not on file  . Number of children: Not on file  . Years of education: Not on file  . Highest education level: Not on file  Occupational History  . Not on file  Tobacco Use  . Smoking status: Current Every Day Smoker    Packs/day: 0.75    Years: 50.00    Pack years: 37.50    Types: Cigarettes  . Smokeless tobacco: Never Used  Substance and Sexual Activity  . Alcohol use: Yes    Alcohol/week: 21.0 standard drinks    Types: 14 Glasses of wine, 7 Cans of beer per week  . Drug use: No  . Sexual activity: Never  Other Topics Concern  . Not on file  Social History Narrative  . Not on file   Social Determinants of Health   Financial Resource Strain:   .  Difficulty of Paying Living Expenses: Not on file  Food Insecurity:   . Worried About Charity fundraiser in the Last Year: Not on file  . Ran Out of Food in the Last Year: Not on file  Transportation Needs:   . Lack of Transportation (Medical): Not on file  . Lack of Transportation (Non-Medical): Not on file  Physical Activity:   . Days of Exercise per Week: Not on file  . Minutes of Exercise per Session: Not on file  Stress:   . Feeling of Stress : Not on file  Social Connections:   . Frequency of Communication with Friends and Family: Not on file  . Frequency of Social Gatherings with Friends and Family: Not on file  . Attends Religious Services: Not on file  . Active Member of Clubs or Organizations: Not on file  . Attends Archivist Meetings: Not on file  . Marital Status: Not on file   Family History  Problem Relation Age of Onset  . Diabetes Father   . Kidney cancer Father   . Cancer Brother   . Diabetes Paternal Aunt   . Diabetes Paternal Uncle   . Kidney failure Mother    Scheduled Meds: . Chlorhexidine Gluconate Cloth  6 each Topical Daily  . enoxaparin (LOVENOX) injection  30 mg Subcutaneous Q24H  . fluticasone furoate-vilanterol  1 puff Inhalation Daily  . insulin aspart  0-9 Units Subcutaneous TID WC  . ipratropium-albuterol  3 mL Nebulization TID  . lisinopril  20 mg Oral Daily  . loratadine  10 mg Oral Daily  . methylPREDNISolone (SOLU-MEDROL) injection  40 mg Intravenous Q8H  . multivitamin with minerals  1 tablet Oral Daily  . [START ON 06/01/2020] nicotine  14 mg Transdermal Daily  . sodium chloride flush  10-40 mL Intracatheter Q12H   Continuous Infusions: . azithromycin 500 mg (05/30/20 1746)  . potassium chloride 10 mEq (05/31/20 1054)   PRN Meds:.bisacodyl, docusate sodium, LORazepam, metoprolol tartrate, morphine injection, ondansetron (ZOFRAN) IV, sodium chloride flush, traMADol Medications Prior to Admission:  Prior to Admission  medications   Medication Sig Start Date End Date Taking? Authorizing Provider  albuterol (VENTOLIN HFA) 108 (90 Base) MCG/ACT inhaler TAKE 2 PUFFS BY MOUTH EVERY 6 HOURS AS NEEDED FOR WHEEZE OR SHORTNESS OF BREATH 05/20/20   Juline Patch, MD  fluticasone furoate-vilanterol (BREO ELLIPTA) 200-25 MCG/INH AEPB Inhale 1 puff into the lungs  daily. 05/14/20   Fritzi Mandes, MD  ipratropium-albuterol (DUONEB) 0.5-2.5 (3) MG/3ML SOLN Take 3 mLs by nebulization every 6 (six) hours as needed. 05/13/20   Fritzi Mandes, MD  lisinopril (ZESTRIL) 10 MG tablet Take 1 tablet (10 mg total) by mouth 2 (two) times daily before a meal. 05/20/20   Juline Patch, MD  loratadine (CLARITIN) 10 MG tablet Take 1 tablet (10 mg total) by mouth daily. 05/20/20   Juline Patch, MD  metFORMIN (GLUCOPHAGE) 500 MG tablet Take 1 tablet (500 mg total) by mouth daily. 05/20/20   Juline Patch, MD  Multiple Vitamin (MULTIVITAMIN WITH MINERALS) TABS tablet Take 1 tablet by mouth daily. 05/14/20   Fritzi Mandes, MD  predniSONE (DELTASONE) 10 MG tablet Take 50 mg daily taper by 10 mg daily and thereafter 20 mg qd 05/14/20   Fritzi Mandes, MD  tiotropium (SPIRIVA) 18 MCG inhalation capsule Place 1 capsule (18 mcg total) into inhaler and inhale daily. 05/14/20   Fritzi Mandes, MD   Allergies  Allergen Reactions  . Tetanus Toxoids Swelling   Review of Systems  Unable to perform ROS   Physical Exam Constitutional:      Comments: Opens eyes. Does not speak.      Vital Signs: BP 132/73 (BP Location: Left Leg)   Pulse 93   Temp 97.9 F (36.6 C)   Resp 19   Ht 5\' 3"  (1.6 m)   Wt 27.2 kg   SpO2 97%   BMI 10.62 kg/m  Pain Scale: 0-10   Pain Score: 0-No pain   SpO2: SpO2: 97 % O2 Device:SpO2: 97 % O2 Flow Rate: .O2 Flow Rate (L/min): 35 L/min  IO: Intake/output summary:   Intake/Output Summary (Last 24 hours) at 05/31/2020 1152 Last data filed at 05/31/2020 8412 Gross per 24 hour  Intake --  Output 1200 ml  Net -1200 ml     LBM: Last BM Date: 05/29/20 Baseline Weight: Weight: 30.8 kg Most recent weight: Weight: 27.2 kg     Palliative Assessment/Data:      Time In: 10:45 Time Out: 12:55 Time Total: 70 min Greater than 50%  of this time was spent counseling and coordinating care related to the above assessment and plan.  Signed by: Asencion Gowda, NP   Please contact Palliative Medicine Team phone at [phone] for questions and concerns.  For individual provider: See Shea Evans

## 2020-06-01 ENCOUNTER — Encounter: Payer: Self-pay | Admitting: Internal Medicine

## 2020-06-01 DIAGNOSIS — J441 Chronic obstructive pulmonary disease with (acute) exacerbation: Principal | ICD-10-CM

## 2020-06-01 LAB — BASIC METABOLIC PANEL
Anion gap: 6 (ref 5–15)
BUN: 23 mg/dL (ref 8–23)
CO2: 39 mmol/L — ABNORMAL HIGH (ref 22–32)
Calcium: 8.9 mg/dL (ref 8.9–10.3)
Chloride: 98 mmol/L (ref 98–111)
Creatinine, Ser: 0.58 mg/dL (ref 0.44–1.00)
GFR calc Af Amer: >60 mL/min (ref >60)
GFR calc non Af Amer: >60 mL/min (ref >60)
Glucose, Bld: 254 mg/dL — ABNORMAL HIGH (ref 70–99)
Potassium: 4 mmol/L (ref 3.5–5.1)
Sodium: 143 mmol/L (ref 135–145)

## 2020-06-01 LAB — CBC
HCT: 33.2 % — ABNORMAL LOW (ref 36.0–46.0)
Hemoglobin: 10.4 g/dL — ABNORMAL LOW (ref 12.0–15.0)
MCH: 31.1 pg (ref 26.0–34.0)
MCHC: 31.3 g/dL (ref 30.0–36.0)
MCV: 99.4 fL (ref 80.0–100.0)
Platelets: 158 10*3/uL (ref 150–400)
RBC: 3.34 MIL/uL — ABNORMAL LOW (ref 3.87–5.11)
RDW: 12.8 % (ref 11.5–15.5)
WBC: 13.7 10*3/uL — ABNORMAL HIGH (ref 4.0–10.5)
nRBC: 0.1 % (ref 0.0–0.2)

## 2020-06-01 MED ORDER — MORPHINE BOLUS VIA INFUSION
1.0000 mg | INTRAVENOUS | Status: DC | PRN
  Administered 2020-06-01: 2 mg via INTRAVENOUS
  Filled 2020-06-01: qty 2

## 2020-06-01 MED ORDER — MORPHINE 100MG IN NS 100ML (1MG/ML) PREMIX INFUSION
2.0000 mg/h | INTRAVENOUS | Status: DC
  Administered 2020-06-01: 2 mg/h via INTRAVENOUS

## 2020-06-01 MED ORDER — BIOTENE DRY MOUTH MT LIQD: 15.0000 mL | OROMUCOSAL | Status: DC | PRN

## 2020-06-01 MED ORDER — POLYVINYL ALCOHOL 1.4 % OP SOLN
1.0000 [drp] | Freq: Four times a day (QID) | OPHTHALMIC | Status: DC | PRN
  Filled 2020-06-01: qty 15

## 2020-06-01 MED ORDER — ONDANSETRON HCL 4 MG/2ML IJ SOLN: 4.0000 mg | Freq: Four times a day (QID) | INTRAMUSCULAR | Status: DC | PRN

## 2020-06-01 MED ORDER — ACETAMINOPHEN 650 MG RE SUPP: 650.0000 mg | Freq: Four times a day (QID) | RECTAL | Status: DC | PRN

## 2020-06-01 MED ORDER — GLYCOPYRROLATE 0.2 MG/ML IJ SOLN
0.2000 mg | INTRAMUSCULAR | Status: DC | PRN
  Filled 2020-06-01: qty 1

## 2020-06-01 MED ORDER — LORAZEPAM 2 MG/ML IJ SOLN: 0.5000 mg | INTRAMUSCULAR | Status: DC | PRN

## 2020-06-01 MED ORDER — HALOPERIDOL LACTATE 5 MG/ML IJ SOLN: 0.5000 mg | INTRAMUSCULAR | Status: DC | PRN

## 2020-06-01 MED ORDER — ONDANSETRON 4 MG PO TBDP
4.0000 mg | ORAL_TABLET | Freq: Four times a day (QID) | ORAL | Status: DC | PRN
  Filled 2020-06-01: qty 1

## 2020-06-01 NOTE — Progress Notes (Signed)
Patient changed to comfort care. Family at bedside. Foley and picc line remain intake per palliative request. Cardiac monitor discontinued. Will continue to monitor.

## 2020-06-01 NOTE — Progress Notes (Signed)
Nutrition Brief Note    Pt being followed by nutrition department during admission secondary to severe malnutrition.   Chart reviewed.  Pt has now transitioning to comfort care.   No further nutrition interventions warranted at this time.  Please re-consult as needed.   Lajuan Lines, RD, LDN Clinical Nutrition After Hours/Weekend Pager # in [place]

## 2020-06-01 NOTE — Progress Notes (Signed)
Daily Progress Note   Patient Name: Lindsey Nguyen       Date: 06/01/2020 DOB: September 15, 1951  Age: 69 y.o. MRN#: [id number] Attending Physician: Jennye Boroughs, MD Primary Care Physician: Juline Patch, MD Admit Date: 05/29/2020  Reason for Consultation/Follow-up: Establishing goals of care and Withdrawal of life-sustaining treatment  Subjective: Patient is resting in bed with mittens in place. Niece is at bedside. She re-confirms the patient has no HPOA or legal guardian, is not married, has no living parents or living children, and has no living siblings. She states she and her brother (patient's niece and nephew) are the only living relatives. Her brother was called by phone as he lives in [place]. They state they have spoken and wish to liberate her from BIPAP as well as any other life prolonging or sustaining measure, and focus on keeping her comfortable until death. They understand she will die in the hospital and will not transfer to hospice facility. They state they want to be sure she will have everything she needs so that she does not have pain or suffering, as niece states she is concerned she will have suffering when taken off BIPAP. Assured her orders for comfort medications would be placed.  I completed a MOST form today and the signed original was placed in the chart. A photocopy was placed in the chart to be scanned into EMR. The patient's niece and nephew (next of kin) outlined their wishes for the following treatment decisions:  Cardiopulmonary Resuscitation: Do Not Attempt Resuscitation (DNR/No CPR)  Medical Interventions: Comfort Measures: Keep clean, warm, and dry. Use medication by any route, positioning, wound care, and other measures to relieve pain and suffering. Use oxygen,  suction and manual treatment of airway obstruction as needed for comfort. Do not transfer to the hospital unless comfort needs cannot be met in current location.  Antibiotics: No antibiotics (use other measures to relieve symptoms)  IV Fluids: No IV fluids (provide other measures to ensure comfort)  Feeding Tube: No feeding tube      Length of Stay: 6  Current Medications: Scheduled Meds:  . Chlorhexidine Gluconate Cloth  6 each Topical Daily  . enoxaparin (LOVENOX) injection  30 mg Subcutaneous Q24H  . fluticasone furoate-vilanterol  1 puff Inhalation Daily  . ipratropium-albuterol  3 mL Nebulization TID  .  lisinopril  20 mg Oral Daily  . loratadine  10 mg Oral Daily  . methylPREDNISolone (SOLU-MEDROL) injection  40 mg Intravenous Q8H  . multivitamin with minerals  1 tablet Oral Daily  . nicotine  14 mg Transdermal Daily  . sodium chloride flush  10-40 mL Intracatheter Q12H    Continuous Infusions: . azithromycin 500 mg (05/31/20 1655)  . dextrose 75 mL/hr at 06/01/20 0217    PRN Meds: bisacodyl, docusate sodium, LORazepam, metoprolol tartrate, morphine injection, ondansetron (ZOFRAN) IV, sodium chloride flush, traMADol  Physical Exam Constitutional:      Comments: Eyes closed. Very thin and frail.   Pulmonary:     Comments: On BIPAP            Vital Signs: BP (!) 143/75 (BP Location: Left Arm)   Pulse 90   Temp 97.6 F (36.4 C) (Axillary)   Resp 16   Ht $R'5\' 3"'es$  (1.6 m)   Wt 27.2 kg   SpO2 100%   BMI 10.62 kg/m  SpO2: SpO2: 100 % O2 Device: O2 Device: Bi-PAP O2 Flow Rate: O2 Flow Rate (L/min): 35 L/min  Intake/output summary:   Intake/Output Summary (Last 24 hours) at 06/01/2020 1034 Last data filed at 05/31/2020 1623 Gross per 24 hour  Intake 1281.17 ml  Output --  Net 1281.17 ml   LBM: Last BM Date: 05/29/20 Baseline Weight: Weight: 30.8 kg Most recent weight: Weight: 27.2 kg       Palliative Assessment/Data:      Patient Active Problem List    Diagnosis Date Noted  . Pressure injury of skin 05/27/2020  . COPD exacerbation ([place]) 05/12/2020  . Tobacco abuse 05/11/2020  . Alcohol use 05/11/2020  . Hyponatremia 05/11/2020  . Protein-calorie malnutrition, severe 07/29/2019  . COPD with acute exacerbation ([place]) 07/27/2019  . Acute pansinusitis 04/02/2018  . Chronic bronchitis with acute exacerbation ([place]) 04/02/2018  . Acute on chronic respiratory failure with hypoxia ([place]) 10/28/2017  . Diabetes mellitus without complication ([place]) 93/71/6967  . Aortic atherosclerosis ([place]) 05/08/2016  . Allergic rhinitis due to pollen 05/08/2016  . Excessive weight loss 02/28/2016  . Essential hypertension 05/19/2015  . Prediabetes 05/19/2015  . Osteoporosis 05/19/2015  . COLD (chronic obstructive lung disease) ([place]) 05/19/2015    Palliative Care Assessment & Plan    Recommendations/Plan:  Orders placed for comfort care.  Morphine infusion with boluses for pain, dyspnea, or respiratory distress.  Ativan for anxiety or withdrawal symptoms  Haldol for agitation  Rubinol for excessive secretions  Zofran for nausea  No referral made for hospice as a hospital death is anticipated.    Code Status:    Code Status Orders  (From admission, onward)         Start     Ordered   05/20/2020 1609  Do not attempt resuscitation (DNR)  Continuous       Question Answer Comment  In the event of cardiac or respiratory ARREST Do not call a "code blue"   In the event of cardiac or respiratory ARREST Do not perform Intubation, CPR, defibrillation or ACLS   In the event of cardiac or respiratory ARREST Use medication by any route, position, wound care, and other measures to relive pain and suffering. May use oxygen, suction and manual treatment of airway obstruction as needed for comfort.      05/08/2020 1609        Code Status History    Date Active Date Inactive Code Status Order ID Comments User Context  05/11/2020 0812 05/13/2020 2224 Full  Code [id number]  Ivor Costa, MD ED   07/27/2019 2313 07/29/2019 1740 Full Code [id number]  Lang Snow, NP ED   10/28/2017 2052 10/31/2017 1734 Full Code [id number]  Henreitta Leber, MD Inpatient   04/10/2016 1217 04/10/2016 1935 Full Code [id number]  Schnier, Dolores Lory, MD Inpatient   Advance Care Planning Activity    Advance Directive Documentation     Most Recent Value  Type of Advance Directive Out of facility DNR (pink MOST or yellow form)  Pre-existing out of facility DNR order (yellow form or pink MOST form) --  "MOST" Form in Place? --       Prognosis:   Hours - Days    Care plan was discussed with RN. MD IM'd.   Thank you for allowing the Palliative Medicine Team to assist in the care of this patient.   Time In: 10:15 Time Out: 11:00 Total Time 45 min Prolonged Time Billed  no      Greater than 50%  of this time was spent counseling and coordinating care related to the above assessment and plan.  Asencion Gowda, NP  Please contact Palliative Medicine Team phone at [phone] for questions and concerns.

## 2020-06-01 NOTE — Progress Notes (Addendum)
Progress Note    Lindsey Nguyen  ERD:[id number] DOB: [birth year]  DOA: 05/13/2020 PCP: Juline Patch, MD      Brief Narrative:    Medical records reviewed and are as summarized below:  Lindsey Nguyen is a 69 y.o. 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  Pressure Injury 05/30/2020 Sacrum Mid Stage 1 -  Intact skin with non-blanchable redness of a localized area usually over a bony prominence. (Active)  05/07/2020 2130  Location: Sacrum  Location Orientation: Mid  Staging: Stage 1 -  Intact skin with non-blanchable redness of a localized area usually over a bony prominence.  Wound Description (Comments):   Present on Admission: Yes         PLAN  Patient has been transitioned to comfort care per family's request IV morphine as needed for pain IV Ativan as needed for anxiety Continue comfort care protocol Prognosis is poor Discussed with palliative care nurse practitioner Plan discussed with her niece, Nira Conn, who confirmed that patient is now comfort care   Diet Order            Diet NPO time specified  Diet effective now                    Consultants:  None  Procedures:  None    Medications:   . methylPREDNISolone (SOLU-MEDROL) injection  40 mg Intravenous Q8H   Continuous Infusions: . morphine 2 mg/hr (06/01/20 1530)     Anti-infectives (From admission,  onward)   Start     Dose/Rate Route Frequency Ordered Stop   05/14/2020 1800  azithromycin (ZITHROMAX) 500 mg in sodium chloride 0.9 % 250 mL IVPB  Status:  Discontinued        500 mg 250 mL/hr over 60 Minutes Intravenous Every 24 hours 05/11/2020 1600 06/01/20 1040             Family Communication/Anticipated D/C date and plan/Code Status   DVT prophylaxis:      Code Status: DNR  Family Communication: Plan discussed with her niece, Heather Disposition Plan:    Status is: Inpatient  Remains inpatient appropriate because:Inpatient level of care appropriate due to severity of illness   Dispo: The patient is from: Home              Anticipated d/c is to: Home with hospice              Anticipated d/c date is: 1 day              Patient currently is not medically stable to d/c.           Subjective:   Patient is unable to provide any history  Objective:    Vitals:   05/31/20 2216 06/01/20 0550 06/01/20 0700 06/01/20 0752  BP: (!) 142/71 137/78  (!) 143/75  Pulse: 96 80  90  Resp:    16  Temp: 97.7 F (36.5 C) 97.7 F (36.5 C)  97.6 F (  36.4 C)  TempSrc: Oral Oral  Axillary  SpO2: 97% 100% 100% 100%  Weight:      Height:       No data found.   Intake/Output Summary (Last 24 hours) at 06/01/2020 1749 Last data filed at 06/01/2020 1530 Gross per 24 hour  Intake 1635.58 ml  Output 30 ml  Net 1605.58 ml   Filed Weights   05/22/2020 1243 05/29/20 0300 05/31/20 0356  Weight: 30.8 kg 26.5 kg 27.2 kg    Exam:  GEN: ill-looking, cachetic, on BiPAP SKIN: warm and dry.  Stage I sacral decubitus ulcer EYES: Anicteric, no pallor ENT: MMM CV: RRR PULM: CTA B ABD: soft, ND, NT, +BS CNS: Alert and oriented to person only.  She is confused. EXT: No edema or tenderness   Data Reviewed:   I have personally reviewed following labs and imaging studies:  Labs: Labs show the following:   Basic Metabolic Panel: Recent Labs  Lab 05/28/20 0514  05/28/20 0514 05/29/20 0509 05/29/20 0509 05/30/20 0443 05/30/20 0443 05/31/20 0526 06/01/20 0545  NA 134*  --  139  --  149*  --  152* 143  K 4.3   < > 3.7   < > 3.4*   < > 2.9* 4.0  CL 91*  --  99  --  103  --  100 98  CO2 35*  --  29  --  32  --  37* 39*  GLUCOSE 166*  --  167*  --  154*  --  154* 254*  BUN 36*  --  36*  --  31*  --  26* 23  CREATININE 0.73  --  0.70  --  0.75  --  0.74 0.58  CALCIUM 8.4*  --  8.9  --  9.2  --  9.4 8.9  MG  --   --   --   --   --   --  1.7  --    < > = values in this interval not displayed.   GFR Estimated Creatinine Clearance: 28.9 mL/min (by C-G formula based on SCr of 0.58 mg/dL). Liver Function Tests: Recent Labs  Lab 05/23/2020 1252 05/27/20 0515  AST 42* 30  ALT 32 26  ALKPHOS 78 61  BILITOT 1.1 0.9  PROT 8.5* 6.4*  ALBUMIN 5.5* 4.0   No results for input(s): LIPASE, AMYLASE in the last 168 hours. No results for input(s): AMMONIA in the last 168 hours. Coagulation profile No results for input(s): INR, PROTIME in the last 168 hours.  CBC: Recent Labs  Lab 05/03/2020 1252 05/27/20 0515 05/28/20 0514 05/29/20 0509 05/30/20 0443 05/31/20 0526 06/01/20 0545  WBC 16.1*   < > 21.4* 16.4* 13.7* 14.1* 13.7*  NEUTROABS 14.3*  --   --   --   --   --   --   HGB 15.9*   < > 12.9 10.7* 10.6* 12.0 10.4*  HCT 45.2   < > 38.0 32.7* 33.1* 36.2 33.2*  MCV 89.2   < > 92.9 95.6 96.2 94.5 99.4  PLT 349   < > 254 180 191 178 158   < > = values in this interval not displayed.   Cardiac Enzymes: No results for input(s): CKTOTAL, CKMB, CKMBINDEX, TROPONINI in the last 168 hours. BNP (last 3 results) No results for input(s): PROBNP in the last 8760 hours. CBG: Recent Labs  Lab 05/30/20 1737 05/30/20 2122 05/31/20 0823 05/31/20 1152 05/31/20 1226  GLUCAP 139* 169* 176* 56*  107*   D-Dimer: No results for input(s): DDIMER in the last 72 hours. Hgb A1c: No results for input(s): HGBA1C in the last 72 hours. Lipid Profile: No results  for input(s): CHOL, HDL, LDLCALC, TRIG, CHOLHDL, LDLDIRECT in the last 72 hours. Thyroid function studies: No results for input(s): TSH, T4TOTAL, T3FREE, THYROIDAB in the last 72 hours.  Invalid input(s): FREET3 Anemia work up: No results for input(s): VITAMINB12, FOLATE, FERRITIN, TIBC, IRON, RETICCTPCT in the last 72 hours. Sepsis Labs: Recent Labs  Lab 05/31/2020 1253 05/27/20 0515 05/29/20 0509 05/30/20 0443 05/31/20 0526 06/01/20 0545  WBC  --    < > 16.4* 13.7* 14.1* 13.7*  LATICACIDVEN 1.4  --   --   --   --   --    < > = values in this interval not displayed.    Microbiology Recent Results (from the past 240 hour(s))  SARS Coronavirus 2 by RT PCR (hospital order, performed in [facility] hospital lab) Nasopharyngeal Nasopharyngeal Swab     Status: None   Collection Time: 05/02/2020 12:52 PM   Specimen: Nasopharyngeal Swab  Result Value Ref Range Status   SARS Coronavirus 2 NEGATIVE NEGATIVE Final    Comment: (NOTE) SARS-CoV-2 target nucleic acids are NOT DETECTED.  The SARS-CoV-2 RNA is generally detectable in upper and lower respiratory specimens during the acute phase of infection. The lowest concentration of SARS-CoV-2 viral copies this assay can detect is 250 copies / mL. A negative result does not preclude SARS-CoV-2 infection and should not be used as the sole basis for treatment or other patient management decisions.  A negative result Nguyen occur with improper specimen collection / handling, submission of specimen other than nasopharyngeal swab, presence of viral mutation(s) within the areas targeted by this assay, and inadequate number of viral copies (<250 copies / mL). A negative result must be combined with clinical observations, patient history, and epidemiological information.  Fact Sheet for Patients:   StrictlyIdeas.no  Fact Sheet for Healthcare Providers: BankingDealers.co.za  This test is not yet  approved or  cleared by the [place] FDA and has been authorized for detection and/or diagnosis of SARS-CoV-2 by FDA under an Emergency Use Authorization (EUA).  This EUA will remain in effect (meaning this test can be used) for the duration of the COVID-19 declaration under Section 564(b)(1) of the Act, 21 U.S.C. section 360bbb-3(b)(1), unless the authorization is terminated or revoked sooner.  Performed at [facility], [address] [place], [place] [address]     Procedures and diagnostic studies:  No results found.             LOS: 6 days   Caelin Rosen  [facility]   Pager on [email or web address]. If 7PM-7AM, please contact night-coverage at [email or web address]     06/01/2020, 5:49 PM

## 2020-06-01 DEATH — deceased

## 2020-06-02 DIAGNOSIS — Z515 Encounter for palliative care: Secondary | ICD-10-CM | POA: Diagnosis not present

## 2020-07-01 NOTE — Death Summary Note (Signed)
Death Summary      Lindsey Nguyen TXH:[id number] DOB: 12-Apr-1951 DOA: Jun 19, 2020  PCP: Juline Patch, MD   Admit date: 06-19-20 Date of Death: 06/27/20   Final Diagnoses:   Principal Problem:   COPD exacerbation ([place]) Active Problems:   Diabetes mellitus without complication ([place])   Protein-calorie malnutrition, severe   Alcohol use   Pressure injury of skin       Hospital Course:   Ms. 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  She had been recently discharged from the hospital on 05/13/2020 for COPD exacerbation and acute hypoxemic respiratory failure.    She presented to the hospital again on 2020/06/19 because of shortness of breath.  She was admitted to the hospital for COPD exacerbation and acute on chronic hypoxemic and hypercapnic respiratory failure.  She was treated with empiric IV antibiotics, steroids and bronchodilators.  She also required BiPAP for acute respiratory failure.  She developed hyponatremia, hypokalemia and hypernatremia that were treated as well. Unfortunately, her condition did not improve so Nira Conn, legal guardian (niece), requested comfort measures.  Her condition deteriorated and she expired on Jun 26, 2020 at 11:57 AM.   Time of death: 11:57 am  Signed:  Jennye Boroughs  [facility] 2020/06/27, 9:37 PM

## 2020-07-01 NOTE — Progress Notes (Addendum)
Daily Progress Note   Patient Name: Lindsey Nguyen       Date: 06-28-20 DOB: 12-19-1950  Age: 69 y.o. MRN#: [id number] Attending Physician: Jennye Boroughs, MD Primary Care Physician: Juline Patch, MD Admit Date: 05/06/2020  Reason for Consultation/Follow-up: Establishing goals of care and Withdrawal of life-sustaining treatment  Subjective: Patient is resting in bed with niece at bedside. Patient has irregular breathing with 20-30 second pauses. Her toes are purple/blue. Patient appear comfortable. No changes to medications at this time. Primary MD in to bedside to see patient. Offer for chaplain service and niece declined. Anticipated hospital death.      Length of Stay: 7  Current Medications: Scheduled Meds:    Continuous Infusions: . morphine 2 mg/hr (06/01/20 1530)    PRN Meds: acetaminophen, antiseptic oral rinse, glycopyrrolate, haloperidol lactate, LORazepam, morphine, ondansetron **OR** ondansetron (ZOFRAN) IV, polyvinyl alcohol  Physical Exam Constitutional:      Comments: Eyes closed. Very thin and frail.   Pulmonary:     Comments: On BIPAP Skin:    Comments: Toes are bluish purple and cool to touch.              Vital Signs: BP (!) 49/25 (BP Location: Right Leg)   Pulse (!) 110   Temp 97.9 F (36.6 C) (Oral)   Resp (!) 3   Ht 5\' 3"  (1.6 m)   Wt 27.2 kg   SpO2 96%   BMI 10.62 kg/m  SpO2: SpO2: 96 % O2 Device: O2 Device: Nasal Cannula O2 Flow Rate: O2 Flow Rate (L/min): 2 L/min  Intake/output summary:   Intake/Output Summary (Last 24 hours) at 06-28-20 1120 Last data filed at 06/01/2020 1530 Gross per 24 hour  Intake 9.45 ml  Output 30 ml  Net -20.55 ml   LBM: Last BM Date: 05/29/20 Baseline Weight: Weight: 30.8 kg Most recent weight: Weight:  27.2 kg       Palliative Assessment/Data:    Flowsheet Rows     Most Recent Value  Intake Tab  Referral Department Hospitalist  Unit at Time of Referral Cardiac/Telemetry Unit  Palliative Care Primary Diagnosis Pulmonary  Date Notified 05/30/20  Palliative Care Type New Palliative care  Reason for referral Clarify Goals of Care  Date of Admission 05/19/2020  Date first seen by Palliative Care 05/31/20  #  of days Palliative referral response time 1 Day(s)  # of days IP prior to Palliative referral 4  Clinical Assessment  Psychosocial & Spiritual Assessment  Palliative Care Outcomes      Patient Active Problem List   Diagnosis Date Noted  . Pressure injury of skin 05/27/2020  . COPD exacerbation ([place]) 05/12/2020  . Tobacco abuse 05/11/2020  . Alcohol use 05/11/2020  . Hyponatremia 05/11/2020  . Protein-calorie malnutrition, severe 07/29/2019  . COPD with acute exacerbation ([place]) 07/27/2019  . Acute pansinusitis 04/02/2018  . Chronic bronchitis with acute exacerbation ([place]) 04/02/2018  . Acute on chronic respiratory failure with hypoxia ([place]) 10/28/2017  . Diabetes mellitus without complication ([place]) 65/99/3570  . Aortic atherosclerosis ([place]) 05/08/2016  . Allergic rhinitis due to pollen 05/08/2016  . Excessive weight loss 02/28/2016  . Essential hypertension 05/19/2015  . Prediabetes 05/19/2015  . Osteoporosis 05/19/2015  . COLD (chronic obstructive lung disease) ([place]) 05/19/2015    Palliative Care Assessment & Plan    Recommendations/Plan:  Orders placed for comfort care.  Morphine infusion with boluses for pain, dyspnea, or respiratory distress.  Ativan for anxiety or withdrawal symptoms  Haldol for agitation  Rubinol for excessive secretions  Zofran for nausea  No referral made for hospice as a hospital death is anticipated.   In dying process.   Code Status:    Code Status Orders  (From admission, onward)         Start     Ordered    05/25/2020 1609  Do not attempt resuscitation (DNR)  Continuous       Question Answer Comment  In the event of cardiac or respiratory ARREST Do not call a "code blue"   In the event of cardiac or respiratory ARREST Do not perform Intubation, CPR, defibrillation or ACLS   In the event of cardiac or respiratory ARREST Use medication by any route, position, wound care, and other measures to relive pain and suffering. May use oxygen, suction and manual treatment of airway obstruction as needed for comfort.      05/15/2020 1609        Code Status History    Date Active Date Inactive Code Status Order ID Comments User Context   05/11/2020 0812 05/13/2020 2224 Full Code [id number]  Ivor Costa, MD ED   07/27/2019 2313 07/29/2019 1740 Full Code [id number]  Lang Snow, NP ED   10/28/2017 2052 10/31/2017 1734 Full Code [id number]  Henreitta Leber, MD Inpatient   04/10/2016 1217 04/10/2016 1935 Full Code [id number]  Schnier, Dolores Lory, MD Inpatient   Advance Care Planning Activity    Advance Directive Documentation     Most Recent Value  Type of Advance Directive Out of facility DNR (pink MOST or yellow form)  Pre-existing out of facility DNR order (yellow form or pink MOST form) --  "MOST" Form in Place? --       Prognosis:   Hours - Days    Care plan was discussed with MD  Thank you for allowing the Palliative Medicine Team to assist in the care of this patient.   Total Time 35 min Prolonged Time Billed  no      Greater than 50%  of this time was spent counseling and coordinating care related to the above assessment and plan.  Asencion Gowda, NP  Please contact Palliative Medicine Team phone at [phone] for questions and concerns.

## 2020-07-01 NOTE — Progress Notes (Signed)
Rounded on patient Notified by family at bedside that patient hadnt taken a breath in the past few minutes. Listened to Apical Heart beat- no heart beat auscultated. Second Nurse verifierDanae Chen, RN. Md notified as well as NP with palliative.  Family in the [place] at this time. Funeral Home to be determined.   Notified house supervisor- Kennyth Lose @1210   [facility] @ [phone]. Reference number: [id number] Representative Sharol Given.

## 2020-07-01 DEATH — deceased
